# Patient Record
Sex: Male | Born: 1935 | Race: White | Hispanic: No | Marital: Married | Smoking: Former smoker
Health system: Southern US, Community
[De-identification: ages and names within clinical notes are randomized; demographics above are authoritative.]

## PROBLEM LIST (undated history)

## (undated) DIAGNOSIS — N183 Chronic kidney disease, stage 3 unspecified: Secondary | ICD-10-CM

## (undated) DIAGNOSIS — I739 Peripheral vascular disease, unspecified: Secondary | ICD-10-CM

## (undated) DIAGNOSIS — I6529 Occlusion and stenosis of unspecified carotid artery: Secondary | ICD-10-CM

## (undated) DIAGNOSIS — E119 Type 2 diabetes mellitus without complications: Secondary | ICD-10-CM

## (undated) DIAGNOSIS — R0602 Shortness of breath: Secondary | ICD-10-CM

## (undated) DIAGNOSIS — Z9181 History of falling: Secondary | ICD-10-CM

## (undated) DIAGNOSIS — M1990 Unspecified osteoarthritis, unspecified site: Secondary | ICD-10-CM

## (undated) DIAGNOSIS — R001 Bradycardia, unspecified: Secondary | ICD-10-CM

## (undated) DIAGNOSIS — E875 Hyperkalemia: Secondary | ICD-10-CM

## (undated) DIAGNOSIS — R609 Edema, unspecified: Secondary | ICD-10-CM

## (undated) DIAGNOSIS — I639 Cerebral infarction, unspecified: Secondary | ICD-10-CM

## (undated) DIAGNOSIS — I679 Cerebrovascular disease, unspecified: Secondary | ICD-10-CM

## (undated) DIAGNOSIS — I10 Essential (primary) hypertension: Secondary | ICD-10-CM

## (undated) DIAGNOSIS — C801 Malignant (primary) neoplasm, unspecified: Secondary | ICD-10-CM

## (undated) DIAGNOSIS — E785 Hyperlipidemia, unspecified: Secondary | ICD-10-CM

## (undated) HISTORY — DX: Edema, unspecified: R60.9

## (undated) HISTORY — DX: Type 2 diabetes mellitus without complications: E11.9

## (undated) HISTORY — DX: Essential (primary) hypertension: I10

## (undated) HISTORY — PX: FEMORAL ARTERY STENT: SHX1583

## (undated) HISTORY — DX: Hyperkalemia: E87.5

## (undated) HISTORY — DX: Occlusion and stenosis of unspecified carotid artery: I65.29

## (undated) HISTORY — DX: History of falling: Z91.81

## (undated) HISTORY — DX: Bradycardia, unspecified: R00.1

## (undated) HISTORY — DX: Peripheral vascular disease, unspecified: I73.9

## (undated) HISTORY — DX: Hyperlipidemia, unspecified: E78.5

## (undated) HISTORY — DX: Cerebrovascular disease, unspecified: I67.9

---

## 1999-05-09 HISTORY — PX: CAROTID ENDARTERECTOMY: SUR193

## 1999-05-19 ENCOUNTER — Encounter: Payer: Self-pay | Admitting: *Deleted

## 1999-05-20 ENCOUNTER — Inpatient Hospital Stay (HOSPITAL_COMMUNITY): Admission: RE | Admit: 1999-05-20 | Discharge: 1999-05-21 | Payer: Self-pay | Admitting: *Deleted

## 1999-05-20 ENCOUNTER — Encounter (INDEPENDENT_AMBULATORY_CARE_PROVIDER_SITE_OTHER): Payer: Self-pay

## 2006-10-11 ENCOUNTER — Inpatient Hospital Stay (HOSPITAL_COMMUNITY): Admission: EM | Admit: 2006-10-11 | Discharge: 2006-10-12 | Payer: Self-pay | Admitting: Emergency Medicine

## 2006-10-11 ENCOUNTER — Encounter (INDEPENDENT_AMBULATORY_CARE_PROVIDER_SITE_OTHER): Payer: Self-pay | Admitting: Emergency Medicine

## 2007-01-22 ENCOUNTER — Ambulatory Visit: Payer: Self-pay | Admitting: Internal Medicine

## 2007-02-04 ENCOUNTER — Ambulatory Visit: Payer: Self-pay

## 2007-02-04 ENCOUNTER — Encounter: Payer: Self-pay | Admitting: Internal Medicine

## 2007-02-22 ENCOUNTER — Ambulatory Visit: Payer: Self-pay

## 2007-02-25 ENCOUNTER — Ambulatory Visit: Payer: Self-pay | Admitting: Cardiovascular Disease

## 2007-07-09 ENCOUNTER — Ambulatory Visit: Payer: Self-pay | Admitting: Internal Medicine

## 2007-08-15 ENCOUNTER — Ambulatory Visit: Payer: Self-pay | Admitting: Cardiovascular Disease

## 2007-08-15 ENCOUNTER — Ambulatory Visit: Payer: Self-pay

## 2007-08-22 ENCOUNTER — Ambulatory Visit: Payer: Self-pay

## 2007-09-12 ENCOUNTER — Ambulatory Visit: Payer: Self-pay | Admitting: Internal Medicine

## 2007-09-12 LAB — CONVERTED CEMR LAB
Calcium: 9.2 mg/dL (ref 8.4–10.5)
GFR calc Af Amer: 76 mL/min
GFR calc non Af Amer: 63 mL/min
Sodium: 138 meq/L (ref 135–145)

## 2007-09-13 ENCOUNTER — Ambulatory Visit: Payer: Self-pay | Admitting: Cardiology

## 2007-09-18 ENCOUNTER — Ambulatory Visit: Payer: Self-pay | Admitting: Internal Medicine

## 2007-09-18 ENCOUNTER — Ambulatory Visit: Payer: Self-pay | Admitting: Cardiology

## 2007-09-18 LAB — CONVERTED CEMR LAB
Calcium: 9.3 mg/dL (ref 8.4–10.5)
GFR calc Af Amer: 84 mL/min
GFR calc non Af Amer: 69 mL/min
Potassium: 5.6 meq/L — ABNORMAL HIGH (ref 3.5–5.1)
Sodium: 141 meq/L (ref 135–145)

## 2007-09-27 ENCOUNTER — Ambulatory Visit: Payer: Self-pay | Admitting: Internal Medicine

## 2007-09-27 LAB — CONVERTED CEMR LAB
Chloride: 101 meq/L (ref 96–112)
GFR calc Af Amer: 84 mL/min
GFR calc non Af Amer: 69 mL/min
Potassium: 5.3 meq/L — ABNORMAL HIGH (ref 3.5–5.1)

## 2007-10-09 ENCOUNTER — Ambulatory Visit: Payer: Self-pay | Admitting: Internal Medicine

## 2007-11-28 ENCOUNTER — Ambulatory Visit: Payer: Self-pay | Admitting: Internal Medicine

## 2007-12-18 ENCOUNTER — Ambulatory Visit: Payer: Self-pay | Admitting: Internal Medicine

## 2007-12-18 LAB — CONVERTED CEMR LAB
CO2: 29 meq/L (ref 19–32)
Calcium: 8.8 mg/dL (ref 8.4–10.5)
Chloride: 100 meq/L (ref 96–112)
GFR calc non Af Amer: 52 mL/min
Sodium: 136 meq/L (ref 135–145)

## 2008-02-28 ENCOUNTER — Ambulatory Visit: Payer: Self-pay | Admitting: Internal Medicine

## 2008-06-29 ENCOUNTER — Emergency Department (HOSPITAL_COMMUNITY): Admission: EM | Admit: 2008-06-29 | Discharge: 2008-06-29 | Payer: Self-pay | Admitting: Emergency Medicine

## 2008-09-02 ENCOUNTER — Ambulatory Visit: Payer: Self-pay

## 2008-09-02 ENCOUNTER — Encounter: Payer: Self-pay | Admitting: Internal Medicine

## 2008-09-22 DIAGNOSIS — I6529 Occlusion and stenosis of unspecified carotid artery: Secondary | ICD-10-CM

## 2008-09-22 DIAGNOSIS — I10 Essential (primary) hypertension: Secondary | ICD-10-CM | POA: Insufficient documentation

## 2008-09-22 DIAGNOSIS — I498 Other specified cardiac arrhythmias: Secondary | ICD-10-CM

## 2008-09-22 DIAGNOSIS — E785 Hyperlipidemia, unspecified: Secondary | ICD-10-CM

## 2008-09-22 DIAGNOSIS — E119 Type 2 diabetes mellitus without complications: Secondary | ICD-10-CM

## 2008-09-22 DIAGNOSIS — I679 Cerebrovascular disease, unspecified: Secondary | ICD-10-CM

## 2008-09-23 ENCOUNTER — Ambulatory Visit: Payer: Self-pay | Admitting: Cardiovascular Disease

## 2008-09-23 DIAGNOSIS — I70219 Atherosclerosis of native arteries of extremities with intermittent claudication, unspecified extremity: Secondary | ICD-10-CM | POA: Insufficient documentation

## 2008-10-08 ENCOUNTER — Ambulatory Visit: Payer: Self-pay | Admitting: Internal Medicine

## 2009-01-13 ENCOUNTER — Ambulatory Visit: Payer: Self-pay | Admitting: Internal Medicine

## 2009-01-13 ENCOUNTER — Ambulatory Visit: Payer: Self-pay

## 2009-01-13 DIAGNOSIS — I70229 Atherosclerosis of native arteries of extremities with rest pain, unspecified extremity: Secondary | ICD-10-CM | POA: Insufficient documentation

## 2009-01-14 ENCOUNTER — Encounter: Payer: Self-pay | Admitting: Internal Medicine

## 2009-01-15 ENCOUNTER — Encounter: Payer: Self-pay | Admitting: Cardiovascular Disease

## 2009-01-15 ENCOUNTER — Encounter (INDEPENDENT_AMBULATORY_CARE_PROVIDER_SITE_OTHER): Payer: Self-pay

## 2009-01-20 ENCOUNTER — Inpatient Hospital Stay (HOSPITAL_COMMUNITY): Admission: AD | Admit: 2009-01-20 | Discharge: 2009-01-21 | Payer: Self-pay | Admitting: Cardiovascular Disease

## 2009-01-20 ENCOUNTER — Ambulatory Visit: Payer: Self-pay | Admitting: Cardiovascular Disease

## 2009-01-27 ENCOUNTER — Ambulatory Visit: Payer: Self-pay

## 2009-01-29 ENCOUNTER — Ambulatory Visit: Payer: Self-pay

## 2009-01-29 ENCOUNTER — Encounter: Payer: Self-pay | Admitting: Cardiovascular Disease

## 2009-01-29 DIAGNOSIS — I714 Abdominal aortic aneurysm, without rupture: Secondary | ICD-10-CM | POA: Insufficient documentation

## 2009-02-01 ENCOUNTER — Encounter (INDEPENDENT_AMBULATORY_CARE_PROVIDER_SITE_OTHER): Payer: Self-pay

## 2009-02-01 ENCOUNTER — Ambulatory Visit: Payer: Self-pay | Admitting: Cardiovascular Disease

## 2009-02-11 ENCOUNTER — Inpatient Hospital Stay (HOSPITAL_COMMUNITY): Admission: AD | Admit: 2009-02-11 | Discharge: 2009-02-12 | Payer: Self-pay | Admitting: Cardiovascular Disease

## 2009-02-11 ENCOUNTER — Ambulatory Visit: Payer: Self-pay | Admitting: Cardiovascular Disease

## 2009-02-16 ENCOUNTER — Telehealth: Payer: Self-pay | Admitting: Cardiovascular Disease

## 2009-02-17 ENCOUNTER — Telehealth: Payer: Self-pay | Admitting: Cardiovascular Disease

## 2009-03-04 ENCOUNTER — Encounter (INDEPENDENT_AMBULATORY_CARE_PROVIDER_SITE_OTHER): Payer: Self-pay | Admitting: *Deleted

## 2009-03-08 ENCOUNTER — Encounter: Payer: Self-pay | Admitting: Cardiovascular Disease

## 2009-03-09 ENCOUNTER — Ambulatory Visit: Payer: Self-pay | Admitting: Cardiovascular Disease

## 2009-03-09 ENCOUNTER — Ambulatory Visit: Payer: Self-pay

## 2009-05-21 ENCOUNTER — Ambulatory Visit: Payer: Self-pay | Admitting: Internal Medicine

## 2009-05-21 DIAGNOSIS — R609 Edema, unspecified: Secondary | ICD-10-CM | POA: Insufficient documentation

## 2009-06-03 ENCOUNTER — Ambulatory Visit: Payer: Self-pay

## 2009-06-03 ENCOUNTER — Ambulatory Visit: Payer: Self-pay | Admitting: Cardiology

## 2009-06-03 ENCOUNTER — Ambulatory Visit (HOSPITAL_COMMUNITY): Admission: RE | Admit: 2009-06-03 | Discharge: 2009-06-03 | Payer: Self-pay | Admitting: Internal Medicine

## 2009-06-17 ENCOUNTER — Ambulatory Visit: Payer: Self-pay | Admitting: Internal Medicine

## 2009-06-29 ENCOUNTER — Encounter: Payer: Self-pay | Admitting: Cardiovascular Disease

## 2009-09-09 ENCOUNTER — Ambulatory Visit: Payer: Self-pay | Admitting: Internal Medicine

## 2009-09-10 LAB — CONVERTED CEMR LAB
Calcium: 9 mg/dL (ref 8.4–10.5)
Chloride: 105 meq/L (ref 96–112)
Creatinine, Ser: 1.1 mg/dL (ref 0.4–1.5)
Potassium: 5.1 meq/L (ref 3.5–5.1)
Pro B Natriuretic peptide (BNP): 57.3 pg/mL (ref 0.0–100.0)
Sodium: 140 meq/L (ref 135–145)

## 2009-09-15 ENCOUNTER — Ambulatory Visit: Payer: Self-pay | Admitting: Internal Medicine

## 2009-09-20 ENCOUNTER — Encounter: Payer: Self-pay | Admitting: Cardiovascular Disease

## 2009-09-21 ENCOUNTER — Ambulatory Visit: Payer: Self-pay

## 2009-09-21 ENCOUNTER — Ambulatory Visit: Payer: Self-pay | Admitting: Cardiovascular Disease

## 2009-09-21 LAB — CONVERTED CEMR LAB
CO2: 30 meq/L (ref 19–32)
Chloride: 100 meq/L (ref 96–112)
Potassium: 6 meq/L — ABNORMAL HIGH (ref 3.5–5.1)
Pro B Natriuretic peptide (BNP): 58.8 pg/mL (ref 0.0–100.0)
Sodium: 139 meq/L (ref 135–145)

## 2009-10-06 ENCOUNTER — Telehealth: Payer: Self-pay | Admitting: Cardiology

## 2009-10-11 ENCOUNTER — Telehealth: Payer: Self-pay | Admitting: Internal Medicine

## 2009-10-18 ENCOUNTER — Telehealth: Payer: Self-pay | Admitting: Internal Medicine

## 2009-11-07 ENCOUNTER — Emergency Department (HOSPITAL_COMMUNITY): Admission: EM | Admit: 2009-11-07 | Discharge: 2009-11-07 | Payer: Self-pay | Admitting: Emergency Medicine

## 2010-02-01 ENCOUNTER — Encounter: Payer: Self-pay | Admitting: Cardiovascular Disease

## 2010-02-02 ENCOUNTER — Encounter: Payer: Self-pay | Admitting: Cardiovascular Disease

## 2010-02-02 ENCOUNTER — Ambulatory Visit: Payer: Self-pay

## 2010-03-29 ENCOUNTER — Ambulatory Visit: Payer: Self-pay | Admitting: Internal Medicine

## 2010-04-04 ENCOUNTER — Ambulatory Visit: Payer: Self-pay | Admitting: Cardiovascular Disease

## 2010-04-04 ENCOUNTER — Ambulatory Visit: Payer: Self-pay

## 2010-04-07 ENCOUNTER — Telehealth (INDEPENDENT_AMBULATORY_CARE_PROVIDER_SITE_OTHER): Payer: Self-pay | Admitting: Radiology

## 2010-04-11 ENCOUNTER — Encounter (HOSPITAL_COMMUNITY)
Admission: RE | Admit: 2010-04-11 | Discharge: 2010-06-07 | Payer: Self-pay | Source: Home / Self Care | Attending: Internal Medicine | Admitting: Internal Medicine

## 2010-04-11 ENCOUNTER — Ambulatory Visit: Payer: Self-pay

## 2010-04-11 ENCOUNTER — Encounter: Payer: Self-pay | Admitting: Cardiovascular Disease

## 2010-04-11 ENCOUNTER — Ambulatory Visit: Payer: Self-pay | Admitting: Cardiovascular Disease

## 2010-04-28 ENCOUNTER — Ambulatory Visit: Payer: Self-pay | Admitting: Internal Medicine

## 2010-06-05 LAB — CONVERTED CEMR LAB
BUN: 23 mg/dL (ref 6–23)
BUN: 26 mg/dL — ABNORMAL HIGH (ref 6–23)
CO2: 20 meq/L (ref 19–32)
CO2: 29 meq/L (ref 19–32)
CO2: 34 meq/L — ABNORMAL HIGH (ref 19–32)
Calcium: 9.5 mg/dL (ref 8.4–10.5)
Calcium: 9.6 mg/dL (ref 8.4–10.5)
Calcium: 9.6 mg/dL (ref 8.4–10.5)
Chloride: 100 meq/L (ref 96–112)
Chloride: 97 meq/L (ref 96–112)
Cholesterol, target level: 200 mg/dL
Creatinine, Ser: 0.98 mg/dL (ref 0.40–1.50)
Creatinine, Ser: 1.2 mg/dL (ref 0.4–1.5)
GFR calc non Af Amer: 44.75 mL/min (ref >60)
Glucose, Bld: 146 mg/dL — ABNORMAL HIGH (ref 70–99)
Glucose, Bld: 99 mg/dL (ref 70–99)
LDL Goal: 70 mg/dL
Potassium: 4 meq/L (ref 3.5–5.1)
Potassium: 4.6 meq/L (ref 3.5–5.1)
Potassium: 5.6 meq/L — ABNORMAL HIGH (ref 3.5–5.1)
Pro B Natriuretic peptide (BNP): 49.6 pg/mL (ref 0.0–100.0)
Pro B Natriuretic peptide (BNP): 71 pg/mL (ref 0.0–100.0)
Sodium: 138 meq/L (ref 135–145)
Sodium: 140 meq/L (ref 135–145)

## 2010-06-07 NOTE — Assessment & Plan Note (Signed)
Summary: per check out   Primary Provider:  Dr Evlyn Kanner   History of Present Illness: [name] is 77 year old male with history of severe hypertension, hyperlipidemia, diabetes, right bundle-branch block with chronic bradycardia as well as peripheral vascular disease, status post right carotid endarterectomy and bilateral common iliac stenting  CT angiogram has been negative for renal artery stenosis.  His therapy has been limited by his bradycardia as well as hyperkalemia and inability to tolerate several antihypertensives most recently cardura which caused symptomatic hypotension.  Feels pretty good. SBP at home now up in the 150 range. Compliant with all meds  No orthopnea, PND. No CP. HR remains in 50s on monitor at thome but no syncope or presyncope. Legs feel numb/tingly but no significant claudication. Still playing golf 2-3x/week. Edema much improved on metolazone which he takes about 3x/week.   Current Medications (verified): 1)  Glipizide 10 Mg Xr24h-Tab (Glipizide) .... Take 1 Tablet By Mouth Once A Day 2)  Aspirin Ec 325 Mg Tbec (Aspirin) .... Take One Tablet By Mouth Once Daily 3)  Lantus 100 Unit/ml Soln (Insulin Glargine) .... As Directed 4)  Vytorin 10-40 Mg Tabs (Ezetimibe-Simvastatin) .... Take One Tablet By Mouth Dailyat Bedtime 5)  Clonidine Hcl 0.2 Mg Tabs (Clonidine Hcl) .[name].. 1 Tab Two Times A Day 6)  Furosemide 40 Mg Tabs (Furosemide) .... Take One Tablet By Mouth Daily. 7)  Niaspan 1000 Mg Cr-Tabs (Niacin (Antihyperlipidemic)) .... Take 1 Tablet By Mouth Two Times A Day 8)  Amlodipine Besylate 10 Mg Tabs (Amlodipine Besylate) .... Take One Tablet By Mouth Daily 9)  Plavix 75 Mg Tabs (Clopidogrel Bisulfate) .... Take One Tablet By Mouth Daily 10)  Hydralazine Hcl 25 Mg Tabs (Hydralazine Hcl) .... Take One Tablet By Mouth Three Times A Day 11)  Metformin Hcl 1000 Mg Tabs (Metformin Hcl) .... Take 1 Tablet By Mouth Two Times A Day 12)  Lyrica 75 Mg Caps (Pregabalin) .[name]..  1 By Mouth Two Times A Day 13)  Metolazone 2.5 Mg Tabs (Metolazone) .... Take By Mouth As Needed  Allergies (verified): 1)  ! Cardura  Past History:  Past Medical History: Last updated: 06/17/2009 CAROTID STENOSIS (ICD-433.10)    --s/p R carotid endarterectomy   --u/s 4/10  L 40-59% R 0-39%  (stable) HYPERTENSION (ICD-401.9)-Refractory   --CT angio negative for renal artery stenosis   --unable to tolerate minoxidil or doxazosin due to low BP. can't tolerate spironlactone due to hyperkalemia Edema    --echo 1/11: EF 60% grade 1 diastolic dysfunction PAD    --s/p bilateral iliac stenting CEREBROVASCULAR DISEASE (ICD-437.9) HYPERLIPIDEMIA (ICD-272.4) BRADYCARDIA (ICD-427.89) DIABETES MELLITUS, TYPE II (ICD-250.00) Myoview normal 2008 h/o Hyperkalemia  Review of Systems       As per HPI and past medical history; otherwise all systems negative.   Vital Signs:  Patient profile:   77 year old male Height:      71 inches Weight:      184 pounds BMI:     25.76 Pulse rate:   51 / minute Resp:     16 per minute BP sitting:   172 / 52  (right arm)  Vitals Entered By: Marrion Coy, CNA (March 29, 2010 10:50 AM)  Physical Exam  General:  Pt is alert and oriented, in no acute distress. HEENT: normal Neck: normal carotid upstrokes with bilateral bruits,R CEA scar  JVP normal Lungs: CTA CV: Bradycardia regular without murmur+ s4 Abd: soft, NT, positive BS, no bruit, no organomegaly Ext: 1+ bilateral pretibial  edema.  right posterior tibial pulse 2+, other pedal pulses nonpalpable Skin: warm and dry without rash Neuro Ax0 X3. moves all 4 extremities without difficulty   affect pleasant    Impression & Recommendations:  Problem # 1:  EDEMA (ICD-782.3) Improved with metolzaone but still with some fluid on board. Suspect mainly due to venous insuffieincy. Place compression strockings. Check BMET and BNP to make sure renal function stable on metolazone.   Problem # 2:   HYPERTENSION (ICD-401.9) BP elevated. Options limited. Increase hydralazine to 50 three times a day.   Problem # 3:  PAD Stable. Given PAD will need screening for ischemic heart disesase. Will proceed with TM/myoview. May need to switch to [facility] if limited by PAD or can't get to target HR. Will try to walk first.  Other Orders: EKG w/ Interpretation (93000) Nuclear Stress Test (Nuc Stress Test) TLB-BMP (Basic Metabolic Panel-BMET) (80048-METABOL) TLB-BNP (B-Natriuretic Peptide) (83880-BNPR)  Patient Instructions: 1)  Labwork today: bmet/bnp (782.3). 2)  Increase Hydralazine to 50mg  three times a day. 3)  Please obtain and wear a pair of Over the Counter compression socks. 4)  Your physician has requested that you have an exercise stress myoview.  For further information please visit [email or web address]  Please follow instruction sheet, as given. 5)  Your physician recommends that you schedule a follow-up appointment in: 4-6 weeks.

## 2010-06-07 NOTE — Assessment & Plan Note (Signed)
Summary: PER CHECK OUT/SF   Primary Provider:  Dr Evlyn Kanner  CC:  SOB occasionally.  History of Present Illness: [name] is 77 year old male with history of severe hypertension, hyperlipidemia, diabetes, right bundle-branch block with chronic bradycardia as well as peripheral vascular disease, status post right carotid endarterectomy and bilateral common iliac stenting  CT angiogram has been negative for renal artery stenosis.  His therapy has been limited by his bradycardia as well as hyperkalemia and inability to tolerate several antihypertensives most recently cardura which caused symptomatic hypotension.  Has had total resolution of claudication but still has numbness in his legs.  SBP at home ranging 140 to 170. No CP. Occasionally SOB when laying back in easy chair but not while actice. Still playing golf without problems when weather cooperates. No syncope or presyncope.  Having worse problems with edema. Hyzaar stopped prior to angio a couple months ago and not restarted.   Current Medications (verified): 1)  Glipizide 10 Mg Xr24h-Tab (Glipizide) .... Take 1 Tablet By Mouth Once A Day 2)  Aspirin Ec 325 Mg Tbec (Aspirin) .... Take One Tablet By Mouth Once Daily 3)  Lantus 100 Unit/ml Soln (Insulin Glargine) .... As Directed 4)  Vytorin 10-40 Mg Tabs (Ezetimibe-Simvastatin) .... Take One Tablet By Mouth Dailyat Bedtime 5)  Clonidine Hcl 0.2 Mg Tabs (Clonidine Hcl) .[name].. 1 Tab Two Times A Day 6)  Furosemide 20 Mg Tabs (Furosemide) .... Take One Tablet By Mouth Daily. 7)  Niaspan 1000 Mg Cr-Tabs (Niacin (Antihyperlipidemic)) .... Take 1 Tablet By Mouth Two Times A Day 8)  Amlodipine Besylate 10 Mg Tabs (Amlodipine Besylate) .... Take One Tablet By Mouth Daily 9)  Plavix 75 Mg Tabs (Clopidogrel Bisulfate) .... Take One Tablet By Mouth Daily 10)  Hydralazine Hcl 25 Mg Tabs (Hydralazine Hcl) .... Take One Tablet By Mouth Three Times A Day 11)  Aspirin 81 Mg Tbec (Aspirin) .... Take One  Tablet By Mouth Daily  Allergies (verified): 1)  ! Cardura  Past History:  Past Medical History: CAROTID STENOSIS (ICD-433.10)    --s/p R carotid endarterectomy   --u/s 4/10  L 40-59% R 0-39%  (stable) HYPERTENSION (ICD-401.9)-Refractory   --CT angio negative for renal artery stenosis   --unable to tolerate minoxidil or doxazosin due to low BP. can't tolerate spironlactone due to hyperkalemia PAD    --s/p bilateral iliac stenting CEREBROVASCULAR DISEASE (ICD-437.9) HYPERLIPIDEMIA (ICD-272.4) BRADYCARDIA (ICD-427.89) DIABETES MELLITUS, TYPE II (ICD-250.00) Myoview normal 2008 h/o Hyperkalemia  Review of Systems       As per HPI and past medical history; otherwise all systems negative.   Vital Signs:  Patient profile:   77 year old male Height:      71 inches Weight:      191 pounds BMI:     26.74 Pulse rate:   48 / minute BP sitting:   204 / 64  (right arm) Cuff size:   regular  Vitals Entered By: Hardin Negus, RMA (May 21, 2009 9:59 AM)  Physical Exam  General:  Pt is alert and oriented, in no acute distress. HEENT: normal Neck: normal carotid upstrokes with bilateral bruits, JVP 7. Lungs: CTA CV: PMI non-displaced. RRR/ 2/6 SEM RSB. no gallop Abd: soft, NT, positive BS, no bruit, no organomegaly Ext: 2+ bilateral pedal edema.no cyanosis or clubbin Skin: warm and dry without rash    Impression & Recommendations:  Problem # 1:  HYPERTENSION (ICD-401.9) BP markedly elevated. Will restart Hyzaar and see how he does. Suspect he also has  white-coat compenent. Bring him back in 1 month and if BP still up consider ambulatory BP monitor. May try to restart very low-dose cardura in future.  Orders: TLB-BMP (Basic Metabolic Panel-BMET) (80048-METABOL) TLB-BNP (B-Natriuretic Peptide) (83880-BNPR)  Problem # 2:  EDEMA (ICD-782.3) Restart Hyzaar. Check echo and BNP. Likely has significant diastolic dysfunction.  Orders: TLB-BMP (Basic Metabolic Panel-BMET)  (80048-METABOL) TLB-BNP (B-Natriuretic Peptide) (83880-BNPR) Echocardiogram (Echo)  Problem # 3:  BRADYCARDIA (ICD-427.89) Remains asymptomatic. Has significant underlying conduction disease and will likely need pacer at some point in future.  Other Orders: EKG w/ Interpretation (93000)  Patient Instructions: 1)  Start Hyzaar 100/25mg  daily 2)  Labs today 3)  Your physician has requested that you have an echocardiogram.  Echocardiography is a painless test that uses sound waves to create images of your heart. It provides your doctor with information about the size and shape of your heart and how well your heart's chambers and valves are working.  This procedure takes approximately one hour. There are no restrictions for this procedure. 4)  Follow up in 46month Prescriptions: HYZAAR 100-25 MG TABS (LOSARTAN POTASSIUM-HCTZ) Take 1 tablet by mouth once a day  #90 x 3   Entered by:   [name], RN   Authorized by:   Dolores Patty, MD, [facility]   Signed by:   [name], RN on 05/21/2009   Method used:   Faxed to ...       [facility] [employer] education [facility])       [address] [address]       [place], [place]  [address]       Ph: [phone]       Fax: [phone]   RxID:   [id number] HYZAAR 100-25 MG TABS (LOSARTAN POTASSIUM-HCTZ) Take 1 tablet by mouth once a day  #30 x 1   Entered by:   [name], RN   Authorized by:   Dolores Patty, MD, [facility]   Signed by:   [name], RN on 05/21/2009   Method used:   Electronically to        [id number] (retail)       [address]       [place], [place]  [address]       Ph: [phone] or [phone]       Fax: [phone]   RxID:   [id number]   Prevention & Chronic Care Immunizations   Influenza vaccine: Not documented    Tetanus booster: Not documented    Pneumococcal vaccine: Not documented    H. zoster vaccine: Not documented  Colorectal Screening   Hemoccult: Not documented     Colonoscopy: Not documented  Other Screening   PSA: Not documented   Smoking status: Not documented  Diabetes Mellitus   HgbA1C: Not documented    Eye exam: Not documented    Foot exam: Not documented   High risk foot: Not documented   Foot care education: Not documented    Urine microalbumin/creatinine ratio: Not documented  Lipids   Total Cholesterol: Not documented   LDL: Not documented   LDL Direct: Not documented   HDL: Not documented   Triglycerides: Not documented    SGOT (AST): Not documented   SGPT (ALT): Not documented   Alkaline phosphatase: Not documented   Total bilirubin: Not documented  Hypertension   Last Blood Pressure: 204 / 64  (05/21/2009)   Serum creatinine: 1.6  (01/13/2009)   Serum  potassium 5.6  (01/13/2009)  Self-Management Support :    Diabetes self-management support: Not documented    Hypertension self-management support: Not documented    Lipid self-management support: Not documented    Appended Document: Orders Update    Clinical Lists Changes  Orders: Added new Test order of T- * Misc. Laboratory test [phone]) - Signed

## 2010-06-07 NOTE — Assessment & Plan Note (Signed)
Summary: per check out/sf   Primary Provider:  Dr Evlyn Kanner  CC:  SOB occasionally.  History of Present Illness: [name] is 77 year old male with history of severe hypertension, hyperlipidemia, diabetes, right bundle-branch block with chronic bradycardia as well as peripheral vascular disease, status post right carotid endarterectomy and bilateral common iliac stenting  CT angiogram has been negative for renal artery stenosis.  His therapy has been limited by his bradycardia as well as hyperkalemia and inability to tolerate several antihypertensives most recently cardura which caused symptomatic hypotension.  At last visit he had significant volume overload and was quite hypertensive. We restarted hyzaar and got echo and bnp. BNP only 71. Echo EF 60% with grade 1 diastolic dysfx. Edema much improved. Denies CP, SOB. Playing golf 2-3x/week. Claudication resolved but still with numb feet. BP at home running 125-140.  Current Medications (verified): 1)  Glipizide 10 Mg Xr24h-Tab (Glipizide) .... Take 1 Tablet By Mouth Once A Day 2)  Aspirin Ec 325 Mg Tbec (Aspirin) .... Take One Tablet By Mouth Once Daily 3)  Lantus 100 Unit/ml Soln (Insulin Glargine) .... As Directed 4)  Vytorin 10-40 Mg Tabs (Ezetimibe-Simvastatin) .... Take One Tablet By Mouth Dailyat Bedtime 5)  Clonidine Hcl 0.2 Mg Tabs (Clonidine Hcl) .[name].. 1 Tab Two Times A Day 6)  Furosemide 20 Mg Tabs (Furosemide) .... Take One Tablet By Mouth Daily. 7)  Niaspan 1000 Mg Cr-Tabs (Niacin (Antihyperlipidemic)) .... Take 1 Tablet By Mouth Two Times A Day 8)  Amlodipine Besylate 10 Mg Tabs (Amlodipine Besylate) .... Take One Tablet By Mouth Daily 9)  Plavix 75 Mg Tabs (Clopidogrel Bisulfate) .... Take One Tablet By Mouth Daily 10)  Hydralazine Hcl 25 Mg Tabs (Hydralazine Hcl) .... Take One Tablet By Mouth Three Times A Day 11)  Hyzaar 100-25 Mg Tabs (Losartan Potassium-Hctz) .... Take 1 Tablet By Mouth Once A Day 12)  Metformin Hcl 1000 Mg  Tabs (Metformin Hcl) .... Once Daily  Allergies (verified): 1)  ! Cardura  Past History:  Past Medical History: CAROTID STENOSIS (ICD-433.10)    --s/p R carotid endarterectomy   --u/s 4/10  L 40-59% R 0-39%  (stable) HYPERTENSION (ICD-401.9)-Refractory   --CT angio negative for renal artery stenosis   --unable to tolerate minoxidil or doxazosin due to low BP. can't tolerate spironlactone due to hyperkalemia Edema    --echo 1/11: EF 60% grade 1 diastolic dysfunction PAD    --s/p bilateral iliac stenting CEREBROVASCULAR DISEASE (ICD-437.9) HYPERLIPIDEMIA (ICD-272.4) BRADYCARDIA (ICD-427.89) DIABETES MELLITUS, TYPE II (ICD-250.00) Myoview normal 2008 h/o Hyperkalemia  Review of Systems       As per HPI and past medical history; otherwise all systems negative.   Vital Signs:  Patient profile:   77 year old male Height:      71 inches Weight:      191 pounds BMI:     26.74 Pulse rate:   60 / minute BP sitting:   154 / 60  (left arm) Cuff size:   regular  Vitals Entered By: Hardin Negus, RMA (June 17, 2009 9:32 AM)  Physical Exam  General:  Pt is alert and oriented, in no acute distress. HEENT: normal Neck: normal carotid upstrokes with bilateral bruits, R CEA scarJVP  flat Lungs: CTA CV: PMI non-displaced. RRR/ 2/6 SEM RSB. no gallop Abd: soft, NT, positive BS, no bruit, no organomegaly Ext: 1+ bilateral pedal edema.no cyanosis or clubbing no sores Skin: warm and dry without rash    Impression & Recommendations:  Problem # 1:  HYPERTENSION (ICD-401.9) SBP ranging 160-180 here in clinic. 125-140 at home. Wil get ambulatory BP monitor to get better feel of actualt BPs. If SBP routinely over 135 would consider restarting doxazosin at 0.5 mg at bedtime to see if he can tolerate.  Problem # 2:  EDEMA (ICD-782.3) Improved. Continue current regiment.   Problem # 3:  CAROTID STENOSIS (ICD-433.10) Stable. Asymptomatic. Due for u/s at next visit.   Other  Orders: Ambulatory Bloodpressure Cuff (Amb BP Cuff)  Patient Instructions: 1)  Ambulatory BP cuff 2)  Follow up in 3 months

## 2010-06-07 NOTE — Progress Notes (Signed)
Summary: needs new dose called in  Phone Note Refill Request Message from:  Patient on [facility]  Refills Requested: Medication #1:  FUROSEMIDE 40 MG TABS Take one tablet by mouth daily. he needs it sent in because it was a dose change  Initial call taken by: [name],  October 11, 2009 3:30 PM  Follow-up for Phone Call        already sent to the pharmacy. 10-06-09 Follow-up by: Kem Parkinson,  October 11, 2009 3:51 PM

## 2010-06-07 NOTE — Letter (Signed)
Summary: [facility] Office Note  [facility] Office Note   Imported By: Roderic Ovens 07/19/2009 11:20:28  _____________________________________________________________________  External Attachment:    Type:   Image     Comment:   External Document

## 2010-06-07 NOTE — Progress Notes (Signed)
Summary: nuc pre-procedure  Phone Note Outgoing Call   Call placed by: Harlow Asa CNMT Call placed to: Patient Reason for Call: Confirm/change Appt Summary of Call: Left message with information on Myoview Information Sheet (see scanned document for details).      Nuclear Med Background Indications for Stress Test: Evaluation for Ischemia   History: Echo, Myocardial Perfusion Study, Stents  History Comments: 1/11 Echo - nml/ EF=60-65%; MPI -nml EF=60%; Stent-Iliac     Nuclear Pre-Procedure Cardiac Risk Factors: Carotid Disease, History of Smoking, Hypertension, IDDM Type 2, Lipids, PVD, RBBB Height (in): 71 Tech Comments: Rt. CEA   Appended Document: Cardiology Nuclear Testing ok

## 2010-06-07 NOTE — Assessment & Plan Note (Signed)
Summary: f80m   Visit Type:  Follow-up Primary Provider:  Dr Evlyn Kanner  CC:  Left foot pain.  History of Present Illness: [name] returns for followup of his peripheral arterial disease today. He is a 77 year old gentleman with extensive PAD. He underwent iliac stenting in 2010, and he has residual SFA and popliteal stenosis. Previous ABIs were 71% on the right and 52% on the left. Today's study showed ABI's of 58% on the right and 50% on the left.  He has bilateral calf pain with ambulation, stable over time. He is able to walk about 200 yards without resting. He can play golf without limitation, even when it is 'cart path only.' His main complaint today is severe pain in the left first toe (1st MT joint), which has been painful and swollen for about 2 months. No ulcers or other complaints.    Current Medications (verified): 1)  Glipizide 10 Mg Xr24h-Tab (Glipizide) .... Take 1 Tablet By Mouth Once A Day 2)  Aspirin Ec 325 Mg Tbec (Aspirin) .... Take One Tablet By Mouth Once Daily 3)  Lantus 100 Unit/ml Soln (Insulin Glargine) .... As Directed 4)  Vytorin 10-40 Mg Tabs (Ezetimibe-Simvastatin) .... Take One Tablet By Mouth Dailyat Bedtime 5)  Clonidine Hcl 0.2 Mg Tabs (Clonidine Hcl) .[name].. 1 Tab Two Times A Day 6)  Furosemide 40 Mg Tabs (Furosemide) .... Take One Tablet By Mouth Daily. 7)  Niaspan 1000 Mg Cr-Tabs (Niacin (Antihyperlipidemic)) .... Take 1 Tablet By Mouth Two Times A Day 8)  Amlodipine Besylate 10 Mg Tabs (Amlodipine Besylate) .... Take One Tablet By Mouth Daily 9)  Plavix 75 Mg Tabs (Clopidogrel Bisulfate) .... Take One Tablet By Mouth Daily 10)  Hydralazine Hcl 50 Mg Tabs (Hydralazine Hcl) .... Take One Tablet By Mouth Three Times A Day 11)  Metformin Hcl 1000 Mg Tabs (Metformin Hcl) .... Take 1 Tablet By Mouth Two Times A Day 12)  Lyrica 75 Mg Caps (Pregabalin) .[name].. 1 By Mouth Two Times A Day 13)  Metolazone 2.5 Mg Tabs (Metolazone) .... Take By Mouth As  Needed  Allergies: 1)  ! Cardura  Past History:  Past medical history reviewed for relevance to current acute and chronic problems.  Past Medical History: Reviewed history from 06/17/2009 and no changes required. CAROTID STENOSIS (ICD-433.10)    --s/p R carotid endarterectomy   --u/s 4/10  L 40-59% R 0-39%  (stable) HYPERTENSION (ICD-401.9)-Refractory   --CT angio negative for renal artery stenosis   --unable to tolerate minoxidil or doxazosin due to low BP. can't tolerate spironlactone due to hyperkalemia Edema    --echo 1/11: EF 60% grade 1 diastolic dysfunction PAD    --s/p bilateral iliac stenting CEREBROVASCULAR DISEASE (ICD-437.9) HYPERLIPIDEMIA (ICD-272.4) BRADYCARDIA (ICD-427.89) DIABETES MELLITUS, TYPE II (ICD-250.00) Myoview normal 2008 h/o Hyperkalemia  Review of Systems       Negative except as per HPI   Vital Signs:  Patient profile:   77 year old male Height:      71 inches Weight:      189 pounds BMI:     26.46 Pulse rate:   54 / minute Pulse rhythm:   irregular Resp:     18 per minute BP sitting:   200 / 78  (left arm) Cuff size:   large  Vitals Entered By: Vikki Ports (April 04, 2010 2:40 PM)  Serial Vital Signs/Assessments:  Time      Position  BP       Pulse  Resp  Temp  By           R Arm     198/78                         Vikki Ports   Physical Exam  General:  Pt is alert and oriented, in no acute distress. HEENT: normal Neck: normal carotid upstrokes without bruits, JVP normal Lungs: CTA CV: RRR without murmur or gallop Abd: soft, NT, positive BS, no bruit, no organomegaly Ext: no clubbing, cyanosis, or edema. femoral pulses 2+=, pedals not palpable. 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 There is no evidence of inflow disease after iliac stenting, but the pt does have bilateral infrainguinal disease. With stable intermittent claudication, I have recommended continued observation and medical therapy. His right foot pain is consistent with gout as it is localized to the first MT joint. I called Dr Evlyn Kanner who kindly offered to give [name] a depomedrol injection - he will go to the [facility] this afternoon.  He will have followup ABI's and duplex in one year.  Problem # 2:  CAROTID STENOSIS (ICD-433.10) Stable, mild-moderate carotid disease. Due for followup in May 2012.  His updated medication list for this problem includes:    Aspirin Ec 325 Mg Tbec (Aspirin) .[name]... Take one tablet by mouth once daily    Plavix 75 Mg Tabs (Clopidogrel bisulfate) .[name]... Take one tablet by mouth daily  Problem # 3:  HYPERTENSION (ICD-401.9) BP labile and difficult to manage because of med intolerances. He reports BP was 145/70 this am. Dr Gala Romney just increased hydralazine last week. No changes made today.  His updated medication list for this problem includes:    Aspirin Ec 325 Mg Tbec (Aspirin) .[name]... Take one tablet by mouth once daily    Clonidine Hcl 0.2 Mg Tabs (Clonidine hcl) .[name]... 1 tab two times a day    Furosemide 40 Mg Tabs (Furosemide) .[name]... Take one tablet by mouth daily.    Amlodipine Besylate 10 Mg Tabs (Amlodipine besylate) .[name]... Take one tablet by mouth daily    Hydralazine Hcl 50 Mg Tabs (Hydralazine hcl) .[name]... Take one tablet by mouth three times a day    Metolazone 2.5 Mg Tabs (Metolazone) .[name]... Take by mouth as needed  Patient Instructions: 1)  Your physician recommends that you schedule a follow-up appointment in: 1 year with Dr. Excell Seltzer 2)  Your physician recommends that you continue on your current medications as directed. Please refer to  the Current Medication list given to you today. 3)  Your physician has requested that you have an ankle brachial index (ABI). During this test an ultrasound and blood pressure cuff are used to evaluate the arteries that supply the arms and legs with blood. Allow thirty minutes for this exam. There are no restrictions or special instructions.  IN 1 YEAR 4)  Your physician has requested that you have a carotid duplex. This test is an ultrasound of the carotid arteries in your neck. It looks at blood flow through these arteries that supply the brain with blood. Allow one hour for this exam. There are  no restrictions or special instructions.  IN MAY 2012

## 2010-06-07 NOTE — Assessment & Plan Note (Signed)
Summary: 3 MONTH ROV/SL   Visit Type:  Follow-up Primary Provider:  Dr Evlyn Kanner  CC:  edema.  History of Present Illness: [name] is 77 year old male with history of severe hypertension, hyperlipidemia, diabetes, right bundle-branch block with chronic bradycardia as well as peripheral vascular disease, status post right carotid endarterectomy and bilateral common iliac stenting  CT angiogram has been negative for renal artery stenosis.  His therapy has been limited by his bradycardia as well as hyperkalemia and inability to tolerate several antihypertensives most recently cardura which caused symptomatic hypotension.  Returns for f/u. Saw Dr. Evlyn Kanner about 3 or 4 months ago and was switched from Hyzaar to Tekturna/HCT and Lyrica was added. Says he is swelling again. Also has rash on his thighs which comes and goes.   SBP at home  120-130s mostly. No orthopnea, PND. No CP. HR remains in 50s on monitor at thome but no syncope or presyncope. Legs feel numb/tingly but no claudication. Still playing golf 2x/week  Current Medications (verified): 1)  Glipizide 10 Mg Xr24h-Tab (Glipizide) .... Take 1 Tablet By Mouth Once A Day 2)  Aspirin Ec 325 Mg Tbec (Aspirin) .... Take One Tablet By Mouth Once Daily 3)  Lantus 100 Unit/ml Soln (Insulin Glargine) .... As Directed 4)  Vytorin 10-40 Mg Tabs (Ezetimibe-Simvastatin) .... Take One Tablet By Mouth Dailyat Bedtime 5)  Clonidine Hcl 0.2 Mg Tabs (Clonidine Hcl) .[name].. 1 Tab Two Times A Day 6)  Furosemide 20 Mg Tabs (Furosemide) .... Take One Tablet By Mouth Daily. 7)  Niaspan 1000 Mg Cr-Tabs (Niacin (Antihyperlipidemic)) .... Take 1 Tablet By Mouth Two Times A Day 8)  Amlodipine Besylate 10 Mg Tabs (Amlodipine Besylate) .... Take One Tablet By Mouth Daily 9)  Plavix 75 Mg Tabs (Clopidogrel Bisulfate) .... Take One Tablet By Mouth Daily 10)  Hydralazine Hcl 25 Mg Tabs (Hydralazine Hcl) .... Take One Tablet By Mouth Three Times A Day 11)  Tekturna Hct  300-12.5 Mg Tabs (Aliskiren-Hydrochlorothiazide) .[name].. 1 By Mouth Daily 12)  Metformin Hcl 1000 Mg Tabs (Metformin Hcl) .... Once Daily 13)  Lyrica 75 Mg Caps (Pregabalin) .[name].. 1 By Mouth Two Times A Day  Allergies (verified): 1)  ! Cardura  Past History:  Past Medical History: Last updated: 06/17/2009 CAROTID STENOSIS (ICD-433.10)    --s/p R carotid endarterectomy   --u/s 4/10  L 40-59% R 0-39%  (stable) HYPERTENSION (ICD-401.9)-Refractory   --CT angio negative for renal artery stenosis   --unable to tolerate minoxidil or doxazosin due to low BP. can't tolerate spironlactone due to hyperkalemia Edema    --echo 1/11: EF 60% grade 1 diastolic dysfunction PAD    --s/p bilateral iliac stenting CEREBROVASCULAR DISEASE (ICD-437.9) HYPERLIPIDEMIA (ICD-272.4) BRADYCARDIA (ICD-427.89) DIABETES MELLITUS, TYPE II (ICD-250.00) Myoview normal 2008 h/o Hyperkalemia  Vital Signs:  Patient profile:   77 year old male Height:      71 inches Weight:      194 pounds BMI:     27.16 Pulse rate:   46 / minute Resp:     16 per minute BP sitting:   160 / 58  (right arm)  Vitals Entered By: Marrion Coy, CNA (Sep 09, 2009 8:02 AM)  Physical Exam  General:  Pt is alert and oriented, in no acute distress. HEENT: normal Neck: normal carotid upstrokes with bilateral bruits, R CEA scarJVP  flat Lungs: CTA CV: PMI non-displaced. Bradycardic 2/6 SEM RSB. no gallop Abd: soft, NT, positive BS, no bruit, no organomegaly Ext: 3+ bilateral edema up  through thighs.no cyanosis or clubbing no sores Skin: warm and dry without rash    Impression & Recommendations:  Problem # 1:  EDEMA (ICD-782.3) Marked peripheral edema today. Will increase lasix to 40 daily. Jump start with zaroxolyn 2.5 mg x 1 today.. Check labs today and next week. Will not add kcl as he has had problem with hyperkalemia in past. He has f/u with Dr. Evlyn Kanner in 2 weeks.  Problem # 2:  HYPERTENSION (ICD-401.9) BP up today but well  controlled at home. Continue to follow.  Problem # 3:  CAROTID STENOSIS (ICD-433.10) Asymptomatic. Due for f/u ultrasound.  Problem # 4:  BRADYCARDIA (ICD-427.89) Remains asymptomatic. Will continue to follow. Will likely need pacemaker down the road but no indication at this point.   Other Orders: EKG w/ Interpretation (93000) Carotid Duplex (Carotid Duplex) TLB-BMP (Basic Metabolic Panel-BMET) (80048-METABOL) TLB-BNP (B-Natriuretic Peptide) (83880-BNPR)  Patient Instructions: 1)  Your physician recommends that you schedule a follow-up appointment in: 6 months 2)  Your physician recommends that you return for lab work on Tuesday at time of appt with Dr. Excell Seltzer Sep 14, 2009  BMP,BNP 401.1 3)  Your physician has recommended you make the following change in your medication:  4)  Increase furosemide to 40 mg by mouth daily. 5)  Start metolazone 2. 5 mg by mouth as needed 6)  Your physician has requested that you have a carotid duplex. This test is an ultrasound of the carotid arteries in your neck. It looks at blood flow through these arteries that supply the brain with blood. Allow one hour for this exam. There are no restrictions or special instructions. Prescriptions: METOLAZONE 2.5 MG TABS (METOLAZONE) take by mouth as needed  #10 x 3   Entered by:   Dossie Arbour, RN, BSN   Authorized by:   Dolores Patty, MD, [facility]   Signed by:   Dossie Arbour, RN, BSN on 09/09/2009   Method used:   Electronically to        [id number] (retail)       [address]       [place], [place]  [address]       Ph: [phone] or [phone]       Fax: [phone]   RxID:   [id number]

## 2010-06-07 NOTE — Assessment & Plan Note (Signed)
Summary: ROV   Visit Type:  Follow-up Primary Provider:  Dr Evlyn Kanner  CC:  Feet and legs edema. Right big toe sharp pain.  History of Present Illness: [name] returns for followup of his peripheral arterial disease today. He is a 77 year old gentleman with extensive PAD. He underwent iliac stenting last year, and he has residual SFA and popliteal stenosis. ABIs were performed today with readings of 71% on the right and 52% on the left. Previous ABIs were 79% on the right at 58% on the left.  He complains of bilateral calf with moderate walking.  He is asymptomatic with playing golf. He has bilateral leg numbness from the knees to the feet, and is treated with Lyrica for neuropathic pain. He denies hip or buttock claudication. He denies rest pain or ischemic ulceration.  He also complains of leg swelling, improving after starting metolazone last week.  Current Medications (verified): 1)  Glipizide 10 Mg Xr24h-Tab (Glipizide) .... Take 1 Tablet By Mouth Once A Day 2)  Aspirin Ec 325 Mg Tbec (Aspirin) .... Take One Tablet By Mouth Once Daily 3)  Lantus 100 Unit/ml Soln (Insulin Glargine) .... As Directed 4)  Vytorin 10-40 Mg Tabs (Ezetimibe-Simvastatin) .... Take One Tablet By Mouth Dailyat Bedtime 5)  Clonidine Hcl 0.2 Mg Tabs (Clonidine Hcl) .[name].. 1 Tab Two Times A Day 6)  Furosemide 40 Mg Tabs (Furosemide) .... Take One Tablet By Mouth Daily. 7)  Niaspan 1000 Mg Cr-Tabs (Niacin (Antihyperlipidemic)) .... Take 1 Tablet By Mouth Two Times A Day 8)  Amlodipine Besylate 10 Mg Tabs (Amlodipine Besylate) .... Take One Tablet By Mouth Daily 9)  Plavix 75 Mg Tabs (Clopidogrel Bisulfate) .... Take One Tablet By Mouth Daily 10)  Hydralazine Hcl 25 Mg Tabs (Hydralazine Hcl) .... Take One Tablet By Mouth Three Times A Day 11)  Tekturna Hct 300-12.5 Mg Tabs (Aliskiren-Hydrochlorothiazide) .[name].. 1 By Mouth Daily 12)  Metformin Hcl 1000 Mg Tabs (Metformin Hcl) .... Take 1 Tablet By Mouth Two Times A  Day 13)  Lyrica 75 Mg Caps (Pregabalin) .[name].. 1 By Mouth Two Times A Day 14)  Metolazone 2.5 Mg Tabs (Metolazone) .... Take By Mouth As Needed  Allergies: 1)  ! Cardura  Past History:  Past medical history reviewed for relevance to current acute and chronic problems.  Past Medical History: Reviewed history from 06/17/2009 and no changes required. CAROTID STENOSIS (ICD-433.10)    --s/p R carotid endarterectomy   --u/s 4/10  L 40-59% R 0-39%  (stable) HYPERTENSION (ICD-401.9)-Refractory   --CT angio negative for renal artery stenosis   --unable to tolerate minoxidil or doxazosin due to low BP. can't tolerate spironlactone due to hyperkalemia Edema    --echo 1/11: EF 60% grade 1 diastolic dysfunction PAD    --s/p bilateral iliac stenting CEREBROVASCULAR DISEASE (ICD-437.9) HYPERLIPIDEMIA (ICD-272.4) BRADYCARDIA (ICD-427.89) DIABETES MELLITUS, TYPE II (ICD-250.00) Myoview normal 2008 h/o Hyperkalemia  Review of Systems       Negative except as per HPI   Vital Signs:  Patient profile:   77 year old male Height:      71 inches Weight:      190.50 pounds BMI:     26.67 Pulse rate:   54 / minute Pulse rhythm:   irregular Resp:     18 per minute BP sitting:   160 / 60  (left arm) Cuff size:   large  Vitals Entered By: Vikki Ports (Sep 21, 2009 11:58 AM)  Serial Vital Signs/Assessments:  Time  Position  BP       Pulse  Resp  Temp     By           R Arm     160/60                         Vikki Ports   Physical Exam  General:  Pt is alert and oriented, in no acute distress. HEENT: normal Neck: normal carotid upstrokes with bilateral bruits, JVP normal Lungs: CTA CV: RRR without murmur or gallop Abd: soft, NT, positive BS, no bruit, no organomegaly Ext: 1+ bilateral pretibial edema.  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 He has strong femoral pulses and no indication of recurrent inflow disease. I suspect his symptoms are from SFA/popliteal stenosis. Will reassess with duplex ultrasound in 6 months. He was advised to call if progressive claudication symptoms. At present he is not very limited by his claudication.  Recommend continued dual antiplatelet therapy with aspirin and Plavix.  Orders: TLB-BMP (Basic Metabolic Panel-BMET) (80048-METABOL)  Problem # 2:  CAROTID STENOSIS (ICD-433.10) Asymptomatic, on appropriate medical therapy, carotid duplex as above with stable disease. Should have a followup duplex in one year.  His updated medication list for this problem includes:    Aspirin Ec 325 Mg Tbec (Aspirin) .[name]... Take one tablet by mouth once daily    Plavix 75 Mg Tabs (Clopidogrel bisulfate) .[name]... Take one tablet by mouth daily  Problem # 3:  HYPERTENSION (ICD-401.9) Complicated by intolerance to multiple medications. Hyperkalemia has been a big problem with med tolerance. Will recheck a metabolic panel today now he is off of externa and using as needed metolazone. He follows up with Dr. Evlyn Kanner next week.  His updated medication list for this problem includes:    Aspirin Ec 325 Mg Tbec (Aspirin) .[name]... Take one tablet by mouth once daily    Clonidine Hcl 0.2 Mg Tabs (Clonidine hcl) .[name]... 1 tab two times a day    Furosemide 40 Mg Tabs (Furosemide) .[name]... Take one tablet by mouth daily.    Amlodipine Besylate 10 Mg Tabs (Amlodipine besylate) .[name]... Take one tablet by mouth daily    Hydralazine Hcl 25 Mg Tabs  (Hydralazine hcl) .[name]... Take one tablet by mouth three times a day    Tekturna Hct 300-12.5 Mg Tabs (Aliskiren-hydrochlorothiazide) .[name]... 1 by mouth daily    Metolazone 2.5 Mg Tabs (Metolazone) .[name]... Take by mouth as needed  Patient Instructions: 1)  Your physician recommends that you have lab work today: BMP 2)  Your physician recommends that you continue on your current medications as directed. Please refer to the Current Medication list given to you today. 3)  Your physician wants you to follow-up in:  6 MONTHS.  You will receive a reminder letter in the mail two months in advance. If you don't receive a letter, please call our office to schedule the follow-up appointment. 4)  Your physician has  requested that you have a lower extremity arterial duplex in 6 MONTHS.  This test is an ultrasound of the arteries in the legs.  It looks at arterial blood flow in the legs.  Allow one hour for Lower Arterial scans. There are no restrictions or special instructions. 5)  Your physician has requested that you have an ankle brachial index (ABI) in 6 MONTHS. During this test an ultrasound and blood pressure cuff are used to evaluate the arteries that supply the arms and legs with blood. Allow thirty minutes for this exam. There are no restrictions or special instructions.

## 2010-06-07 NOTE — Miscellaneous (Signed)
Summary: Orders Update  Clinical Lists Changes  Orders: Added new Test order of Abdominal Aorta Duplex (Abd Aorta Duplex) - Signed 

## 2010-06-07 NOTE — Progress Notes (Signed)
Summary: pt mailing you a letter  Phone Note Call from Patient Call back at Home Phone [phone]   Caller: Patient Reason for Call: Talk to Nurse, Talk to Doctor Summary of Call: pt got a letter from [facility] and he wants to talk to you about and he is mailing the letter to you Initial call taken by: [name],  October 18, 2009 2:57 PM  Follow-up for Phone Call        pt states he received letter about his amlodipine and he didn't understand what it meant, he mailed the letter to me 6/14, will look for and f/u on [name], RN  October 20, 2009 5:29 PM

## 2010-06-07 NOTE — Miscellaneous (Signed)
Summary: Orders Update  Clinical Lists Changes  Orders: Added new Test order of Arterial Duplex Lower Extremity (Arterial Duplex Low) - Signed 

## 2010-06-07 NOTE — Progress Notes (Signed)
Summary: refill  Phone Note Refill Request Message from:  Patient on October 06, 2009 2:39 PM  Refills Requested: Medication #1:  FUROSEMIDE 40 MG TABS Take one tablet by mouth daily. [facility]  Initial call taken by: Judie Grieve,  October 06, 2009 2:39 PM    Prescriptions: FUROSEMIDE 40 MG TABS (FUROSEMIDE) Take one tablet by mouth daily.  #90 x 3   Entered by:   Kem Parkinson   Authorized by:   Norva Karvonen, MD   Signed by:   Kem Parkinson on 10/07/2009   Method used:   Faxed to ...       [facility] Environmental education officer)       [address] [address]       [place], [place]  [address]       Ph: [phone]       Fax: [phone]   RxID:   [id number]

## 2010-06-07 NOTE — Letter (Signed)
Summary: [facility]   [facility]   Imported By: Roderic Ovens 09/09/2009 11:46:14  _____________________________________________________________________  External Attachment:    Type:   Image     Comment:   External Document

## 2010-06-09 NOTE — Assessment & Plan Note (Signed)
Summary: Cardiology Nuclear Testing  Nuclear Med Background Indications for Stress Test: Evaluation for Ischemia   History: Echo, Myocardial Perfusion Study, Stents  History Comments: 1/11 Echo - nml/ EF=60-65% '08  MPI -nml EF=60% Hx. Stent-Iliac  Symptoms: Dizziness    Nuclear Pre-Procedure Cardiac Risk Factors: Carotid Disease, Claudication, History of Smoking, Hypertension, IDDM Type 2, Lipids, PVD, RBBB Caffeine/Decaff Intake: None NPO After: 8:00 PM Lungs: Clear IV 0.9% NS with Angio Cath: 22g     IV Site: R Antecubital IV Started by: Irean Hong, RN Chest Size (in) 44     Height (in): 71 Weight (lb): 180 BMI: 25.20  Nuclear Med Study 1 or 2 day study:  1 day     Stress Test Type:  Treadmill/Lexiscan Reading MD:  Charlton Haws, MD     Referring MD:  D. Bensimhon Resting Radionuclide:  Technetium 55m Tetrofosmin     Resting Radionuclide Dose:  11 mCi  Stress Radionuclide:  Technetium 36m Tetrofosmin     Stress Radionuclide Dose:  33 mCi   Stress Protocol Exercise Time (min):  3:54 min     Max HR:  111 bpm     Predicted Max HR:  142 bpm  Max Systolic BP: 185 mm Hg     Percent Max HR:  78.17 %     METS: 4.6 Rate Pressure Product:  04540  Lexiscan: 0.4 mg   Stress Test Technologist:  Irean Hong,  RN     Nuclear Technologist:  Doyne Keel, CNMT  Rest Procedure  Myocardial perfusion imaging was performed at rest 45 minutes following the intravenous administration of Technetium 39m Tetrofosmin.  Stress Procedure  The patient attempted to walk the treadmill utilizing the  Bruce protocol for 2 minutes and 45 seconds, but unable to reach his target heart rate due to bilateral calf tightness, 7/10.The patient received IV Lexiscan 0.4 mg over 15-seconds with concurrent low level exercise and then Technetium 41m Tetrofosmin was injected at 30-seconds, and the treadmill was stopped due to the remaining calf tightness.  There were nonspecific changes with Lexiscan, frequent  PVC's,and frequent runs trigeminy PVC's.The patient complained of chest tightness after lexiscan.  Quantitative spect images were obtained after a 45 minute delay.  QPS Raw Data Images:  Normal; no motion artifact; normal heart/lung ratio. Stress Images:  Normal homogeneous uptake in all areas of the myocardium. Rest Images:  Normal homogeneous uptake in all areas of the myocardium. Subtraction (SDS):  Normal Transient Ischemic Dilatation:  1.01  (Normal <1.22)  Lung/Heart Ratio:  .29  (Normal <0.45)  Quantitative Gated Spect Images QGS cine images:  non-gated study  Findings Normal nuclear study      Overall Impression  Exercise Capacity: Poor unable to reach target HR so Lexiscan given BP Response: Normal blood pressure response. Clinical Symptoms: Chest Tightness with Lexiscan ECG Impression: Couplest, PVC's and Tigeminny no ischemia Overall Impression: No ischemia or infarct.  Consider further w/u of ectopy  Appended Document: Cardiology Nuclear Testing ok  Appended Document: Cardiology Nuclear Testing Left message to call back   Appended Document: Cardiology Nuclear Testing pt aware

## 2010-06-09 NOTE — Assessment & Plan Note (Signed)
Summary: PER CHECK OUT/F   Visit Type:  Follow-up Primary [name]:  Dr Evlyn Kanner  CC:  no complaints.  History of Present Illness: [name] is 77 year old male with history of severe hypertension, hyperlipidemia, diabetes, right bundle-branch block with chronic bradycardia as well as peripheral vascular disease, status post right carotid endarterectomy and bilateral common iliac stenting  CT angiogram has been negative for renal artery stenosis.  His therapy has been limited by his bradycardia as well as hyperkalemia and inability to tolerate several antihypertensives most recently cardura which caused symptomatic hypotension.  At last visit we increased hydralazine to 50 three times a day and ordered Myoview. Unable to get HR to target so switched to Lexiscan. Normal perfusiong Non-gated due to frequent PVCs.  Also prescribed compression hose but couldn't get on.   Feels pretty good. SBP at home now up in the 150-160 range. Compliant with all meds  No orthopnea, PND. No CP. HR remains in 50s on monitor at thome but no syncope or presyncope. Following with Dr. Excell Seltzer for extensive PAD/ Still playing golf 2-3x/week. Edema much improved on metolazone which he takes about 3x/week.   Current Medications (verified): 1)  Glipizide 10 Mg Xr24h-Tab (Glipizide) .... Take 1 Tablet By Mouth Once A Day 2)  Aspirin Ec 325 Mg Tbec (Aspirin) .... Take One Tablet By Mouth Once Daily 3)  Lantus 100 Unit/ml Soln (Insulin Glargine) .... As Directed 4)  Vytorin 10-40 Mg Tabs (Ezetimibe-Simvastatin) .... Take One Tablet By Mouth Dailyat Bedtime 5)  Clonidine Hcl 0.2 Mg Tabs (Clonidine Hcl) .[name].. 1 Tab Two Times A Day 6)  Furosemide 40 Mg Tabs (Furosemide) .... Take One Tablet By Mouth Daily. 7)  Niaspan 1000 Mg Cr-Tabs (Niacin (Antihyperlipidemic)) .... Take 1 Tablet By Mouth Two Times A Day 8)  Amlodipine Besylate 10 Mg Tabs (Amlodipine Besylate) .... Take One Tablet By Mouth Daily 9)  Plavix 75 Mg Tabs  (Clopidogrel Bisulfate) .... Take One Tablet By Mouth Daily 10)  Hydralazine Hcl 50 Mg Tabs (Hydralazine Hcl) .... Take One Tablet By Mouth Three Times A Day 11)  Metformin Hcl 1000 Mg Tabs (Metformin Hcl) .... Take 1 Tablet By Mouth Two Times A Day 12)  Lyrica 75 Mg Caps (Pregabalin) .[name].. 1 By Mouth Two Times A Day 13)  Metolazone 2.5 Mg Tabs (Metolazone) .... Take By Mouth As Needed  Allergies (verified): 1)  ! Cardura 2)  ! Danie Chandler  Past History:  Past Medical History: Last updated: 06/17/2009 CAROTID STENOSIS (ICD-433.10)    --s/p R carotid endarterectomy   --u/s 4/10  L 40-59% R 0-39%  (stable) HYPERTENSION (ICD-401.9)-Refractory   --CT angio negative for renal artery stenosis   --unable to tolerate minoxidil or doxazosin due to low BP. can't tolerate spironlactone due to hyperkalemia Edema    --echo 1/11: EF 60% grade 1 diastolic dysfunction PAD    --s/p bilateral iliac stenting CEREBROVASCULAR DISEASE (ICD-437.9) HYPERLIPIDEMIA (ICD-272.4) BRADYCARDIA (ICD-427.89) DIABETES MELLITUS, TYPE II (ICD-250.00) Myoview normal 2008 h/o Hyperkalemia  Review of Systems       As per HPI and past medical history; otherwise all systems negative.   Vital Signs:  Patient profile:   77 year old male Height:      71 inches Weight:      185 pounds BMI:     25.90 Pulse rate:   65 / minute BP sitting:   164 / 44  (left arm) Cuff size:   regular  Vitals Entered By: Hardin Negus, RMA (April 28, 2010 11:22 AM)  Physical Exam  General:  Pt is alert and oriented, in no acute distress. HEENT: normal Neck: normal carotid upstrokes without bruits, JVP normal Lungs: CTA CV: RRR without murmur or gallop Abd: soft, NT, positive BS, no bruit, no organomegaly Ext: no clubbing, cyanosis, 1+ edema. femoral pulses 2+=, pedals not palpable. Skin: warm and dry without rash    Impression & Recommendations:  Problem # 1:  HYPERTENSION (ICD-401.9) BP still elevated and medication  otpions limited due to intolerances and hyperkalemia. Will increase hydralazine to 75 three times a day. Consider referral for renal artery denervation trial.   Problem # 2:  EDEMA (ICD-782.3) Improved with metolzaone but still with some fluid on board due to venous insuffieincy.   Problem # 3:  BRADYCARDIA (ICD-427.89) Stable. Asymptomatic. May need a pacer at some point.   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 Followed in [facility] clinic.   Patient Instructions: 1)  Increase Hydralazine to 1 & 1/2 tabs three times a day  2)  Follow up in 2-3 months. Prescriptions: HYDRALAZINE HCL 50 MG TABS (HYDRALAZINE HCL) Take 1 & 1/2 tablet by mouth three times a day  #405 x 3   Entered by:   [name], RN   Authorized by:   Dolores Patty, MD, [facility]   Signed by:   [name], RN on 04/28/2010   Method used:   Faxed to ...       [facility] Environmental education officer)       [address] [address]       [place], [place]  [address]       Ph: [phone]       Fax: [phone]   RxID:   [id number]

## 2010-06-29 ENCOUNTER — Encounter (INDEPENDENT_AMBULATORY_CARE_PROVIDER_SITE_OTHER): Payer: Self-pay | Admitting: *Deleted

## 2010-07-05 NOTE — Letter (Signed)
Summary: Appointment - Reschedule  [facility], Main Office  [address]   [place], [place] [address]   Phone: [phone]  Fax: [phone]     June 29, 2010 MRN: [id number]   [name] [address] [place], [place]  [address]   Dear [name],   Due to a change in our office schedule, your appointment on  march 2,2012 at 11:15 must be changed.  It is very important that we reach you to reschedule this appointment. We look forward to participating in your health care needs. Please contact us at the number listed above at your earliest convenience to reschedule this appointment.     Sincerely,  Control and instrumentation engineer

## 2010-07-08 ENCOUNTER — Ambulatory Visit: Payer: Self-pay | Admitting: Internal Medicine

## 2010-07-15 ENCOUNTER — Encounter: Payer: Self-pay | Admitting: Internal Medicine

## 2010-07-24 LAB — CBC
HCT: 36 % — ABNORMAL LOW (ref 39.0–52.0)
MCH: 31.6 pg (ref 26.0–34.0)
MCV: 91.7 fL (ref 78.0–100.0)
RBC: 3.93 MIL/uL — ABNORMAL LOW (ref 4.22–5.81)
RDW: 13.9 % (ref 11.5–15.5)
WBC: 5.4 10*3/uL (ref 4.0–10.5)

## 2010-07-24 LAB — DIFFERENTIAL
Eosinophils Absolute: 0.1 10*3/uL (ref 0.0–0.7)
Eosinophils Relative: 1 % (ref 0–5)
Lymphocytes Relative: 22 % (ref 12–46)
Lymphs Abs: 1.2 10*3/uL (ref 0.7–4.0)
Monocytes Absolute: 0.4 10*3/uL (ref 0.1–1.0)

## 2010-07-24 LAB — BASIC METABOLIC PANEL
BUN: 18 mg/dL (ref 6–23)
CO2: 26 mEq/L (ref 19–32)
Chloride: 102 mEq/L (ref 96–112)
Creatinine, Ser: 1.26 mg/dL (ref 0.4–1.5)
Potassium: 3.5 mEq/L (ref 3.5–5.1)

## 2010-07-28 ENCOUNTER — Encounter: Payer: Self-pay | Admitting: Internal Medicine

## 2010-07-28 ENCOUNTER — Ambulatory Visit (INDEPENDENT_AMBULATORY_CARE_PROVIDER_SITE_OTHER): Payer: Medicare Other | Admitting: Internal Medicine

## 2010-07-28 VITALS — BP 144/58 | HR 67 | Wt 192.4 lb

## 2010-07-28 DIAGNOSIS — R609 Edema, unspecified: Secondary | ICD-10-CM

## 2010-07-28 DIAGNOSIS — I10 Essential (primary) hypertension: Secondary | ICD-10-CM

## 2010-07-28 DIAGNOSIS — I6529 Occlusion and stenosis of unspecified carotid artery: Secondary | ICD-10-CM

## 2010-07-28 DIAGNOSIS — I70229 Atherosclerosis of native arteries of extremities with rest pain, unspecified extremity: Secondary | ICD-10-CM

## 2010-07-28 MED ORDER — TORSEMIDE 20 MG PO TABS: 40.0000 mg | ORAL_TABLET | Freq: Every day | ORAL | Status: DC

## 2010-07-28 NOTE — Progress Notes (Signed)
HPI: [name] is 77 year old male with history of severe hypertension, hyperlipidemia, diabetes c/b peripheral neuropathy, right bundle-branch block with chronic bradycardia as well as peripheral vascular disease, status post right carotid endarterectomy and bilateral common iliac stenting and chronic LE edema due to venous insufficiency.   CT angiogram has been negative for renal artery stenosis.  His therapy has been limited by his bradycardia as well as hyperkalemia and inability to tolerate several antihypertensives most recently cardura which caused symptomatic hypotension.  At last visit we increased hydralazine to 50 three times a day and ordered Myoview. Unable to get HR to target so switched to Lexiscan. Normal perfusion: Non-gated due to frequent PVCs.  Also prescribed compression hose but couldn't get on.   Feels pretty good. SBP at home running 140-150 range. Compliant with all meds  No orthopnea, PND. No CP. HR remains in 50s on monitor at thome but no syncope or presyncope. Following with Dr. Excell Seltzer for extensive PAD. Still playing golf occasionally.  About a month  Lyrica increased. Since that time edema has been worse. Legs feel tight. Taking metolazone every other day without significant effect.    ROS: All systems negative except as listed in HPI, PMH and Problem List.  Past Medical History  Diagnosis Date  . Carotid stenosis   . HTN (hypertension)   . Edema   . PAD (peripheral artery disease)   . Cerebrovascular disease, unspecified   . HLD (hyperlipidemia)   . Bradycardia   . Diabetes mellitus type II   . Hyperkalemia     Current Outpatient Prescriptions  Medication Sig Dispense Refill  . amLODipine (NORVASC) 10 MG tablet Take 10 mg by mouth daily.        [name] aspirin 325 MG tablet Take 325 mg by mouth daily.        . cloNIDine (CATAPRES) 0.2 MG tablet Take 0.2 mg by mouth 2 (two) times daily.        . clopidogrel (PLAVIX) 75 MG tablet Take 75 mg by mouth daily.          [name] ezetimibe-simvastatin (VYTORIN) 10-40 MG per tablet Take 1 tablet by mouth at bedtime.        . furosemide (LASIX) 40 MG tablet Take 40 mg by mouth daily.        [name] glipiZIDE (GLUCOTROL) 10 MG tablet Take 10 mg by mouth daily.        . hydrALAZINE (APRESOLINE) 50 MG tablet Take 75 mg by mouth 3 (three) times daily.        . insulin glargine (LANTUS) 100 UNIT/ML injection as directed.        . metFORMIN (GLUCOPHAGE) 1000 MG tablet Take 1,000 mg by mouth 2 (two) times daily with a meal.        . metolazone (ZAROXOLYN) 2.5 MG tablet Take 2.5 mg by mouth daily as needed.        . niacin (NIASPAN) 1000 MG CR tablet Take 1,000 mg by mouth 2 (two) times daily.        . pregabalin (LYRICA) 75 MG capsule Take 75 mg by mouth 2 (two) times daily.           PHYSICAL EXAM: Filed Vitals:   07/28/10 1528  BP: 144/58  Pulse: 67   General:  Well appearing. No resp difficulty HEENT: normal Neck: L scar. normal carotid upstrokes without bruits, JVP hard to see appears to be 8-9 Lungs: CTA CV: RRR without murmur. +s4 gallop Abd: soft, NT. Moderately  distended no bruit, no organomegaly Ext: no clubbing, cyanosis, 3+ edema.pedals not palpable. Skin: warm and dry without rash  ECG: NSR 67 RBBB 1avb ( ) No ST-T wave abnormalities.     ASSESSMENT & PLAN:

## 2010-07-28 NOTE — Assessment & Plan Note (Signed)
Improved but not ideal control. Will see how he responds to increased diuretic regimen.

## 2010-07-28 NOTE — Assessment & Plan Note (Signed)
Stable. Followed by Dr. Excell Seltzer.

## 2010-07-28 NOTE — Assessment & Plan Note (Signed)
He has significant edema today. Likely multifactorial. Will switch lasix to demadex 40 daily and stop metolazone. Will return for CMET, BNP and U/A on Monday. F/u in 2 weeks with Tereso Newcomer and me in 2 months. Previously unable to use compression stockings as he couldn't get them on. May consider lower strength stockings.

## 2010-07-28 NOTE — Assessment & Plan Note (Signed)
Stable. Asymptomatic. Due for repeat u/s in the summer.

## 2010-07-28 NOTE — Patient Instructions (Signed)
Your physician recommends that you schedule a follow-up appointment in: 2months with Dr. Gala Romney Your physician recommends that you schedule a follow-up appointment in: Tereso Newcomer PA in 2 weeks Your physician has recommended you make the following change in your medication: STOP LASIX AND METOLAZONE.   START: DEMADEX 40MG  DAILY Your physician recommends that you HAVE lab work Monday 08/01/10

## 2010-08-01 ENCOUNTER — Other Ambulatory Visit (INDEPENDENT_AMBULATORY_CARE_PROVIDER_SITE_OTHER): Payer: Medicare Other | Admitting: *Deleted

## 2010-08-01 DIAGNOSIS — I10 Essential (primary) hypertension: Secondary | ICD-10-CM

## 2010-08-01 DIAGNOSIS — R609 Edema, unspecified: Secondary | ICD-10-CM

## 2010-08-01 DIAGNOSIS — R0602 Shortness of breath: Secondary | ICD-10-CM

## 2010-08-01 LAB — URINALYSIS
Hgb urine dipstick: NEGATIVE
Leukocytes, UA: NEGATIVE
Urine Glucose: NEGATIVE
Urobilinogen, UA: 0.2 (ref 0.0–1.0)

## 2010-08-01 LAB — HEPATIC FUNCTION PANEL
ALT: 14 U/L (ref 0–53)
Albumin: 4 g/dL (ref 3.5–5.2)
Bilirubin, Direct: 0.2 mg/dL (ref 0.0–0.3)
Total Protein: 6.6 g/dL (ref 6.0–8.3)

## 2010-08-01 LAB — LIPID PANEL
Cholesterol: 97 mg/dL (ref 0–200)
HDL: 37.7 mg/dL — ABNORMAL LOW (ref >39.00)
Triglycerides: 129 mg/dL (ref 0.0–149.0)
VLDL: 25.8 mg/dL (ref 0.0–40.0)

## 2010-08-01 LAB — BRAIN NATRIURETIC PEPTIDE: Pro B Natriuretic peptide (BNP): 69.1 pg/mL (ref 0.0–100.0)

## 2010-08-11 ENCOUNTER — Ambulatory Visit (INDEPENDENT_AMBULATORY_CARE_PROVIDER_SITE_OTHER): Payer: Medicare Other | Admitting: Physician Assistant

## 2010-08-11 ENCOUNTER — Encounter: Payer: Self-pay | Admitting: Physician Assistant

## 2010-08-11 VITALS — BP 172/58 | HR 58 | Ht 71.0 in | Wt 195.0 lb

## 2010-08-11 DIAGNOSIS — I10 Essential (primary) hypertension: Secondary | ICD-10-CM

## 2010-08-11 DIAGNOSIS — I498 Other specified cardiac arrhythmias: Secondary | ICD-10-CM

## 2010-08-11 DIAGNOSIS — R001 Bradycardia, unspecified: Secondary | ICD-10-CM

## 2010-08-11 DIAGNOSIS — R609 Edema, unspecified: Secondary | ICD-10-CM

## 2010-08-11 LAB — GLUCOSE, CAPILLARY: Glucose-Capillary: 126 mg/dL — ABNORMAL HIGH (ref 70–99)

## 2010-08-11 LAB — CBC
HCT: 34.9 % — ABNORMAL LOW (ref 39.0–52.0)
Hemoglobin: 12.1 g/dL — ABNORMAL LOW (ref 13.0–17.0)
MCHC: 34.8 g/dL (ref 30.0–36.0)
MCV: 93.7 fL (ref 78.0–100.0)
MCV: 94.1 fL (ref 78.0–100.0)
Platelets: 103 10*3/uL — ABNORMAL LOW (ref 150–400)
Platelets: 105 10*3/uL — ABNORMAL LOW (ref 150–400)
RBC: 3.73 MIL/uL — ABNORMAL LOW (ref 4.22–5.81)
WBC: 5 10*3/uL (ref 4.0–10.5)
WBC: 6.4 10*3/uL (ref 4.0–10.5)

## 2010-08-11 LAB — BASIC METABOLIC PANEL
BUN: 12 mg/dL (ref 6–23)
BUN: 16 mg/dL (ref 6–23)
Calcium: 8.2 mg/dL — ABNORMAL LOW (ref 8.4–10.5)
Chloride: 103 mEq/L (ref 96–112)
Chloride: 104 mEq/L (ref 96–112)
Creatinine, Ser: 1.22 mg/dL (ref 0.4–1.5)
GFR calc Af Amer: >60 mL/min (ref >60)
GFR: 55.16 mL/min — ABNORMAL LOW (ref >60.00)
Glucose, Bld: 110 mg/dL — ABNORMAL HIGH (ref 70–99)
Potassium: 5.5 mEq/L — ABNORMAL HIGH (ref 3.5–5.1)
Potassium: 5.5 mEq/L — ABNORMAL HIGH (ref 3.5–5.1)
Sodium: 141 mEq/L (ref 135–145)
Sodium: 142 mEq/L (ref 135–145)

## 2010-08-11 NOTE — Progress Notes (Signed)
History of Present Illness: Primary Cardiologist:  Dr. Arvilla Meres  [name] Celestine is a 77 y.o. male with a history of severe hypertension, hyperlipidemia, diabetes c/b peripheral neuropathy, right bundle-branch block with chronic bradycardia as well as peripheral vascular disease, status post right carotid endarterectomy and bilateral common iliac stenting and chronic LE edema due to venous insufficiency.  CT angiogram has been negative for renal artery stenosis. His therapy has been limited by his bradycardia as well as hyperkalemia and inability to tolerate several antihypertensives most recently cardura which caused symptomatic hypotension.  Lexiscan Myoview recently demonstrated normal perfusion.  He has been unable to get compression hose on.  He saw Dr. Gala Romney 2 weeks ago with significant 3+ edema and switched him to demadex and stopped the metolazone.  He returns for follow up.    He states that his edema is much improved.  He is tolerating the Demadex.  He denies chest pain.  He denies significant shortness of breath.  He describes class II symptoms.  He denies orthopnea, PND.  He denies syncope.  Past Medical History  Diagnosis Date  . Carotid stenosis     u/s 5/11: R 0-39% L 40-59% (stable)  . HTN (hypertension)     refractory. unable to cardura in past due to hypotension  . Edema   . PAD (peripheral artery disease)   . Cerebrovascular disease, unspecified   . HLD (hyperlipidemia)   . Bradycardia   . Diabetes mellitus type II   . Hyperkalemia     Current Outpatient Prescriptions  Medication Sig Dispense Refill  . amLODipine (NORVASC) 10 MG tablet Take 10 mg by mouth daily.        [name] aspirin 325 MG tablet Take 325 mg by mouth daily.        . cloNIDine (CATAPRES) 0.2 MG tablet Take 0.2 mg by mouth 2 (two) times daily.        . clopidogrel (PLAVIX) 75 MG tablet Take 75 mg by mouth daily.        [name] ezetimibe-simvastatin (VYTORIN) 10-40 MG per tablet Take 1 tablet by mouth at  bedtime.        [name] glipiZIDE (GLUCOTROL) 10 MG tablet Take 10 mg by mouth daily.        . hydrALAZINE (APRESOLINE) 50 MG tablet Take 75 mg by mouth 3 (three) times daily.        . insulin glargine (LANTUS) 100 UNIT/ML injection as directed.        . metFORMIN (GLUCOPHAGE) 1000 MG tablet Take 1,000 mg by mouth 2 (two) times daily with a meal.        . niacin (NIASPAN) 1000 MG CR tablet Take 1,000 mg by mouth 2 (two) times daily.        . pregabalin (LYRICA) 75 MG capsule Take 75 mg by mouth 2 (two) times daily.        [name] torsemide (DEMADEX) 20 MG tablet Take 2 tablets (40 mg total) by mouth daily.  60 tablet  11    Allergies  Allergen Reactions  . Aliskiren Fumarate     REACTION: Reaction not known  . Doxazosin Mesylate     Vital Signs: BP 172/58  Pulse 58  Ht 5\' 11"  (1.803 m)  Wt 195 lb (88.451 kg)  BMI 27.20 kg/m2  PHYSICAL EXAM: Well nourished, well developed, in no acute distress HEENT: normal Neck: no JVD At 45 Cardiac:  normal S1, S2; RRR; no murmur Lungs:  clear to auscultation bilaterally,  no wheezing, rhonchi or rales Abd: soft, nontender, no hepatomegaly Ext: Trace to 1+ tight bilateral edema Skin: warm and dry Neuro:  CNs 2-12 intact, no focal abnormalities noted  EKG:  Sinus bradycardia, heart rate 58, normal axis, first degree AV block with a PR interval of 284 ms, right bundle branch block  ASSESSMENT AND PLAN:

## 2010-08-11 NOTE — Patient Instructions (Addendum)
Your physician recommends that you schedule a follow-up appointment in: 2 months with Dr. Gala Romney .  Your physician recommends that you return for lab work in: TODAY BMET 782.3  Your physician has requested that you regularly monitor and record your blood pressure readings at home. Please use the same machine at the same time of day to check your readings and record them to bring to your follow-up visit.  YOU NEED TO COME IN 2-3 WEEKS FOR A NURSE VISIT FOR A BLOOD PRESSURE CHECK, PLEASE BRING YOUR MACHINE SO THAT WE MAY COMPARE THE READINGS AS PER SCOTT WEAVER, PA-C   Your physician has recommended you make the following change in your medication: INCREASE YOUR HYDRALAZINE START TAKING 4 OF THE 25 MG TABLETS THREE TIMES A DAY AS PER SCOTT WEAVER, PA-C....  YOU HAVE ALSO BEEN GIVEN A PRESCRIPTION FOR A COMPRESSION STOCKINGS. PLEASE WEAR THESE FROM THE TIME YOU GET UP IN THE MORNING UNTIL THE TIME YOU GO TO BED.

## 2010-08-11 NOTE — Assessment & Plan Note (Signed)
BNP was normal when checked by Dr. Gala Romney.  His UA was also negative for proteinuria.  He has had a fairly good response to the Demadex.  His weight has not really changed that much.  He may be able to tolerate a light strength compression hose now.  I will prescribe this to him.  I will check a basic metabolic panel today to followup on his renal function and potassium.  I will hold off on adjusting his Demadex and further given his significant improvement.

## 2010-08-11 NOTE — Assessment & Plan Note (Signed)
His pressure is somewhat worse.  When I checked it, it was 166/58.  I see that he has had a problem with hyperkalemia in the past.  I do not think that spironolactone would be an ideal choice.  I'm a little hesitant to increase his clonidine given his first-degree AV block and right bundle branch block.  I will increase his hydralazine to 100 mg 3 times a day (maximum dose).  He will be set up for a blood pressure check with the nurse in 2-3 weeks.  I have asked him to bring his blood pressure cuff and with him at that time to have it checked.  He states his pressures at home are much better than they are here.  He will keep his regular follow up with Dr. Gala Romney

## 2010-08-12 ENCOUNTER — Telehealth: Payer: Self-pay | Admitting: *Deleted

## 2010-08-12 DIAGNOSIS — I10 Essential (primary) hypertension: Secondary | ICD-10-CM

## 2010-08-12 LAB — BASIC METABOLIC PANEL
BUN: 18 mg/dL (ref 6–23)
BUN: 17 mg/dL (ref 6–23)
BUN: 18 mg/dL (ref 6–23)
CO2: 25 mEq/L (ref 19–32)
Calcium: 8.8 mg/dL (ref 8.4–10.5)
Calcium: 9 mg/dL (ref 8.4–10.5)
Chloride: 107 mEq/L (ref 96–112)
Creatinine, Ser: 1.47 mg/dL (ref 0.4–1.5)
GFR calc non Af Amer: 54 mL/min — ABNORMAL LOW (ref >60)
Glucose, Bld: 74 mg/dL (ref 70–99)
Glucose, Bld: 142 mg/dL — ABNORMAL HIGH (ref 70–99)
Glucose, Bld: 145 mg/dL — ABNORMAL HIGH (ref 70–99)
Potassium: 6.1 mEq/L — ABNORMAL HIGH (ref 3.5–5.1)
Sodium: 138 mEq/L (ref 135–145)

## 2010-08-12 LAB — CBC
HCT: 35.7 % — ABNORMAL LOW (ref 39.0–52.0)
MCHC: 34.7 g/dL (ref 30.0–36.0)
MCV: 93.8 fL (ref 78.0–100.0)
Platelets: 116 10*3/uL — ABNORMAL LOW (ref 150–400)
RDW: 14 % (ref 11.5–15.5)

## 2010-08-12 LAB — GLUCOSE, CAPILLARY: Glucose-Capillary: 97 mg/dL (ref 70–99)

## 2010-08-12 NOTE — Telephone Encounter (Signed)
LMOM FOR PTCB

## 2010-08-17 ENCOUNTER — Other Ambulatory Visit (INDEPENDENT_AMBULATORY_CARE_PROVIDER_SITE_OTHER): Payer: Medicare Other | Admitting: *Deleted

## 2010-08-17 DIAGNOSIS — I10 Essential (primary) hypertension: Secondary | ICD-10-CM

## 2010-08-17 LAB — BASIC METABOLIC PANEL
BUN: 21 mg/dL (ref 6–23)
Chloride: 101 mEq/L (ref 96–112)
Creatinine, Ser: 1.3 mg/dL (ref 0.4–1.5)

## 2010-08-18 ENCOUNTER — Telehealth: Payer: Self-pay | Admitting: Internal Medicine

## 2010-08-18 NOTE — Telephone Encounter (Signed)
Gave pt lab results. 

## 2010-08-23 LAB — BASIC METABOLIC PANEL
BUN: 21 mg/dL (ref 6–23)
Chloride: 102 mEq/L (ref 96–112)
Creatinine, Ser: 1.19 mg/dL (ref 0.4–1.5)

## 2010-08-23 LAB — CBC
MCHC: 34.4 g/dL (ref 30.0–36.0)
MCV: 92.6 fL (ref 78.0–100.0)
Platelets: 141 10*3/uL — ABNORMAL LOW (ref 150–400)
WBC: 6.4 10*3/uL (ref 4.0–10.5)

## 2010-08-23 LAB — GLUCOSE, CAPILLARY: Glucose-Capillary: 193 mg/dL — ABNORMAL HIGH (ref 70–99)

## 2010-08-24 ENCOUNTER — Other Ambulatory Visit: Payer: Self-pay | Admitting: *Deleted

## 2010-08-24 DIAGNOSIS — R609 Edema, unspecified: Secondary | ICD-10-CM

## 2010-08-24 DIAGNOSIS — I10 Essential (primary) hypertension: Secondary | ICD-10-CM

## 2010-08-24 MED ORDER — TORSEMIDE 20 MG PO TABS: 40.0000 mg | ORAL_TABLET | Freq: Every day | ORAL | Status: DC

## 2010-09-05 ENCOUNTER — Ambulatory Visit (INDEPENDENT_AMBULATORY_CARE_PROVIDER_SITE_OTHER): Payer: Medicare Other | Admitting: Internal Medicine

## 2010-09-05 ENCOUNTER — Encounter: Payer: Self-pay | Admitting: Internal Medicine

## 2010-09-05 VITALS — BP 164/60 | HR 65 | Ht 71.5 in | Wt 194.8 lb

## 2010-09-05 DIAGNOSIS — I10 Essential (primary) hypertension: Secondary | ICD-10-CM

## 2010-09-05 DIAGNOSIS — R609 Edema, unspecified: Secondary | ICD-10-CM

## 2010-09-05 DIAGNOSIS — I70219 Atherosclerosis of native arteries of extremities with intermittent claudication, unspecified extremity: Secondary | ICD-10-CM

## 2010-09-05 MED ORDER — HYDROCHLOROTHIAZIDE 25 MG PO TABS: 25.0000 mg | ORAL_TABLET | Freq: Every day | ORAL | Status: DC

## 2010-09-05 NOTE — Assessment & Plan Note (Signed)
Somewhat improved. Will give him stocking donner to help him get compression stockings on which I suspect will really help.

## 2010-09-05 NOTE — Patient Instructions (Addendum)
Your physician recommends that you schedule a follow-up appointment in: 2 months with Dr. Gala Romney Your physician has recommended you make the following change in your medication: Start hydrochlorothiazide 25 mg by mouth daily. Get a [employer] stocking donner to help put stockings on.

## 2010-09-05 NOTE — Assessment & Plan Note (Signed)
Stable. Continue f/u in PV clinic.

## 2010-09-05 NOTE — Progress Notes (Signed)
HPI:  [name] is a 77 y.o. male with a history of severe hypertension, hyperlipidemia, diabetes c/b peripheral neuropathy, right bundle-branch block with chronic bradycardia as well as peripheral vascular disease, status post right carotid endarterectomy and bilateral common iliac stenting and chronic LE edema due to venous insufficiency. CT angiogram has been negative for renal artery stenosis. His therapy has been limited by his bradycardia as well as hyperkalemia and inability to tolerate several antihypertensives most recently cardura which caused symptomatic hypotension.  Lexiscan Myoview recently demonstrated normal perfusion. He has been unable to get compression hose on. We have been struggling to manage his edema. Recently switched to demadex.BNP recently was 69.   Edema is improved. He denies chest pain. He denies significant shortness of breath. Hydralazine recently increased 100 tid. SBP remains in the 160-170 range. No orthopnea or PND.     ROS: All systems negative except as listed in HPI, PMH and Problem List.  Past Medical History  Diagnosis Date  . Carotid stenosis     u/s 5/11: R 0-39% L 40-59% (stable)  . HTN (hypertension)     refractory. unable to cardura in past due to hypotension  . Edema   . PAD (peripheral artery disease)   . Cerebrovascular disease, unspecified   . HLD (hyperlipidemia)   . Bradycardia   . Diabetes mellitus type II   . Hyperkalemia     Current Outpatient Prescriptions  Medication Sig Dispense Refill  . amLODipine (NORVASC) 10 MG tablet Take 10 mg by mouth daily.        [name] aspirin 325 MG tablet Take 325 mg by mouth daily.        . cloNIDine (CATAPRES) 0.2 MG tablet Take 0.2 mg by mouth 2 (two) times daily.        . clopidogrel (PLAVIX) 75 MG tablet Take 75 mg by mouth daily.        [name] ezetimibe-simvastatin (VYTORIN) 10-40 MG per tablet Take 1 tablet by mouth at bedtime.        [name] glipiZIDE (GLUCOTROL) 10 MG tablet Take 10 mg by mouth daily.         . hydrALAZINE (APRESOLINE) 50 MG tablet Take 300 mg by mouth daily. TAKE 4 OF THE 25 MG TABLETS THREE TIMES A DAY...      . insulin glargine (LANTUS) 100 UNIT/ML injection as directed.        . metFORMIN (GLUCOPHAGE) 1000 MG tablet Take 1,000 mg by mouth 2 (two) times daily with a meal.        . niacin (NIASPAN) 1000 MG CR tablet Take 1,000 mg by mouth 2 (two) times daily.        . pregabalin (LYRICA) 75 MG capsule Take 75 mg by mouth 2 (two) times daily.        [name] torsemide (DEMADEX) 20 MG tablet Take 2 tablets (40 mg total) by mouth daily.  60 tablet  11     PHYSICAL EXAM: Filed Vitals:   09/05/10 0939  BP: 164/60  Pulse: 65   General:  Well appearing. No resp difficulty HEENT: normal Neck: L scar. normal carotid upstrokes without bruits, JVP hard to see appears to be 8-9 Lungs: CTA CV: RRR without murmur. +s4 gallop Abd: soft, NT. Moderately distended no bruit, no organomegaly Ext: no clubbing, cyanosis, 2+ edema.pedals not palpable. Skin: warm and dry without rash      ASSESSMENT & PLAN:

## 2010-09-05 NOTE — Assessment & Plan Note (Signed)
BP still elevated. Situation complicated by hyperkalemia and bradycardia. Will try HCTZ 25 qd. IF BP still high consider referral for renal artery denervation (Symplicity) trial with Dr. Samule Ohm in [place].

## 2010-09-05 NOTE — Progress Notes (Signed)
Addended byGenice Rouge on: 09/05/2010 10:46 AM   Modules accepted: Orders

## 2010-09-12 ENCOUNTER — Telehealth: Payer: Self-pay | Admitting: Internal Medicine

## 2010-09-12 NOTE — Telephone Encounter (Signed)
Pt wants to talk to nurse concerning a scripts that was written for him

## 2010-09-13 NOTE — Telephone Encounter (Signed)
Pt rtn call 

## 2010-09-13 NOTE — Telephone Encounter (Signed)
Spoke w/pt he states that he received a letter stating HCTZ and Torsemide were duplicate therapy and that Torsemide prescription was being discontinued, called company at [phone] and spoke w/pharmacist advised pt was suppose to be on both meds she has rewritten both meds with orders stating pt is to be on both, pt is aware situation taken care of

## 2010-09-13 NOTE — Telephone Encounter (Signed)
Pt is playing golf, wife will have him call us when he gets back, she states he got a letter from somebody telling him he's on 2 of the same meds

## 2010-09-20 NOTE — Progress Notes (Signed)
[facility]                        PERIPHERAL VASCULAR OFFICE NOTE   NAME:[name], [name]                       MRN:          [id number]  DATE:08/15/2007                            DOB:          Feb 21, 1930    [name] was seen in follow up at the [facility] peripheral vascular  office on August 15, 2007.  Mr. [name] is a 77 year old gentleman with  peripheral arterial disease.  He had a lower extremity duplex from  October of 2008 that showed an ABI of 0.8 on the right and 0.6 on the  left.  He really has minimal claudication but does have leg symptoms at  approximately one mile of walking.  He is able to play golf without much  difficulty.  He has leg pain both in the thighs and the calves.  His  duplex was suggestive of inflow disease in the aortoiliac region.  He  did have moderate disease in the right SFA but did not have severe SFA  disease on either side.  Recently, Mr. [name] biggest difficulty has  been with blood pressure control.  He has been followed closely by Dr  Evlyn Kanner and Dr. Gala Romney with adjustments in his antihypertensives.  He  informs me that he had good control for a time period, but one of his  antihypertensives was taken off the market, and has had more trouble  since then.  He underwent a carotid duplex in the office today and was  going to have a renal duplex as well, but our renal vascular sonographer  is not in today.  He is going to come back to have that study done as  soon as we can get it scheduled.   CURRENT MEDICATIONS:  1. Lotrel 10/20 mg daily.  2. Hyzaar 100/25 mg daily.  3. Vytorin 10/40 mg one-half daily.  4. Niaspan 500 mg twice daily.  5. Metformin 1 gram daily.  6. Glipizide ER 10 mg daily.  7. Aspirin 325 mg daily.  8. Lantus 63 units at bedtime.  9. Spironolactone 25 mg daily.   ALLERGIES:  No known drug allergies.   PHYSICAL EXAMINATION:  GENERAL:  The patient is alert and oriented.  He  is in no  distress.  He is a very pleasant man.  VITAL SIGNS:  Weight is 190, blood pressure is 186/60, heart rate is 52,  respiratory rate 16.  HEENT:  Normal.  NECK:  Normal carotid upstrokes with a right carotid bruit.  Jugular  venous pressure is normal.  LUNGS:  Clear to auscultation bilaterally.  HEART:  Regular rate and rhythm with an S4 gallop.  No murmurs.  ABDOMEN:  Soft, nontender, no organomegaly, no bruits.  EXTREMITIES:  No clubbing, cyanosis or edema.  Posterior tibial pulses  are 2+ on the right and 1+ on the left.  Dorsalis pedis pulses are not  palpable.   Preliminary report of his carotid duplex shows a patent right carotid  endarterectomy with mild plaque in the left bulb, 40-59% stenosis on the  left, 0-39% stenosis on the right.   ASSESSMENT:  1. Lower extremity peripheral  arterial disease with mild claudication.  2. Cerebrovascular disease status post right carotid endarterectomy      with nonobstructive carotid stenosis.  3. Refractory hypertension.   DISCUSSION:  Mr. Barris continues to have difficulty with blood pressure  control in spite of multiple antihypertensive medications.  His renal  duplex was scheduled for mid May, but I would really like to get it done  sooner to see if he has a reversible cause of secondary hypertension  because of renal artery stenosis.  We are going to try to find time for  him next week to get that study done.  Pending the results, will arrange  appropriate follow-up.  I did not make any changes to his medical  regimen today, as he is being closely managed by Dr. Evlyn Kanner and is due to  see him soon.     [name]. Excell Seltzer, MD  Electronically Signed    MDC/MedQ  DD: 08/15/2007  DT: 08/15/2007  Job #: [id number]   cc:   Jeannett Senior A. Evlyn Kanner, M.D.  Bevelyn Buckles. Bensimhon, MD

## 2010-09-20 NOTE — Assessment & Plan Note (Signed)
[facility]                            CARDIOLOGY OFFICE NOTE   NAME:Dietze, [name]                       MRN:          [id number]  DATE:02/28/2008                            DOB:          06/08/1929    PRIMARY CARE PHYSICIAN:  Tera Mater. [name], MD   [name] is a very pleasant 77 year old male with history of severe  hypertension, hyperlipidemia, diabetes, right bundle-branch block with  chronic bradycardia as well as peripheral vascular disease, status post  right carotid endarterectomy.   We have been following him closely for severe hypertension.  CT  angiogram has been negative for renal artery stenosis.  His therapy has  been limited by his bradycardia as well as hyperkalemia and inability to  tolerate several antihypertensives.   He was most recently tried on minoxidil, but he did not tolerate this.  He was switched to clonidine.  He has been his blood pressure every day  and the systolics range to the 130-150, more in 130-140 range with  diastolics over 50.  He is asymptomatic.  He denies any chest pain or  dyspnea.  He had a Myoview in September 2008, which was negative.   CURRENT MEDICATIONS:  1. Lotrel 10/20.  2. Niaspan 500 a day.  3. Metformin 1000 a day.  4. Glipizide 10 a day.  5. Aspirin 325 a day.  6. Lantus insulin 63 units at night.  7. Vytorin 10/40.  8. Lasix 20 a day.  9. Clonidine 0.1 b.i.d.   PHYSICAL EXAMINATION:  GENERAL:  He is well-appearing, in no acute  distress, and ambulates around the clinic without any respiratory  difficulty.  VITAL SIGNS:  Blood pressure is 126/50, heart rate 53, and weight 186.  HEENT:  Normal.  NECK:  Supple.  No JVD.  There is evidence of previous right carotid  endarterectomy scar.  There are bilateral bruits.  No lymphadenopathy or  thyromegaly.  CARDIAC:  He is bradycardic and regular with prominent split S2.  No  obvious murmur.  LUNGS:  Clear.  ABDOMEN:  Soft, nontender, and  nondistended.  No hepatosplenomegaly.  There are no abdominal bruits.  Good bowel sounds.  EXTREMITIES:  Warm  with no cyanosis, clubbing, or edema.  NEURO:  Alert and oriented x3.  Cranial nerves II through XII are  intact.  He moves all fours without difficulty.  Affect is pleasant.   EKG shows sinus bradycardia with first-degree AV block at 260 ms, and  heart rate 53.  There is right bundle-branch block.  No ST-T-wave  abnormalities.   ASSESSMENT AND PLAN:  1. Hypertension.  This is much improved and controlled.  At home his      blood pressure is still mildly elevated.  We will try to increase      his Clonidine up to 0.2 b.i.d., but need to be careful with      bradycardia and fatigue.  He will try this if he tolerates it that      would be greatest if not I told him he can probably back down to  0.1 b.i.d.  2. Bradycardia.  He has significant conduction disease on his surface      EKG.  However, he does have good heart rate response to exercise. I      told him at some point, he may need a pacemaker, but we have not      reach that point yet.  3. Carotid stenosis.  He is due for followup ultrasound next year in      April.  4. Hyperlipidemia, followed by Dr. Evlyn Kanner.  5. Diabetes also followed by Dr. Evlyn Kanner.  Recent hemoglobin A1c was      great at 6.7.   DISPOSITION:  We will see him back for routine followup in 6 months.     Bevelyn Buckles. Bensimhon, MD  Electronically Signed    DRB/MedQ  DD: 02/28/2008  DT: 02/28/2008  Job #: [id number]   cc:   Jeannett Senior A. Evlyn Kanner, M.D.

## 2010-09-20 NOTE — Progress Notes (Signed)
[facility]                        PERIPHERAL VASCULAR OFFICE NOTE   NAME:Vitrano, KHALIL SZCZEPANIK                       MRN:          [id number]  DATE:02/25/2007                            DOB:          1929-05-26    REASON FOR CONSULTATION:  Peripheral arterial disease with bilateral  iliac stenosis.   HISTORY OF PRESENT ILLNESS:  Mr. Brahm is a very nice 77 year old  gentleman with multiple cardiac risk factors as well as known vascular  disease with prior carotid endarterectomy, who presents for evaluation  of an abnormal abdominal aortic ultrasound study.  Mr. Stolp underwent  a screening abdominal ultrasound in the setting of known peripheral  arterial disease on September 29.  This demonstrated mild infrarenal  aortic dilatation but there was no focal aneurysm.  There were bilateral  common iliac artery stenoses demonstrated with marked increased  velocities across the iliac arteries.  The right common iliac artery  peak systolic velocity was 407 cm/sec. and the left common iliac  velocity was 350 cm/sec.   From a symptomatic standpoint Mr. Leard does complain of left hip pain  with activity.  He denies any discomfort in the buttock, thigh or calf  on either leg.  He does not have any right hip symptoms.  He describes  his hip as locking up after he walks approximately 1/2 to 3/4 of a  mile.  He plays golf regularly and has occasional symptoms with golfing  but does a good bit of walking out on the golf course and typically has  no symptoms.  He played 18 holes of golf this morning with a cart and he  did a moderate amount of walking without symptoms today.  He denies any  rest symptoms.  He has not had any ulcerations on the feet.  He has no  history of stroke or TIA.  He has no other complaints at this time.   PAST MEDICAL HISTORY:  1. Type 2 diabetes.  2. Essential hypertension.  3. Hyperlipidemia.  4. Carotid arterial disease, status post  right carotid endarterectomy      in 2002.  5. Gastroesophageal reflux disease.  6. Remote tobacco use.   CURRENT MEDICATIONS:  1. Lotrel 10/20 mg daily.  2. Hyzaar 100/25 mg daily.  3. Vytorin 10/40 mg one-half daily.  4. Niaspan 500 mg twice daily.  5. Metformin 1 g daily.  6. Glipizide ER 10 mg daily.  7. Aspirin 325 mg daily.  8. Lantus 63 units at bedtime.   ALLERGIES:  No known drug allergies.   SOCIAL HISTORY:  The patient is retired since 1994.  He is a former  smoker but quit in the early 1990s.  He does not drink alcohol.  He is  married and lives locally.   FAMILY HISTORY:  Father died at age 64 of cardiovascular disease.  He  has three siblings, who are all alive and well.  His mother is alive and  well.   REVIEW OF SYSTEMS:  A complete 12-point review of systems was performed  and was otherwise negative except as detailed above.   PHYSICAL  EXAMINATION:  The patient is alert and oriented.  He is in no  acute distress.  Weight is 187, blood pressure 164/50 in the right arm, 174/64 in the  left arm, heart rate is 84, respiratory rate is 16.  HEENT:  Normal.  NECK:  Normal carotid upstrokes with soft bilateral carotid bruits.  Jugular venous pressure is normal.  No thyromegaly or thyroid nodules.  LUNGS:  Clear to auscultation bilaterally.  CARDIOVASCULAR:  The heart is regular rate and rhythm without murmurs or  gallops.  ABDOMEN:  Soft, nontender, no organomegaly.  There are abdominal bruits  heard throughout the lower abdomen.  BACK:  There is no CVA tenderness.  EXTREMITIES:  There is no clubbing, cyanosis, or edema.  Femoral pulses  are 2+ and equal bilaterally with soft bilateral bruits.  Dorsalis pedis  and posterior tibial pulses are 1+.  NEUROLOGIC:  Cranial nerves II-XII are intact.  Strength is intact and  equal bilaterally.   Abdominal ultrasound reviewed showed the maximum aortic diameter of 2.4  x 2.4 cm.  Iliac velocities increased as noted  above, right common iliac  407 cm/sec., left common iliac 350 cm/sec.   ASSESSMENT:  Mr. Simkin is a 77 year old gentleman with peripheral  arterial disease.  He does have a noninvasive study consistent with  significant inflow disease.  His symptoms are fairly minimal at this  point and I have recommended conservative therapy for now with  continuing aggressive risk reduction.  Mr. Flanery does not have much  limitation from leg pain and I am uncertain whether his left hip pain is  related to vascular insufficiency, but it certainly could be.  I would  like to see him back in 6 months to follow up.  I would also like to  check a baseline ABI to have a good comparative study in case he  develops progressive problems down the road.  Certainly if he develops  lifestyle-limiting claudication, then he would be a good candidate for  endovascular treatment with percutaneous transluminal angioplasty and  stenting of his iliac arteries.   As above, we will see him back in 6 months or sooner if he develops  progressive symptoms.     [name]. Excell Seltzer, MD  Electronically Signed    MDC/MedQ  DD: 02/25/2007  DT: 02/26/2007  Job #: [id number]   cc:   Bevelyn Buckles. Bensimhon, MD  Tera Mater. Evlyn Kanner, M.D.

## 2010-09-20 NOTE — Assessment & Plan Note (Signed)
[facility]                            CARDIOLOGY OFFICE NOTE   NAME:Taillon, ROMON DEVEREUX                       MRN:          [id number]  DATE:01/22/2007                            DOB:          1929-06-07    REASON FOR CONSULTATION:  New right bundle branch block.   HISTORY OF PRESENT ILLNESS:  [name] is a delightful 77 year old male  with a history of hypertension, hyperlipidemia, diabetes, and carotid  artery stenosis who presents today for evaluation of new right bundle  branch block.  He denies any history of known heart disease.  He did  have a routine stress test in January 2001 with Dr. Melburn Popper, which was  reportedly negative.  He denies any history of chest pain or shortness  of breath.  He has had quite a long history of diabetes and high blood  pressure, for which he has followed up with Dr. Evlyn Kanner.  He also has a  history of a carotid artery stenosis, and is status post  revascularization by Dr. Madilyn Fireman.   He tells me that the has been doing quite well.  He is very active, he  plays golf, and even manages to shoot his age.  At his last visit with  Dr. Evlyn Kanner, it was noted that he had a right bundle branch block.  This  was new from previous.  He denies any chest pain.  No syncope.  No  change in his exercise tolerance.   REVIEW OF SYSTEMS:  Notable for allergies and hiatal hernia/esophageal  reflux disease, urinary hesitancy, arthritis.  Denies any heart failure  symptoms.  No neurologic symptoms.  The remainder of the review of  systems is negative, except for HPI, and problem list.   PROBLEM LIST:  1. Diabetes.  2. Hypertension.  3. Hyperlipidemia.  4. Carotid artery stenosis status post right carotid endarterectomy.  5. Gastroesophageal reflux disease.  6. History of tobacco use, quit in 1993.   CURRENT MEDICATIONS:  1. Lotrel 10/20.  2. Hyzaar 100/25.  3. Vytorin 10/40 one half tablet a day.  4. Niaspan 500 b.i.d.  5. Metformin  1000 a day.  6. Glipizide ER 10 a day.  7. Aspirin 325 a day.  8. Lantus 63 units at night.   ALLERGIES:  NO KNOWN DRUG ALLERGIES.   SOCIAL HISTORY:  He is retired.  He retired in 1994.  He has a history  of tobacco use, 1-1/2 pack per day times many years.  Denies any  significant alcohol use.   FAMILY HISTORY:  Mother is alive and doing well.  Father died at age 68  due to atherosclerosis.  He has 3 brothers, all whom are alive.  There  is no family history of premature coronary artery disease.   PHYSICAL EXAMINATION:  He is well appearing, in no acute distress.  Ambulates around the clinic without any respiratory difficulty.  Blood pressure initially was 132/60, on manual recheck it was 178/62.  His heart rate was 51.  His weight was 184.  HEENT:  Normal.  NECK:  Supple.  He has a right  carotid endarterectomy scar.  Carotids  are 2+ bilaterally with a bruit on the right, and a possible faint bruit  on the left.  CARDIAC:  He is bradycardic and regular.  His PMI is nondisplaced.  He  has a soft 2/6 systolic ejection murmur at the left sternal border, and  also a soft S4.  No rub.  LUNGS:  Clear.  ABDOMEN:  Obese, nontender, and non-distended.  There is no  hepatosplenomegaly.  No bruits.  No masses.  Appreciated good bowel  sounds.  EXTREMITIES:  Warm with no cyanosis, clubbing, or edema.  DP pulses are  1+ bilaterally.  No rash.  NEUROLOGIC:  Alert and oriented x3.  Cranial nerves 2 through 12 are  intact.  Moves all 4 extremities without difficulty.  Affect is very  pleasant.  EKG:  Showed sinus bradycardia, rate of 51 with a right bundle branch  block, and a 1st degree A-V block at 208 msec.  His QRS duration is 152  msec.  There is no significant ST-T wave abnormalities.   ASSESSMENT AND PLAN:  1. New right bundle branch block.  I suspect this is due to aging of      his conduction system, as he has evidence of a 1st A-V block, and      significant sinus bradycardia as  well.  However, I do think, given      his risk factors, it is imperative to rule out underlying ischemic      heart disease.  We will proceed with echocardiogram as well as a      treadmill Myoview.  We did discuss the possibility at some point he      may need a pacemaker, however, he does not meet clear indications      for this at this time.  2. Hypertension.  Blood pressure is markedly elevated today, but I      suspect he has a component of white coat hypertension.  He has been      followed very closely by Dr. Evlyn Kanner, and is on a good medical      regimen.  He will continue followup with him.  3. Hyperlipidemia.  Also, followed by Dr. Evlyn Kanner.  4. Peripheral vascular disease.  He is followed by Dr. Liliane Bade for      his carotids.  I do think it is reasonable to check abdominal      ultrasound to rule out any underlying abdominal aortic aneurysm.   DISPOSITION:  Pending the results of his testing.  Otherwise, we will  see him back in a few months for just routine followup.     Bevelyn Buckles. Bensimhon, MD  Electronically Signed    DRB/MedQ  DD: 01/22/2007  DT: 01/23/2007  Job #: [id number]   cc:   Jeannett Senior A. Evlyn Kanner, M.D.

## 2010-09-20 NOTE — Assessment & Plan Note (Signed)
[facility]                            CARDIOLOGY OFFICE NOTE   NAME:[name], [name]                       MRN:          [id number]  DATE:11/28/2007                            DOB:          1929-11-20    PRIMARY CARE PHYSICIAN:  Tera Mater. [name], MD   INTERVAL HISTORY:  [name] is a very pleasant 77 year old male with a  history of severe hypertension, hyperlipidemia, diabetes, right bundle-  branch block with chronic bradycardia as well as peripheral vascular  disease status post carotid endarterectomy.   We have been following him closely for severe hypertension.  He had a CT  angiogram, which did not show any significant renal artery stenosis.  We  did send plasma renin and aldosterone activities.  The renin was quite  high with a low-normal aldosterone level.  I discussed this with Dr.  Excell Seltzer to see if this could possibly indicate renal artery stenosis.  However, I am thinking about it.  I think it is probably more related to  his dual ACE inhibitor and ARB.   Overall, he is feeling quite well.  He got a new blood pressure cuff.  His average systolic blood pressures in the 150-60 range.  He has been  asymptomatic with this.   CURRENT MEDICATIONS:  1. Lotrel 10/20.  2. Vytorin 10/40 half a tablet a day.  3. Niaspan 500 a day.  4. Metformin 1000 a day.  5. Glipizide 10 mg a day.  6. Aspirin 325.  7. Lantus 63 units at night.  8. Lisinopril/HCTZ 20/12.5 b.i.d.   PHYSICAL EXAMINATION:  GENERAL:  Well appearing, in no acute distress,  ambulatory in the clinic without any respiratory difficulty.  VITAL SIGNS:  Blood pressure is 178/53, heart rate is 51, and weight is  186.  HEENT:  Normal.  NECK:  Supple.  No JVD.  There is evidence of a previous right carotid  endarterectomy scar.  There are bilateral bruits.  There is no  lymphadenopathy or thyromegaly.  CARDIAC:  He is bradycardic and regular with prominent split S2, no  murmur.  LUNGS:   Clear.  ABDOMEN:  Soft, nontender, and nondistended.  No hepatosplenomegaly.  He  does have bruits in the lower abdomen.  Good bowel sounds.  EXTREMITIES:  Warm with no cyanosis, clubbing, or edema.  I find no  ulceration.  NEURO:  Alert and oriented x3.  Cranial nerves II-XII are intact.  Moves  all 4 extremities without difficulty.  Affect is pleasant.   ASSESSMENT AND PLAN:  1. Hypertension.  This continues to be severe.  He was unable to      tolerate spironolactone due to hyperkalemia.  We will start him on      minoxidil 2.5 mg a day.  I did warn him about the side effect of      hair growth.  We did not talk explicitly about the possibility of      fluid retention.  2. Bradycardia.  This is stable.  He had good heart rate response to      exercise.  No current  indications for pacemaker.   DISPOSITION:  We will see him back in a month for a followup on his  blood pressure.     Bevelyn Buckles. Bensimhon, MD  Electronically Signed    DRB/MedQ  DD: 11/28/2007  DT: 11/29/2007  Job #: [id number]

## 2010-09-20 NOTE — H&P (Signed)
NAMEMARKES, SHATSWELL                ACCOUNT NO.:  [id number]   MEDICAL RECORD NO.:  [id number]          PATIENT TYPE:  INP   LOCATION:  3735                         FACILITY:  [facility]   PHYSICIAN:  Mark A. Perini, M.D.   DATE OF BIRTH:  [birth year]   DATE OF ADMISSION:  10/11/2006  DATE OF DISCHARGE:                              HISTORY & PHYSICAL   CHIEF COMPLAINT:  Shortness of breath, blurry vision.   HISTORY OF PRESENT ILLNESS:  [name] is a 77 year old male with past  medical history as listed below.  Six days ago, he developed urinary  frequency and dysuria and presented to [facility].  He  was given Cipro.  However, he developed headache and blurry vision on  this, and this was changed to Bactrim.  He continued to have blurry  vision and had trouble sleeping.  His urinary symptoms have resolved as  of the last 24 hours.  However, this morning, he woke up after not  sleeping all night, feeling very short of breath, and presented to the  emergency room.  In the emergency room, he was found to have a  significant hyponatremia with a sodium of 123 and was felt to require  admission.   PAST HISTORY:  1. Type 2 diabetes since 1992.  2. Atherosclerotic coronary disease.  3. Right carotid endarterectomy in 2001.  4. Hyperlipidemia.  5. Hypertension.  6. Atherosclerotic peripheral vascular disease.  7. Remote phlebitis over 30 years ago.   ALLERGIES:  No known allergies.  However, he is intolerant to CIPRO and  maybe intolerance to BACTRIM.   MEDICATIONS:  1. Lantus 65 units each evening.  2. Niaspan ER 500 mg each bedtime.  3. Metformin 1000 mg daily.  4. Aspirin 325 mg daily.  5. HCTZ 25 mg daily.  6. Hyzaar 100/25, 1 tablet daily.  7. Glipizide ER 10 mg daily.  8. Vytorin 10/40, 1 tablet each evening.  9. Lexxel 5/5, 2 tablets p.o. b.i.d.  10.In the emergency room, he was given Rocephin 1 g and Lovenox 80 mg      subcutaneously x1.   SOCIAL HISTORY:  He is  married.  He has four children, six  grandchildren.  Quit smoking 15 years ago.  No alcohol, no drug use.   FAMILY HISTORY:  Father died at age 2 of coronary disease.   REVIEW OF SYSTEMS:  He has not slept well in the last two or three days,  especially last night.  He has had trouble resting.  He had some labored  breathing this morning.   PHYSICAL EXAMINATION:  VITAL SIGNS:  Temperature 97.1, afebrile; blood  pressure 140/60, pulse 77, respiratory rate 22, 100% saturation on room  air.  GENERAL:  No acute distress.  LUNGS: Clear to auscultation bilaterally, with no wheezes, rales, or  rhonchi.  HEART:  Regular rate and rhythm, with no murmur, rub, or gallop.  ABDOMEN:  Soft and nontender, with no masses or hepatosplenomegaly.  NEUROLOGIC:  He is alert and oriented x4.  EXTREMITIES:  There is no edema.  He was moving extremities  x4.  PSYCHIATRIC:  Of note, he was inconsolable and crying in the emergency  room, and now his mood and affect are normal.   LABORATORY DATA:  Urine culture is pending.  Blood cultures x2 are  pending.  Urine white count was 7-10, red cells 0-2, rare bacteria.  Urinalysis was normal, except for small leukocytes.  White count is 7.8  with 86% segs, 7% lymphocytes, 7% monocytes.  Hemoglobin 14, platelet  count 196,000.  Sodium is 123, potassium 4.2, chloride 95, CO2 20, BUN  28, creatinine 1.7, glucose 68; baseline BUN and creatinine are 18 and 1  respectively.  BNP is less than 30.  D-dimer is slightly elevated at  0.63.  CK-MB 2, troponin I less than 0.05, myoglobin 257.  PA and  lateral chest x-ray shows no acute intracranial abnormality, except some  peribronchial thickening.   ASSESSMENT AND PLAN:  Hyponatremia, and mild acute renal insufficiency.  The patient has had a recent urinary tract infection versus prostatitis.  He may be somewhat dehydrated, especially since he is taking 50 mg of  hydrochlorothiazide.  I do not have a clear explanation for  his  shortness of breath.  We have done bilateral lower extremity Dopplers  today, which are negative.  He is having no shortness of breath  whatsoever currently, and his oxygen saturations are 98%-100% on room  air.  I have a very low suspicion for pulmonary embolism, and therefore  will not subject him to a V/Q scan or a CT angiogram, especially with  the risk of the contrast dye, given his diabetes and acute renal  insufficiency at this time.  We will hydrate him gently with a normal  saline at 75 cc/hour, and we will hold his hydrochlorothiazide.  He is a  full code status.  We have given one dose of Rocephin.  I will not give  further antibiotics unless he develops a fever or a clear source of no  infection.   ANTICIPATED LENGTH OF STAY:  1-2 days.           ______________________________  Redge Gainer Waynard Edwards, M.D.     MAP/MEDQ  D:  10/11/2006  T:  10/12/2006  Job:  [id number]   cc:   Jeannett Senior A. Evlyn Kanner, M.D.

## 2010-09-20 NOTE — Assessment & Plan Note (Signed)
[facility]                            CARDIOLOGY OFFICE NOTE   NAME:[name], [name]                       MRN:          [id number]  DATE:07/09/2007                            DOB:          23-Jul-1929    PRIMARY CARE PHYSICIAN:  Tera Mater. Evlyn Kanner, M.D.   INTERVAL HISTORY:  Mr. Ledwell is very pleasant 77 year old male with  history of hypertension, hyperlipidemia and diabetes and carotid  stenosis status post carotid endarterectomy.  Also he has a history of  right bundle branch block and significant peripheral arterial disease  with bilateral iliac stenoses which we have been following medically.  He has been seeing Dr. Excell Seltzer [facility].   Returns today for routine followup.  Overall, he is doing fairly well.  He denies any significant claudication.  He says he can do most of his  activities as long as he does not push himself too hard without any  problem.  There is no chest pain or shortness of breath.  He has not  been exercising regularly.  Denies any syncope or pre-syncope.  He has  been taking his blood pressure occasionally at home and he says the  lowest he has seen it is 140.   CURRENT MEDICATIONS:  1. Lotrel 10/20.  2. Hyzaar 100/25.  3. Vytorin 10/40 half tablet a day.  4. Niaspan 500 b.i.d.  5. Metformin.  6. Glipizide ER 10 mg a day.  7. Aspirin 325 a day.  8. Lantus insulin 63 units at night.   PHYSICAL EXAM:  He is well-appearing in no acute distress. Ambulates  around the clinic without any respiratory difficulty.  Blood pressure  was 178/80, heart rate 50. Weight was 190.  HEENT is normal.  Neck is supple.  There is no JVD.  Carotids are 2+ bilaterally.  He has  a right endarterectomy scar with a right carotid bruit.  There is no  lymphadenopathy or thyromegaly.  CARDIAC:  PMI nondisplaced.  He is regular with S4. No obvious murmur.  LUNGS:  Clear.  ABDOMEN:  Soft, nontender, nondistended.  There  is no  hepatosplenomegaly. No bruits, no masses.  No renal bruits appreciated.  EXTREMITIES: Are warm with no cyanosis, clubbing or edema.  His  peripheral pulses are diminished.  He does not have any ulcerations or  sores.  No rash.  NEURO:  Alert and oriented x3.  Cranial nerves II-XII intact.  Moves all  four extremities without difficulty.  Affect is pleasant.   His EKG shows sinus bradycardia rate of 50 with a first-degree AV block  and right bundle branch block.  PR interval was 214 milliseconds.   ASSESSMENT/PLAN:  1. Hypertension.  Blood pressures is significantly elevated today.      Given the number of medications he is on I am very concerned about      possible renal artery stenosis and we will go ahead and get renal      artery ultrasound to further evaluate.  I have also started him on      spironolactone 25 mg a day.  I have discussed this with Dr. Evlyn Kanner.      We will check follow potassium and a week or two.  Should this not      be sufficient to bring his blood pressure down may need to consider      on minoxidil.  2. Peripheral arterial disease.  I have asked him to get an exercise      bike at home and to ride it for 30 minutes every day. Obviously, he      will need to build up to this, but I have given him a graded      exercise program for which to follow.  3. Bradycardia in the setting of a right bundle branch block.  He does      have evidence of significant conduction system disease but no clear      indication for pacemaker at this point.  4. Hyperlipidemia is followed by Dr. Evlyn Kanner in the setting of diabetes      his goal LDL is less than 70.  Continue current therapy.   DISPOSITION:  Will see him back in about 2 months for further followup.     Bevelyn Buckles. Bensimhon, MD  Electronically Signed    DRB/MedQ  DD: 07/09/2007  DT: 07/09/2007  Job #: [id number]   cc:   Jeannett Senior A. Evlyn Kanner, M.D.

## 2010-09-20 NOTE — Assessment & Plan Note (Signed)
[facility]                            CARDIOLOGY OFFICE NOTE   NAME:[name] [name] CAPPIELLO                       MRN:          [id number]  DATE:09/12/2007                            DOB:          [birth year]    PRIMARY CARE PHYSICIAN:  Tera Mater. Evlyn [name], M.D.   INTERVAL HISTORY:  [name] is a very pleasant 77 year old male with a  history of hypertension, hyperlipidemia, diabetes and right bundle  branch block with chronic bradycardia.  He also has significant  peripheral vascular disease, status post carotid endarterectomy and has  bilateral iliac stenoses which has been followed medically in the [facility].  We have had quite a bit of difficulty managing his blood  pressure.   [name] [name] returns today for routine followup.  He says he is doing fairly  well.  He is concerned about his wife who had a mastectomy and is now  undergoing radiation for her breast cancer.  He denies any chest pain or  shortness of breath.  He is playing golf.  He has not had any  claudication.  He occasionally gets a cramp on top of his foot, but that  is it.  He has been taking his blood pressures at home every day with a  wrist cuff and they have been running in the 120-130 range.   We recently did a renal artery ultrasound which showed less than 60%  renal artery stenosis on the left.  However, the ostial right renal  artery was not well seen, but the right is nearly 2 cm smaller than the  left.   CURRENT MEDICATIONS:  1. Lotrel 10/20.  2. Hyzaar 100/25.  3. Vytorin 10/40 half tablet a day.  4. Niaspan 500 b.i.d.  5. Metformin 1000 a day.  6. Glipizide ER 10 a day.  7. Aspirin 325 a day.  8. Lantus 63 units at night.  9. Spirolactone 25 a day.   PHYSICAL EXAMINATION:  GENERAL:  He is well-appearing in no acute  distress.  He ambulates around the clinic without any respiratory  difficulty.  VITAL SIGNS:  Blood pressure is 176/46, heart rate 56, weight is 186.  HEENT:   Normal.  NECK:  Supple.  Evidence of a previous right carotid endarterectomy  scar.  Carotid pulses are 2+ bilaterally.  There are bilateral bruits.  There is no lymphadenopathy or thyromegaly.  CARDIAC:  Bradycardic and regular with a prominent split S2.  No audible  murmur.  LUNGS:  Clear.  ABDOMEN:  Soft, nontender, nondistended.  There is no  hepatosplenomegaly.  There are bruits heard throughout the lower  abdomen.  Good bowel sounds.  EXTREMITIES:  Warm with no cyanosis, clubbing or edema.  There is good  hair growth all the way down to the ankles bilaterally.  There is no  ulceration.  NEURO:  Alert and oriented x3.  Cranial nerves II-XII are intact.  Moves  all 4 extremities without difficulty.  Affect is very pleasant.   ASSESSMENT/PLAN:  1. Hypertension.  This is markedly elevated in clinic.  However at  home, his cuff seems to be getting more normal readings.  We are      going to have him bring his cuff in for calibration.  There is also      some suspicion of a possible right renal artery stenosis and we      will proceed with CT scan of his abdomen to further evaluate.  We      will check a BMET first to make sure his kidney function is okay.      If not, we may have to consider CO2 angiography.  2. Peripheral arterial disease.  He is doing well.  He has no evidence      of claudication.  Continue current therapy.  He will need follow up      carotid ultrasounds.  3. Hyperlipidemia followed by Dr. Evlyn [name].  Goal LDL less than 70.  4. Bradycardia.  He does have significant conduction disease with      underlying right bundle branch block.  However, he does not meet      criteria for a pacemaker yet.  He does have an adequate      chronotropic response to activity.  He has had a Myoview recently      which was normal.   DISPOSITION:  We will see him back in a few months for followup.  We  will await the results of his abdominal CT and the blood pressure cuff   calibration.     Bevelyn Buckles. Bensimhon, MD  Electronically Signed    DRB/MedQ  DD: 09/12/2007  DT: 09/12/2007  Job #: [id number]   cc:   [name] [name], M.D.

## 2010-09-20 NOTE — H&P (Signed)
NAMESTACI, CARVER                ACCOUNT NO.:  [id number]   MEDICAL RECORD NO.:  [id number]          PATIENT TYPE:  INP   LOCATION:  3735                         FACILITY:  [facility]   PHYSICIAN:  Mark A. Perini, M.D.   DATE OF BIRTH:  May 25, 1929   DATE OF ADMISSION:  10/11/2006  DATE OF DISCHARGE:                              HISTORY & PHYSICAL   CHIEF COMPLAINT:  Shortness of breath and blurry vision.   HISTORY OF PRESENT ILLNESS:  Demont is a 77 year old male with past  medical history as listed below.  Six days ago he presented to a  [facility] with urinary frequency and dysuria; he was given Cipro, but he  developed headache and blurry vision on this and this was changed to  Bactrim.  Again, he developed blurry vision and poor sleep on the  Bactrim.  In the last day, his burning with urination has disappeared  and he has had normal voiding function.  However, this morning he awoke  and felt somewhat short of breath and very anxious and presented to the  emergency room.   PAST HISTORY:  1. Type 2 diabetes since 1992.  2. Right carotid endarterectomy in January 2001.  3. Hyperlipidemia.  4. Hypertension.  5. Atherosclerotic peripheral vascular disease.  6. Phlebitis remotely.  There is no definite evidence of      atherosclerotic coronary disease.   ALLERGIES:  He is intolerant to CIPRO it seems and he may be intolerant  of BACTRIM as well.   MEDICATIONS:  1. Lantus 65 units each evening.  2. Niaspan ER 500 mg each evening.  3. Metformin 1000 mg daily.  4. Aspirin 325 mg daily.  5. Hydrochlorothiazide 25 mg daily.  6. Hyzaar 100/25 one pill daily.  7. Glipizide ER 10 mg daily.  8. Vytorin one tablet each evening.  9. Lexxel 5/5 two pills p.o. b.i.d.   SOCIAL HISTORY:  He is married.  He has 4 children and 6 grandchildren.  He quit smoking 15 years ago, no tobacco, no alcohol.   FAMILY HISTORY:  Father died at age 1 of coronary disease.   REVIEW OF SYSTEMS:  Notable  for not sleeping well last night and some  trouble breathing at 6:00 this morning.  In the emergency room, he was  inconsolable and crying profusely and required an Ativan.  There has  been no blood above or below.  No definite fevers.   PHYSICAL EXAM:  VITAL SIGNS:  Weight is 80 kg, temperature 97.1, blood  pressure 140/60, pulse 77, respiratory 22 and 100% saturation on room  air.  GENERAL:  He is in no acute distress.  He is alert and oriented x4.  LUNGS:  Clear to auscultation bilaterally with no wheezes, rales or  rhonchi.  HEART:  Regular rate and rhythm with no murmur, rub or gallop.  ABDOMEN:  Soft, nontender and nondistended with no mass or hepatomegaly.  There are normoactive bowel sounds present.  EXTREMITIES:  There is no  edema.  He moves extremities x4.   LABORATORY DATA:  Urine culture is pending.  Blood cultures  are pending.  Urinalysis is negative except small leukocytes.  Urine microscopic shows  7-10 white cells, 0-2 red cells, rare bacteria.  White count is 7.8,  hemoglobin 14.0, platelet count 196,000.  There were 86% segs, 7%  lymphocytes, 7% monocytes.  BNP was less than 30, CK-MB 2.0, troponin I  less than 0.05.  D-dimer is 0.63 and slightly elevated.  Myoglobin is  257.  Sodium is 123, potassium 4.2, chloride 95, CO2 20, BUN 28,  creatinine 1.7, glucose 68; the baseline BUN and creatinine are 18 and  1.0.   PA and lateral chest x-ray shows no acute intracranial abnormality, but  he does have some peribronchial thickening consistent with possible  chronic bronchitis.   ASSESSMENT AND PLAN:  Hyponatremia and acute renal insufficiency,  possibly due to dehydration, given the fact that he has been on 50 mg of  hydrochlorothiazide and has had a poor appetite, given the side-effects  of his antibiotics.  He may have had a recent urinary tract infection  versus prostatitis.  I cannot explain the shortness of breath as well.  He may have had an anxiety attack.  He  had lower extremity Dopplers  today which are normal.  He has no other symptoms of pulmonary embolism;  specifically, he is having no chest pain, no shortness of breath  currently and his oxygen saturations are excellent.  Therefore, I will  not pursue other evaluation for pulmonary embolism in the absence of new  symptoms.  I suspect his symptoms are more related to electrolyte  disturbances and possible anxiety.  We will hydrate him gently with  normal saline to try to correct his sodium.  We will check simultaneous  serum and urine osmolality.  He is a full code status.  We anticipate  his length of stay to be 1-2 days.           ______________________________  Redge Gainer Waynard Edwards, M.D.     MAP/MEDQ  D:  10/11/2006  T:  10/12/2006  Job:  [id number]   cc:   Jeannett Senior A. Evlyn Kanner, M.D.

## 2010-09-20 NOTE — Discharge Summary (Signed)
[name], [name]                ACCOUNT NO.:  [id number]   MEDICAL RECORD NO.:  [id number]          PATIENT TYPE:  INP   LOCATION:  3735                         FACILITY:  [facility]   PHYSICIAN:  Mark A. Perini, M.D.   DATE OF BIRTH:  Jan 25, 1930   DATE OF ADMISSION:  10/11/2006  DATE OF DISCHARGE:  10/12/2006                               DISCHARGE SUMMARY   PRIMARY CARE PHYSICIAN:  Jeannett Senior A. Evlyn Kanner, M.D.   DISCHARGE DIAGNOSES:  1. Hyponatremia.  2. Mild volume depletion  3. Acute renal insufficiency, improving at the time of discharge.  4. Dyspnea possibly due to electrolyte disturbance and anxiety.  5. Recent urinary tract infection versus prostatitis.  6. Type 2 diabetes.  7. Hypertension.  8. Atherosclerotic peripheral vascular disease.  9. Hyperlipidemia.   PROCEDURES:  Bilateral lower extremity Doppler ultrasounds which were  negative for DVT.   DISCHARGE MEDICATIONS:  [name] is to resume his:  1. Lantus 65 units subcutaneous each evening.  2. Niaspan ER 500 mg each evening.  3. Metformin 1,000 mg daily with food.  4. Aspirin 325 mg daily.  5. [name] is to discontinue hydrochlorothiazide 25 mg.  6. [name] is resume Hyzaar 100/25, one pill daily.  7. [name] is resume Glipizide ER 10 mg daily with food.  8. Vytorin 10/80, one half pill daily in the evening.  9. Lexxel 5/5, two pills twice a day.   HISTORY OF PRESENT ILLNESS:  [name] is a pleasant 77 year old gentleman  who presented with 2 days of blurry vision, insomnia, dyspnea, anxiety,  and a crying spell.  [name] had been treated with apparently Cipro for a  possible UTI one week ago.  [name] did not tolerate this due to headache and  blurry vision, and [name] was switched to Bactrim.  [name] felt as though [name] was  not tolerating the Bactrim as well, and [name] developed blurry vision and  insomnia.  His dysuria and urinary frequency actually improved, however,  on the morning of admission [name] had an episode of shortness of breath and  presented  to the emergency room and was extremely anxious and tearful in  the emergency room.  Initial workup was revealing for a mildly elevated  D-dimer and significant hyponatremia with a sodium of 123.   HOSPITAL COURSE:  [name] was admitted to a telemetry bed.  [name] had  complete resolution of his symptoms with no shortness of breath or chest  pain or anxiety further.  [name] was hydrated with normal saline with prompt  improvement in his renal function and hyponatremia.  Lower extremity  Dopplers were performed which were negative for DVT, and given the lack  of other signs of symptoms of pulmonary embolism, a further workup was  not pursued at this time for this.  [name] was deemed stable for discharge  home on October 12, 2006.  [name] did receive one dose of IV Rocephin in the  emergency room as initial x-ray was read as possibly having an  infiltrate, however, a follow-up PA and lateral chest x-ray was read as  no active disease.   DISCHARGE  PHYSICAL EXAMINATION:  VITAL SIGNS:  Temperature 97.5,  afebrile, pulse 70, respiratory rate 18, blood pressure 174/64 and  112/64 on two prior checks prior to discharge, blood sugars ranged from  96-105.  [name] had 1200 mL of fluid in and 1200 out.  Saturation on room  air was 97%.  .  Weight was 77.8 kg.  GENERAL:  [name] was in no acute distress and in good spirits.  Affect was  normal.  [name] was alert and oriented x4.  There was no JVD.  LUNGS:  Clear  to auscultation bilaterally with no wheezes, rales or rhonchi.  HEART:  Regular rate and rhythm with no murmur, rub, or gallop.  ABDOMEN:  Soft, nontender, nondistended with no mass or  hepatosplenomegaly.  There was no peripheral edema.  NEUROLOGIC:  [name] was ambulating well without difficulty.   DISCHARGE LABORATORY DATA:  White count 7.4 with 75% segs, 15%  lymphocytes, 9% monocytes, hemoglobin 12.6, platelet count 198,000.  Sodium had improved to 134, potassium 4.4, chloride 101, CO2 26, BUN 20,  creatinine had decreased  to 1.1, glucose 98.  GFR greater than 60.  Total bili 0.3, direct bili less than 0.01, total protein 5.8, albumin  2.8, calcium 8.7, alk phos 51, AST 23, ALT 29.  Hemoglobin A1c was 6.4.  Serum osmolarity osmolality was somewhat low at 270.  Urine osmolality  was pending at the time of discharge.  Blood cultures x2 were negative  at the time of discharge.   DISCHARGE INSTRUCTIONS:  1. [name] is to follow a low-salt diabetic diet.  2. [name] is to resume normal activities.  3. [name] is to call if [name] has any recurrent problems.  4. [name] is to follow up in 2 weeks with Dr. Evlyn Kanner, and [name] will call for      a visit.  5. [name] will return to the lab in 1 week for a recheck of his B-MET.  6. [name] will have the [facility] record sent to our office to see if [name]      may need urologic followup given his recent urinary tract infection      versus prostatitis.  Furthermore, [name] will monitor his blood      pressure two to three times a week at home and keep a record of      this.           ______________________________  Redge Gainer. Waynard Edwards, M.D.     MAP/MEDQ  D:  10/12/2006  T:  10/12/2006  Job:  [id number]   cc:   Jeannett Senior A. Evlyn Kanner, M.D.

## 2010-09-23 NOTE — Op Note (Signed)
[place]  Patient:    [name]                        MRN: [id number] Proc. Date: 05/20/99 Adm. Date:  [id number] Disc. Date: [id number] Attending:  Melvenia Needles CC:         Tera Mater. Evlyn Kanner, M.D.             Alvia Grove., M.D.                           Operative Report  PREOPERATIVE DIAGNOSIS:  High grade right carotid stenosis.  POSTOPERATIVE DIAGNOSIS:  High grade right carotid stenosis.  PROCEDURE:  Right carotid endarterectomy and Dacron graft angioplasty.  SURGEON:  Denman George, M.D.  ASSISTANT:  Maxwell Marion, RNFA.  ANESTHESIA:  General endotracheal.  ANESTHESIOLOGIST:  Bedelia Person, M.D.  CLINICAL NOTE:  This is a 77 year old male who was found to have carotid bruits. Evaluation including Doppler revealed evidence of severe right internal carotid  artery stenosis.  The patient is admitted at this time for elective right carotid endarterectomy.  Risks and benefits of the operative procedure was explained to the patient and the family.  The patient consented for surgery with an overall major morbidity and mortality of approximately 2% to include, but not limited to MI, VA, and death.  DESCRIPTION OF PROCEDURE:  The patient was brought to the operating room in stable condition.  Placed in the supine position.  General endotracheal anesthesia induced.  Foley catheter and arterial line inserted.  Right neck prepped and draped in a sterile fashion.  A curvilinear skin incision made along the anterior border of the right sternomastoid muscle.  The incision extended deeply through the subcutaneous tissue.  Dissection was carried down through the platysma with electrocautery.  Dissection then carried along the anterior border of the sternomastoid to the carotid sheath.  The common carotid artery mobilized proximally and encircled with a vessel loop.  The vagus nerve reflected posteriorly and preserved.   The facial vein ligated with 3-0 silk and divided.  The carotid  bifurcation exposed.  The internal carotid artery followed distally up to the posterior belly of the digastric muscle where it was encircled with a vessel loop. The origin of the superior thyroid and external carotid were encircled with vessel loops.  The carotid bifurcation itself revealed evidence of calcified plaques extending up into the internal carotid artery.  The patient was administered 7000 units of heparin intravenously.  Adequate circulation time permitted.  The carotid vessels controlled with clamps.  A longitudinal arteriotomy made in the common carotid artery.  The arteriotomy extended across the carotid bulb and up into the internal carotid artery.  There was a high grade 90% right internal carotid artery stenosis present.  A shunt was inserted.  An endarterectomy elevator was then used to remove the plaque.  The plaque was raised down to the common carotid artery where it was divided transversely with  Potts scissors.  The plaque everted out of the external and superior thyroid arteries.  Under direct vision, the plaque feathered out of the internal carotid artery well.  Fragments of plaque removed with plaque forceps.  The site irrigated with heparin and saline solution.  A preclotted knitted Dacron patch was then placed over the endarterectomy site using a running 6-0 Prolene suture.  The shunt removed.  All vessels well-flushed. Clamps  were removed, directing initial antegrade flow up the external carotid, nd following this, the internal carotid was released.  There was excellent pulse and Doppler signal in the distal internal carotid artery.  The sternomastoid fascia was then closed with running 2-0 Vicryl suture.  The platysma was closed with running 3-0 Vicryl suture.  Staples were applied to the skin.  Anesthesia reversed in the operating room.  The patient awakened readily  and moved all extremities to command.  Transferred to the recovery room in stable condition. DD:  07/14/99 TD:  07/16/99 Job: [id number]

## 2010-09-23 NOTE — H&P (Signed)
[place]  Patient:    [name], [name]                         MRN: Adm. Date:  05/20/99 Attending:  Denman George, M.D. Dictator:   Marlowe Kays, P.A. CC:         Tera Mater. Evlyn Kanner, M.D.             Alvia Grove., M.D.                         History and Physical  CVTS MEDICAL RECORD NUMBER:  [id number].  DATE OF BIRTH:  1930-02-12.  CHIEF COMPLAINT:  Right ECCVOD.  REFERRING PHYSICIAN:  Tera Mater. Evlyn Kanner, M.D.  HISTORY OF PRESENT ILLNESS:  Mr. Jaggers is a pleasant 77 year old male, referred by Dr. Evlyn Kanner after carotid bruits were found on physical exam.  The patient underwent Dopplers which revealed calcified plaques bilaterally at the carotid bifurcation with severe right ICA stenosis, with a peak velocity systolic of 407 and end-diastolic of 113.  There was mild left ICA stenosis.  The patient was seen y Dr. Denman George who recommended right CEA to reduce the risk of stroke. The patient returns previous to surgery without significant complaints.  He denies ny symptoms of TIA, CVA or amaurosis fugax.  No nausea, vomiting or vertigo.  No headache or other vision changes.  No dysphagia.  The patient states that he is  "coming out of the cold."  He denies any shortness of breath or dyspnea on exertion at the present.  He also denies any angina or arrhythmias.  His physical exam reveals bilateral carotid bruits, more pronounced on the right than on the left. There are no other significant findings.  PAST MEDICAL HISTORY: 1. IDDM. 2. Hypertension. 3. Hypercholesterolemia. 4. Remote history of PUD. 5. Status post skull fracture in 1947. 6. History of phlebitis in 1976, which was diagnosed in [place], [place] at a [facility]  PAST SURGICAL HISTORY:  Status post appendectomy as a child.  MEDICATIONS: 1. Glucotrol XL 10 mg one p.o. q.d. 2. Lexxel 5/5 tabs one p.o. q.d. 3. Lipitor 80 mg one p.o. q.d. 4. HCTZ  25 mg half tab p.o. q.d. 5. Bayer aspirin 325 mg one p.o. q.d. 6. Novolin NPH insulin 68 units daily at bedtime. 7. Glucophage 1000 mg one-half of a tablet p.o. q.d. 8. Cozaar 50 mg one p.o. q.d.  ALLERGIES:  No known drug allergies.  REVIEW OF SYSTEMS:  See HPI and past medical history for significant positives. The patient had experienced increasing shortness of breath on exertion about one month but the symptoms have now resolved.  All other systems reviewed are negative.  FAMILY HISTORY:  Noncontributory with the exception of two brothers who are alive and they have diabetes.  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  He used to smoke two packs per day for 40 years and he quit in 1992.  He denies any alcohol intake.  PHYSICAL EXAMINATION:  GENERAL:  Well-developed, well-nourished 77 year old white male in no acute distress, alert and oriented x 3.  VITAL SIGNS:  Blood pressure 160/60 on the left, 160/52 on the right; pulse 74;  respirations 16.  HEENT:  Normocephalic, atraumatic.  PERRLA.  EOMI.  No AV  nicking, papilledema,  hemorrhages or arcus senilis.  There is bilateral macular degeneration.  NECK:  Supple.  No JVD.  There are bilateral carotid bruits audible, more pronounced on the right than on the left.  No thyromegaly or lymphadenopathy.  CHEST:  Symmetrical on inspiration with no wheezes, rhonchi or rales.  No axillary or supraclavicular lymphadenopathy.  Respirations are nonlabored.  There are decreased breath sounds at both bases.  CARDIOVASCULAR:  Regular rate and rhythm.  There is a very soft systolic murmur on the left sternal border without radiation.  No rubs or gallops.  There is a loud S2.  ABDOMEN:  Soft, nontender.  Bowel sounds x 4.  No hepatosplenomegaly.  No masses palpable.  No abdominal bruits.  GU:  Deferred.  RECTAL:   Deferred.  EXTREMITIES:  No clubbing, cyanosis, or edema.  SKIN:  No ulcerations, normal hair pattern and warm temperature; however, his skin is very dry.  PERIPHERAL PULSES:  Carotids 2+ bilaterally with 1/4 bruit on the left, 2/4 bruit on the right; femorals 2+ bilaterally; popliteals 1+ bilaterally; dorsalis pedis and posterior tibialis 2+ bilaterally.  NEUROLOGIC:  Grossly normal.  Gait normal.  DTRs 2+ bilaterally and muscle strength 5/5 bilaterally.  ASSESSMENT AND PLAN:  [age]-year-old with newly diagnosed right extracranial cerebrovascular occlusive disease, who will undergo right carotid endarterectomy on May 20, 1999 by Dr. Madilyn Fireman.  Dr. Madilyn Fireman has seen and evaluated this patient prior to the admission and has explained the risks and benefits of the procedure and he patient has elected to continue. DD:  05/19/99 TD:  05/19/99 Job: [id number]

## 2010-09-27 ENCOUNTER — Other Ambulatory Visit: Payer: Self-pay | Admitting: Cardiovascular Disease

## 2010-09-27 DIAGNOSIS — I6529 Occlusion and stenosis of unspecified carotid artery: Secondary | ICD-10-CM

## 2010-09-28 ENCOUNTER — Encounter (INDEPENDENT_AMBULATORY_CARE_PROVIDER_SITE_OTHER): Payer: Medicare Other | Admitting: *Deleted

## 2010-09-28 DIAGNOSIS — I6529 Occlusion and stenosis of unspecified carotid artery: Secondary | ICD-10-CM

## 2010-09-30 ENCOUNTER — Encounter: Payer: Self-pay | Admitting: Cardiovascular Disease

## 2010-11-07 ENCOUNTER — Ambulatory Visit (INDEPENDENT_AMBULATORY_CARE_PROVIDER_SITE_OTHER): Payer: Medicare Other | Admitting: Internal Medicine

## 2010-11-07 ENCOUNTER — Encounter: Payer: Self-pay | Admitting: Internal Medicine

## 2010-11-07 VITALS — BP 160/52 | HR 58 | Resp 14 | Ht 71.0 in | Wt 187.0 lb

## 2010-11-07 DIAGNOSIS — R609 Edema, unspecified: Secondary | ICD-10-CM

## 2010-11-07 DIAGNOSIS — I10 Essential (primary) hypertension: Secondary | ICD-10-CM

## 2010-11-07 DIAGNOSIS — I6529 Occlusion and stenosis of unspecified carotid artery: Secondary | ICD-10-CM

## 2010-11-07 NOTE — Progress Notes (Signed)
HPI:  [name] is a 77 y.o. male with a history of severe hypertension, hyperlipidemia, diabetes c/b peripheral neuropathy, right bundle-branch block with chronic bradycardia as well as peripheral vascular disease, status post right carotid endarterectomy and bilateral common iliac stenting and chronic LE edema due to venous insufficiency. CT angiogram has been negative for renal artery stenosis. His therapy has been limited by his bradycardia as well as hyperkalemia and inability to tolerate several antihypertensives most recently cardura which caused symptomatic hypotension.  Lexiscan Myoview recently demonstrated normal perfusion.  We have been struggling to manage his edema. Recently switched to demadex. BNP recently was 69. At last visit HCTZ added.  Edema is much improved. He denies chest pain. He denies significant shortness of breath.  SBP well controlled at home and in Dr. Rinaldo Cloud office. No orthopnea or PND.   Last carotid u/s: R 0-39% L 60-79%  ROS: All systems negative except as listed in HPI, PMH and Problem List.  Past Medical History  Diagnosis Date  . Carotid stenosis     u/s 5/11: R 0-39% L 40-59% (stable)  . HTN (hypertension)     refractory. unable to cardura in past due to hypotension  . Edema   . PAD (peripheral artery disease)   . Cerebrovascular disease, unspecified   . HLD (hyperlipidemia)   . Bradycardia   . Diabetes mellitus type II   . Hyperkalemia     Current Outpatient Prescriptions  Medication Sig Dispense Refill  . amLODipine (NORVASC) 10 MG tablet Take 10 mg by mouth daily.        [name] aspirin 325 MG tablet Take 325 mg by mouth daily.        . cloNIDine (CATAPRES) 0.2 MG tablet Take 0.2 mg by mouth 2 (two) times daily.        . clopidogrel (PLAVIX) 75 MG tablet Take 75 mg by mouth daily.        [name] ezetimibe-simvastatin (VYTORIN) 10-40 MG per tablet Take 1 tablet by mouth at bedtime.        [name] glipiZIDE (GLUCOTROL) 10 MG tablet Take 10 mg by mouth daily.         . hydrALAZINE (APRESOLINE) 50 MG tablet 1 tab po tid      . hydrochlorothiazide 25 MG tablet Take 1 tablet (25 mg total) by mouth daily.  90 tablet  3  . insulin glargine (LANTUS) 100 UNIT/ML injection as directed.        . metFORMIN (GLUCOPHAGE) 1000 MG tablet Take 1,000 mg by mouth 2 (two) times daily with a meal.        . niacin (NIASPAN) 1000 MG CR tablet Take 1,000 mg by mouth 2 (two) times daily.        . pregabalin (LYRICA) 75 MG capsule Take 75 mg by mouth 2 (two) times daily.        . ramipril (ALTACE) 5 MG capsule Take 5 mg by mouth daily.        [name] torsemide (DEMADEX) 20 MG tablet Take 2 tablets (40 mg total) by mouth daily.  60 tablet  11     PHYSICAL EXAM: Filed Vitals:   11/07/10 0815  BP: 160/52  Pulse: 58  Resp: 14   General:  Well appearing. No resp difficulty HEENT: normal Neck: L scar. normal carotid upstrokes without bruits, JVP hard to see appears to be 8-9 Lungs: CTA CV: RRR without murmur. +s4 gallop Abd: soft, NT. Moderately distended no bruit, no organomegaly Ext: no clubbing,  cyanosis, tr-1+ edema.pedals not palpable. Skin: warm and dry without rash      ASSESSMENT & PLAN:

## 2010-11-07 NOTE — Assessment & Plan Note (Signed)
BP well controlled at home but elevated here. Likely white-coat HTN. Will bring him in Friday to calibrate home cuff.

## 2010-11-07 NOTE — Assessment & Plan Note (Signed)
Much improved.  Continue current regimen. 

## 2010-11-07 NOTE — Patient Instructions (Signed)
Nurse Visit on Friday 7/6 bring your home BP cuff Your physician wants you to follow-up in: 6 months.  You will receive a reminder letter in the mail two months in advance. If you don't receive a letter, please call our office to schedule the follow-up appointment.

## 2010-11-07 NOTE — Assessment & Plan Note (Signed)
Stable. Asymptomatic. F/u 1 year.  

## 2010-12-22 ENCOUNTER — Inpatient Hospital Stay (HOSPITAL_COMMUNITY)
Admission: EM | Admit: 2010-12-22 | Discharge: 2010-12-24 | DRG: 069 | Disposition: A | Discharge status: Home / Self Care | Payer: Medicare Other | Attending: Endocrinology | Admitting: Endocrinology

## 2010-12-22 ENCOUNTER — Emergency Department (HOSPITAL_COMMUNITY): Payer: Medicare Other

## 2010-12-22 DIAGNOSIS — I498 Other specified cardiac arrhythmias: Secondary | ICD-10-CM | POA: Diagnosis present

## 2010-12-22 DIAGNOSIS — I2510 Atherosclerotic heart disease of native coronary artery without angina pectoris: Secondary | ICD-10-CM | POA: Diagnosis present

## 2010-12-22 DIAGNOSIS — H532 Diplopia: Secondary | ICD-10-CM | POA: Diagnosis present

## 2010-12-22 DIAGNOSIS — N189 Chronic kidney disease, unspecified: Secondary | ICD-10-CM | POA: Diagnosis present

## 2010-12-22 DIAGNOSIS — R040 Epistaxis: Secondary | ICD-10-CM | POA: Diagnosis present

## 2010-12-22 DIAGNOSIS — Z79899 Other long term (current) drug therapy: Secondary | ICD-10-CM

## 2010-12-22 DIAGNOSIS — E1129 Type 2 diabetes mellitus with other diabetic kidney complication: Secondary | ICD-10-CM | POA: Diagnosis present

## 2010-12-22 DIAGNOSIS — Z7982 Long term (current) use of aspirin: Secondary | ICD-10-CM

## 2010-12-22 DIAGNOSIS — K219 Gastro-esophageal reflux disease without esophagitis: Secondary | ICD-10-CM | POA: Diagnosis present

## 2010-12-22 DIAGNOSIS — Z794 Long term (current) use of insulin: Secondary | ICD-10-CM

## 2010-12-22 DIAGNOSIS — J322 Chronic ethmoidal sinusitis: Secondary | ICD-10-CM | POA: Diagnosis present

## 2010-12-22 DIAGNOSIS — E785 Hyperlipidemia, unspecified: Secondary | ICD-10-CM | POA: Diagnosis present

## 2010-12-22 DIAGNOSIS — N179 Acute kidney failure, unspecified: Secondary | ICD-10-CM | POA: Diagnosis present

## 2010-12-22 DIAGNOSIS — I798 Other disorders of arteries, arterioles and capillaries in diseases classified elsewhere: Secondary | ICD-10-CM | POA: Diagnosis present

## 2010-12-22 DIAGNOSIS — I69998 Other sequelae following unspecified cerebrovascular disease: Secondary | ICD-10-CM

## 2010-12-22 DIAGNOSIS — J0100 Acute maxillary sinusitis, unspecified: Secondary | ICD-10-CM | POA: Diagnosis present

## 2010-12-22 DIAGNOSIS — I4510 Unspecified right bundle-branch block: Secondary | ICD-10-CM | POA: Diagnosis present

## 2010-12-22 DIAGNOSIS — E860 Dehydration: Secondary | ICD-10-CM | POA: Diagnosis present

## 2010-12-22 DIAGNOSIS — I672 Cerebral atherosclerosis: Secondary | ICD-10-CM | POA: Diagnosis present

## 2010-12-22 DIAGNOSIS — I658 Occlusion and stenosis of other precerebral arteries: Secondary | ICD-10-CM | POA: Diagnosis present

## 2010-12-22 DIAGNOSIS — E1142 Type 2 diabetes mellitus with diabetic polyneuropathy: Secondary | ICD-10-CM | POA: Diagnosis present

## 2010-12-22 DIAGNOSIS — D649 Anemia, unspecified: Secondary | ICD-10-CM | POA: Diagnosis present

## 2010-12-22 DIAGNOSIS — E1159 Type 2 diabetes mellitus with other circulatory complications: Secondary | ICD-10-CM | POA: Diagnosis present

## 2010-12-22 DIAGNOSIS — I129 Hypertensive chronic kidney disease with stage 1 through stage 4 chronic kidney disease, or unspecified chronic kidney disease: Secondary | ICD-10-CM | POA: Diagnosis present

## 2010-12-22 DIAGNOSIS — Z7902 Long term (current) use of antithrombotics/antiplatelets: Secondary | ICD-10-CM

## 2010-12-22 DIAGNOSIS — E1149 Type 2 diabetes mellitus with other diabetic neurological complication: Secondary | ICD-10-CM | POA: Diagnosis present

## 2010-12-22 DIAGNOSIS — D696 Thrombocytopenia, unspecified: Secondary | ICD-10-CM | POA: Diagnosis present

## 2010-12-22 DIAGNOSIS — G459 Transient cerebral ischemic attack, unspecified: Principal | ICD-10-CM | POA: Diagnosis present

## 2010-12-22 LAB — URINALYSIS, ROUTINE W REFLEX MICROSCOPIC
Glucose, UA: 100 mg/dL — AB
Hgb urine dipstick: NEGATIVE
Ketones, ur: NEGATIVE mg/dL
Leukocytes, UA: NEGATIVE
Protein, ur: NEGATIVE mg/dL

## 2010-12-22 LAB — COMPREHENSIVE METABOLIC PANEL
ALT: 14 U/L (ref 0–53)
BUN: 40 mg/dL — ABNORMAL HIGH (ref 6–23)
CO2: 28 mEq/L (ref 19–32)
Calcium: 9.6 mg/dL (ref 8.4–10.5)
GFR calc Af Amer: 40 mL/min — ABNORMAL LOW (ref >60)
GFR calc non Af Amer: 33 mL/min — ABNORMAL LOW (ref >60)
Glucose, Bld: 256 mg/dL — ABNORMAL HIGH (ref 70–99)
Sodium: 137 mEq/L (ref 135–145)

## 2010-12-22 LAB — CBC
Platelets: 95 10*3/uL — ABNORMAL LOW (ref 150–400)
RBC: 3.52 MIL/uL — ABNORMAL LOW (ref 4.22–5.81)
WBC: 5.9 10*3/uL (ref 4.0–10.5)

## 2010-12-22 LAB — DIFFERENTIAL
Basophils Absolute: 0 10*3/uL (ref 0.0–0.1)
Basophils Relative: 1 % (ref 0–1)
Neutro Abs: 3.9 10*3/uL (ref 1.7–7.7)
Neutrophils Relative %: 67 % (ref 43–77)

## 2010-12-22 LAB — POCT I-STAT, CHEM 8
BUN: 40 mg/dL — ABNORMAL HIGH (ref 6–23)
HCT: 33 % — ABNORMAL LOW (ref 39.0–52.0)
Hemoglobin: 11.2 g/dL — ABNORMAL LOW (ref 13.0–17.0)
Sodium: 138 mEq/L (ref 135–145)
TCO2: 25 mmol/L (ref 0–100)

## 2010-12-22 LAB — D-DIMER, QUANTITATIVE: D-Dimer, Quant: 0.56 ug/mL-FEU — ABNORMAL HIGH (ref 0.00–0.48)

## 2010-12-23 ENCOUNTER — Inpatient Hospital Stay (HOSPITAL_COMMUNITY): Payer: Medicare Other

## 2010-12-23 DIAGNOSIS — I369 Nonrheumatic tricuspid valve disorder, unspecified: Secondary | ICD-10-CM

## 2010-12-23 LAB — TSH: TSH: 1.158 u[IU]/mL (ref 0.350–4.500)

## 2010-12-23 LAB — BASIC METABOLIC PANEL
Chloride: 101 mEq/L (ref 96–112)
GFR calc Af Amer: 47 mL/min — ABNORMAL LOW (ref >60)
Potassium: 4.3 mEq/L (ref 3.5–5.1)
Sodium: 138 mEq/L (ref 135–145)

## 2010-12-23 LAB — CARDIAC PANEL(CRET KIN+CKTOT+MB+TROPI)
Relative Index: INVALID (ref 0.0–2.5)
Relative Index: INVALID (ref 0.0–2.5)
Relative Index: INVALID (ref 0.0–2.5)
Total CK: 81 U/L (ref 7–232)
Total CK: 74 U/L (ref 7–232)
Troponin I: <0.3 ng/mL (ref <0.30)

## 2010-12-23 LAB — GLUCOSE, CAPILLARY
Glucose-Capillary: 161 mg/dL — ABNORMAL HIGH (ref 70–99)
Glucose-Capillary: 177 mg/dL — ABNORMAL HIGH (ref 70–99)

## 2010-12-23 LAB — CBC
Platelets: 84 10*3/uL — ABNORMAL LOW (ref 150–400)
RDW: 14.5 % (ref 11.5–15.5)
WBC: 4.7 10*3/uL (ref 4.0–10.5)

## 2010-12-23 LAB — HEMOGLOBIN A1C: Mean Plasma Glucose: 126 mg/dL — ABNORMAL HIGH (ref <117)

## 2010-12-24 LAB — BASIC METABOLIC PANEL
BUN: 25 mg/dL — ABNORMAL HIGH (ref 6–23)
CO2: 27 mEq/L (ref 19–32)
GFR calc non Af Amer: 50 mL/min — ABNORMAL LOW (ref >60)
Glucose, Bld: 281 mg/dL — ABNORMAL HIGH (ref 70–99)
Potassium: 4.5 mEq/L (ref 3.5–5.1)

## 2010-12-24 LAB — CBC
MCH: 31 pg (ref 26.0–34.0)
MCV: 91 fL (ref 78.0–100.0)
Platelets: 87 10*3/uL — ABNORMAL LOW (ref 150–400)
RBC: 3.35 MIL/uL — ABNORMAL LOW (ref 4.22–5.81)

## 2010-12-26 ENCOUNTER — Other Ambulatory Visit (HOSPITAL_COMMUNITY): Payer: Self-pay | Admitting: Endocrinology

## 2010-12-26 DIAGNOSIS — I6529 Occlusion and stenosis of unspecified carotid artery: Secondary | ICD-10-CM

## 2010-12-27 ENCOUNTER — Ambulatory Visit (HOSPITAL_COMMUNITY)
Admission: RE | Admit: 2010-12-27 | Discharge: 2010-12-27 | Disposition: A | Discharge status: Home / Self Care | Payer: Medicare Other | Source: Ambulatory Visit | Attending: Endocrinology | Admitting: Endocrinology

## 2010-12-27 ENCOUNTER — Other Ambulatory Visit (HOSPITAL_COMMUNITY): Payer: Self-pay | Admitting: Endocrinology

## 2010-12-27 DIAGNOSIS — Z01812 Encounter for preprocedural laboratory examination: Secondary | ICD-10-CM | POA: Insufficient documentation

## 2010-12-27 DIAGNOSIS — H532 Diplopia: Secondary | ICD-10-CM | POA: Insufficient documentation

## 2010-12-27 DIAGNOSIS — I6529 Occlusion and stenosis of unspecified carotid artery: Secondary | ICD-10-CM

## 2010-12-27 DIAGNOSIS — H538 Other visual disturbances: Secondary | ICD-10-CM | POA: Insufficient documentation

## 2010-12-27 DIAGNOSIS — Z79899 Other long term (current) drug therapy: Secondary | ICD-10-CM | POA: Insufficient documentation

## 2010-12-27 DIAGNOSIS — Z8673 Personal history of transient ischemic attack (TIA), and cerebral infarction without residual deficits: Secondary | ICD-10-CM | POA: Insufficient documentation

## 2010-12-27 DIAGNOSIS — E119 Type 2 diabetes mellitus without complications: Secondary | ICD-10-CM | POA: Insufficient documentation

## 2010-12-27 DIAGNOSIS — I10 Essential (primary) hypertension: Secondary | ICD-10-CM | POA: Insufficient documentation

## 2010-12-27 LAB — CBC
Hemoglobin: 11.3 g/dL — ABNORMAL LOW (ref 13.0–17.0)
MCH: 30.5 pg (ref 26.0–34.0)
RBC: 3.7 MIL/uL — ABNORMAL LOW (ref 4.22–5.81)

## 2010-12-27 LAB — PROTIME-INR
INR: 1.14 (ref 0.00–1.49)
Prothrombin Time: 14.8 seconds (ref 11.6–15.2)

## 2010-12-27 LAB — POCT I-STAT, CHEM 8
Chloride: 107 mEq/L (ref 96–112)
HCT: 30 % — ABNORMAL LOW (ref 39.0–52.0)
Potassium: 4 mEq/L (ref 3.5–5.1)
Sodium: 140 mEq/L (ref 135–145)

## 2010-12-27 LAB — BASIC METABOLIC PANEL
GFR calc Af Amer: 59 mL/min — ABNORMAL LOW (ref >60)
GFR calc non Af Amer: 49 mL/min — ABNORMAL LOW (ref >60)
Glucose, Bld: 79 mg/dL (ref 70–99)
Potassium: 4.2 mEq/L (ref 3.5–5.1)
Sodium: 142 mEq/L (ref 135–145)

## 2010-12-27 LAB — APTT: aPTT: 37 seconds (ref 24–37)

## 2010-12-27 MED ORDER — IOHEXOL 300 MG/ML  SOLN
250.0000 mL | Freq: Once | INTRAMUSCULAR | Status: AC | PRN
  Administered 2010-12-27: 75 mL via INTRAVENOUS

## 2011-01-01 ENCOUNTER — Emergency Department (HOSPITAL_COMMUNITY): Payer: Medicare Other

## 2011-01-01 ENCOUNTER — Inpatient Hospital Stay (HOSPITAL_COMMUNITY)
Admission: EM | Admit: 2011-01-01 | Discharge: 2011-01-02 | Disposition: A | Discharge status: Other Acute Inpatient Hospital | Payer: Medicare Other | Source: Home / Self Care | Attending: Endocrinology | Admitting: Endocrinology

## 2011-01-01 DIAGNOSIS — E785 Hyperlipidemia, unspecified: Secondary | ICD-10-CM | POA: Diagnosis present

## 2011-01-01 DIAGNOSIS — N390 Urinary tract infection, site not specified: Secondary | ICD-10-CM | POA: Diagnosis not present

## 2011-01-01 DIAGNOSIS — I2510 Atherosclerotic heart disease of native coronary artery without angina pectoris: Secondary | ICD-10-CM | POA: Diagnosis present

## 2011-01-01 DIAGNOSIS — Z85828 Personal history of other malignant neoplasm of skin: Secondary | ICD-10-CM

## 2011-01-01 DIAGNOSIS — E1149 Type 2 diabetes mellitus with other diabetic neurological complication: Secondary | ICD-10-CM | POA: Diagnosis present

## 2011-01-01 DIAGNOSIS — Z8673 Personal history of transient ischemic attack (TIA), and cerebral infarction without residual deficits: Secondary | ICD-10-CM

## 2011-01-01 DIAGNOSIS — N183 Chronic kidney disease, stage 3 unspecified: Secondary | ICD-10-CM | POA: Diagnosis present

## 2011-01-01 DIAGNOSIS — E875 Hyperkalemia: Secondary | ICD-10-CM | POA: Diagnosis present

## 2011-01-01 DIAGNOSIS — E8779 Other fluid overload: Secondary | ICD-10-CM | POA: Diagnosis present

## 2011-01-01 DIAGNOSIS — B965 Pseudomonas (aeruginosa) (mallei) (pseudomallei) as the cause of diseases classified elsewhere: Secondary | ICD-10-CM | POA: Diagnosis not present

## 2011-01-01 DIAGNOSIS — K219 Gastro-esophageal reflux disease without esophagitis: Secondary | ICD-10-CM | POA: Diagnosis present

## 2011-01-01 DIAGNOSIS — I129 Hypertensive chronic kidney disease with stage 1 through stage 4 chronic kidney disease, or unspecified chronic kidney disease: Secondary | ICD-10-CM | POA: Diagnosis present

## 2011-01-01 DIAGNOSIS — Z7982 Long term (current) use of aspirin: Secondary | ICD-10-CM

## 2011-01-01 DIAGNOSIS — Z79899 Other long term (current) drug therapy: Secondary | ICD-10-CM

## 2011-01-01 DIAGNOSIS — E871 Hypo-osmolality and hyponatremia: Secondary | ICD-10-CM | POA: Diagnosis present

## 2011-01-01 DIAGNOSIS — Z7902 Long term (current) use of antithrombotics/antiplatelets: Secondary | ICD-10-CM

## 2011-01-01 DIAGNOSIS — E1129 Type 2 diabetes mellitus with other diabetic kidney complication: Secondary | ICD-10-CM | POA: Diagnosis present

## 2011-01-01 DIAGNOSIS — Z794 Long term (current) use of insulin: Secondary | ICD-10-CM

## 2011-01-01 DIAGNOSIS — Z87891 Personal history of nicotine dependence: Secondary | ICD-10-CM

## 2011-01-01 DIAGNOSIS — N179 Acute kidney failure, unspecified: Principal | ICD-10-CM | POA: Diagnosis present

## 2011-01-01 DIAGNOSIS — R319 Hematuria, unspecified: Secondary | ICD-10-CM | POA: Diagnosis not present

## 2011-01-01 DIAGNOSIS — T50995A Adverse effect of other drugs, medicaments and biological substances, initial encounter: Secondary | ICD-10-CM | POA: Diagnosis present

## 2011-01-01 DIAGNOSIS — E1169 Type 2 diabetes mellitus with other specified complication: Secondary | ICD-10-CM | POA: Diagnosis present

## 2011-01-01 DIAGNOSIS — E1142 Type 2 diabetes mellitus with diabetic polyneuropathy: Secondary | ICD-10-CM | POA: Diagnosis present

## 2011-01-01 DIAGNOSIS — I4510 Unspecified right bundle-branch block: Secondary | ICD-10-CM | POA: Diagnosis present

## 2011-01-01 LAB — CBC
MCH: 31 pg (ref 26.0–34.0)
Platelets: 83 10*3/uL — ABNORMAL LOW (ref 150–400)
RBC: 3.32 MIL/uL — ABNORMAL LOW (ref 4.22–5.81)
RDW: 14.2 % (ref 11.5–15.5)
WBC: 7.6 10*3/uL (ref 4.0–10.5)

## 2011-01-01 LAB — URINALYSIS, ROUTINE W REFLEX MICROSCOPIC
Hgb urine dipstick: NEGATIVE
Leukocytes, UA: NEGATIVE
Nitrite: NEGATIVE
Protein, ur: NEGATIVE mg/dL
Specific Gravity, Urine: 1.018 (ref 1.005–1.030)
Urobilinogen, UA: 0.2 mg/dL (ref 0.0–1.0)

## 2011-01-01 LAB — GLUCOSE, CAPILLARY
Glucose-Capillary: 66 mg/dL — ABNORMAL LOW (ref 70–99)
Glucose-Capillary: 104 mg/dL — ABNORMAL HIGH (ref 70–99)
Glucose-Capillary: 44 mg/dL — CL (ref 70–99)
Glucose-Capillary: 89 mg/dL (ref 70–99)

## 2011-01-01 LAB — COMPREHENSIVE METABOLIC PANEL
ALT: 19 U/L (ref 0–53)
Calcium: 8.3 mg/dL — ABNORMAL LOW (ref 8.4–10.5)
Creatinine, Ser: 7.72 mg/dL — ABNORMAL HIGH (ref 0.50–1.35)
GFR calc Af Amer: 8 mL/min — ABNORMAL LOW (ref >60)
Glucose, Bld: 63 mg/dL — ABNORMAL LOW (ref 70–99)
Sodium: 129 mEq/L — ABNORMAL LOW (ref 135–145)
Total Protein: 6 g/dL (ref 6.0–8.3)

## 2011-01-01 LAB — DIFFERENTIAL
Basophils Absolute: 0 10*3/uL (ref 0.0–0.1)
Eosinophils Absolute: 0 10*3/uL (ref 0.0–0.7)
Lymphocytes Relative: 12 % (ref 12–46)
Neutrophils Relative %: 82 % — ABNORMAL HIGH (ref 43–77)

## 2011-01-01 LAB — PROTIME-INR: Prothrombin Time: 16.6 seconds — ABNORMAL HIGH (ref 11.6–15.2)

## 2011-01-01 LAB — PHOSPHORUS: Phosphorus: 5.3 mg/dL — ABNORMAL HIGH (ref 2.3–4.6)

## 2011-01-01 LAB — APTT: aPTT: 39 seconds — ABNORMAL HIGH (ref 24–37)

## 2011-01-01 LAB — CK: Total CK: 104 U/L (ref 7–232)

## 2011-01-01 LAB — MAGNESIUM: Magnesium: 1.9 mg/dL (ref 1.5–2.5)

## 2011-01-02 ENCOUNTER — Inpatient Hospital Stay (HOSPITAL_COMMUNITY)
Admission: AD | Admit: 2011-01-02 | Discharge: 2011-01-08 | DRG: 683 | Disposition: A | Discharge status: Home Health | Payer: Medicare Other | Source: Other Acute Inpatient Hospital | Attending: Nephrology | Admitting: Nephrology

## 2011-01-02 ENCOUNTER — Inpatient Hospital Stay (HOSPITAL_COMMUNITY): Payer: Medicare Other

## 2011-01-02 LAB — CBC
HCT: 26.1 % — ABNORMAL LOW (ref 39.0–52.0)
Hemoglobin: 9.1 g/dL — ABNORMAL LOW (ref 13.0–17.0)
MCH: 31.2 pg (ref 26.0–34.0)
MCHC: 34.9 g/dL (ref 30.0–36.0)
RBC: 2.92 MIL/uL — ABNORMAL LOW (ref 4.22–5.81)

## 2011-01-02 LAB — URINE CULTURE: Colony Count: NO GROWTH

## 2011-01-02 LAB — COMPREHENSIVE METABOLIC PANEL
ALT: 15 U/L (ref 0–53)
Alkaline Phosphatase: 44 U/L (ref 39–117)
BUN: 55 mg/dL — ABNORMAL HIGH (ref 6–23)
CO2: 22 mEq/L (ref 19–32)
GFR calc Af Amer: 8 mL/min — ABNORMAL LOW (ref >60)
GFR calc non Af Amer: 6 mL/min — ABNORMAL LOW (ref >60)
Glucose, Bld: 87 mg/dL (ref 70–99)
Potassium: 5.6 mEq/L — ABNORMAL HIGH (ref 3.5–5.1)
Sodium: 129 mEq/L — ABNORMAL LOW (ref 135–145)
Total Protein: 5.2 g/dL — ABNORMAL LOW (ref 6.0–8.3)

## 2011-01-02 LAB — DIFFERENTIAL
Basophils Relative: 0 % (ref 0–1)
Lymphocytes Relative: 16 % (ref 12–46)
Monocytes Absolute: 0.3 10*3/uL (ref 0.1–1.0)
Monocytes Relative: 6 % (ref 3–12)
Neutro Abs: 4.5 10*3/uL (ref 1.7–7.7)
Neutrophils Relative %: 78 % — ABNORMAL HIGH (ref 43–77)

## 2011-01-02 LAB — GLUCOSE, CAPILLARY
Glucose-Capillary: 69 mg/dL — ABNORMAL LOW (ref 70–99)
Glucose-Capillary: 113 mg/dL — ABNORMAL HIGH (ref 70–99)
Glucose-Capillary: 88 mg/dL (ref 70–99)
Glucose-Capillary: 93 mg/dL (ref 70–99)
Glucose-Capillary: 74 mg/dL (ref 70–99)
Glucose-Capillary: 75 mg/dL (ref 70–99)
Glucose-Capillary: 65 mg/dL — ABNORMAL LOW (ref 70–99)
Glucose-Capillary: 101 mg/dL — ABNORMAL HIGH (ref 70–99)

## 2011-01-02 LAB — HEPATITIS B SURFACE ANTIGEN: Hepatitis B Surface Ag: NEGATIVE

## 2011-01-02 LAB — HEPATITIS B SURFACE ANTIBODY,QUALITATIVE: Hep B S Ab: NEGATIVE

## 2011-01-02 NOTE — Consult Note (Signed)
[name], MOUSEL NO.:  [id number]  MEDICAL RECORD NO.:  [id number]  LOCATION:  1223                         FACILITY:  [facility]  PHYSICIAN:  Maree Krabbe, M.D.DATE OF BIRTH:  11/20/1929  DATE OF CONSULTATION: DATE OF DISCHARGE:                                CONSULTATION   REQUESTING PHYSICIAN:  Mark A. Perini, M.D.  REASON FOR CONSULTATION:  Acute renal failure.  HISTORY OF PRESENT ILLNESS:  The patient is a 77 year old white male with history of diabetes mellitus type 2, longstanding, with neuropathy and chronic kidney disease with baseline creatinine of 1.2-1.4 earlier in 2012. He has also has had a right carotid endarterectomey, bilateral  iliac stents, hyperlipidemia, and hypertension with a malignant  hypertension episode in 2010.  He was taking three blood pressure  medications and three additional diuretics at the time of admission.    The patient was admitted from August 16th through 18th for TIA with left-sided visual complaints and left-sided weakness.  He was discharged and then underwent an outpatient cerebral angiogram, which was done on 08/21.  He received 65 cc of intravenous contrast.  He developed nausea with vomiting over the next few days with generalized weakness, and went to an urgent care center today.  They  sent him to the emergency room where his creatinine was found to be 7.   The urinalysis is unremarkable.  The patient was admitted for further  evaluation and treatment by Dr. Waynard Edwards.  He denies any complaints other than feeling weak and nauseated.  He denies any confusion or jerking of the extremities.  Denies any history of kidney disease in himself or his family members.   Looking back his baseline creatinine has been around 1.2-1.4 for most of 2012.  PAST MEDICAL HISTORY: 1. Diabetes mellitus type 2, longstanding. 2. Hypertension, as above. 3. Coronary artery disease. 4. Peripheral vascular disease with bilateral  iliac stents in 2010. 5. Hyperlipidemia. 6. GERD. 7. Cholelithiasis.  PAST SURGICAL HISTORY: 1. Skin cancer removed in 2008. 2. Squamous cell. 3. Bilateral iliac stents. 4. Right carotid endarterectomy in 2001 and also possible carotid stent placed    on the right.  ALLERGIES: 1. BACTRIM. 2. CIPRO. 3. CARDURA. 4. MORPHINE.  MEDICATIONS ON ADMISSION: 1. Lasix 40 daily. 2. Metolazone 2.5 daily. 3. HCTZ 50 b.i.d. 4. Ramipril 5 daily. 5. Clonidine 0.2 b.i.d. 6. Amlodipine 10 daily. 7. Hydralazine 50 t.i.d.  Also aspirin, Plavix, Lantus, Lyrica, metformin, Niaspan, Flonase, and Vytorin.  SOCIAL HISTORY:  Married, 4 children, retired in 1984, and quit smoking 25 years ago.  FAMILY HISTORY:  Father died of coronary disease at age 69.  Mother died of heart disease in her 75s.  He has brothers with bypass surgery and longstanding diabetes.  No kidney disease.  REVIEW OF SYSTEMS:  Denies any recent fever, chills, sweats, headache, visual change, sore throat, difficulty swallowing, chest pain, shortness of breath, abdominal pain, or diarrhea.  Difficulty voiding, dysuria, change in urine color, or quantity.  He does note swelling of the extremities.  No joint pain or swelling.  No focal numbness or weakness.  PHYSICAL EXAMINATION:  VITAL SIGNS:  Blood pressure is 110/50, pulse  50, respirations 16, O2 sat 98% on room air, and temperature is 97 rectal. GENERAL:  The patient is lying in bed about 30 degrees, alert and oriented.  No distress. SKIN:  Warm and dry without rash, cyanosis, gangrene, or ulceration. HEENT:  PERRLA, EOMI.  Throat is clear and moist. NECK:  Supple with distended neck veins to about 15 cm JVD. CHEST:  He has crackles at the both bases. CARDIAC:  Regular rate and rhythm without murmur, rub, or gallop. ABDOMEN:  Soft and nontender.  Hypoactive bowel sounds.  No masses.  No organomegaly, but he does have abdominal obesity. GU:  There is a Foley catheter in  place. EXTREMITIES:  There is 1-2+ pitting and pretibial edema in both legs and below the knees. NEUROLOGIC:  Awake, alert, and oriented x3.  There is some slight weakness of the left leg with dorsiflexion and plantarflexion.  No weakness of the left arm compared to the right.  Good strength, otherwise no focal cranial nerve deficit that are obvious.  LABS:  Hemoglobin 10, white blood count 7000, and platelets 83,000. Sodium 129, potassium 5.2, CO2 of 19, BUN 53, and creatinine 7.72.  LFTs are normal.  Albumin 3.1, calcium 8.3, phosphorus 5.3, and magnesium 1.9.  Urinalysis shows negative for protein, blood, leukocyte esterase, nitrate, or glucose.  INR is 1.32.  CT of the abdomen done today without contrast showed no acute findings in the abdomen or pelvis.  Plain films of the abdomen showed distended small bowel loops with fluid levels suggesting possible early ileus or obstruction.  There is no acute cardiopulmonary disease.  IMPRESSION: 1. Acute kidney injury due to contrast nephropathy after recent     cerebral arteriogram.  The patient is volume overloaded. 2. Chronic kidney disease stage III, baseline creatinine 1.2-1.4,     likely due to diabetes and hypertension. 3. Diabetes with neuropathy. 4. Hypertension on multiple blood pressure medications and diuretics. 5. Recent TIA, status post cerebral angiogram on 08/21. 6. Hyperlipidemia. 7. History of PVD with bilateral iliac stents. 8. History of coronary artery disease. 9. Past smoker.  RECOMMENDATIONS:  Stop IV fluids.  Check urine sodium.  Will give 1 or 2 doses of IV Lasix.  No need for urgent dialysis at this time.  The patient should recover function.  We will follow.  He may require dialysis soon if his renal function does not recover rapidly.  Agree with holding ACE inhibitor and oral diuretics at this time.     Maree Krabbe, M.D.     RDS/MEDQ  D:  01/01/2011  T:  01/02/2011  Job:   [id number]  Electronically Signed by Delano Metz M.D. on 01/02/2011 07:57:35 AM

## 2011-01-03 ENCOUNTER — Inpatient Hospital Stay (HOSPITAL_COMMUNITY): Payer: Medicare Other

## 2011-01-03 LAB — RENAL FUNCTION PANEL
Albumin: 3 g/dL — ABNORMAL LOW (ref 3.5–5.2)
Calcium: 8 mg/dL — ABNORMAL LOW (ref 8.4–10.5)
GFR calc Af Amer: 8 mL/min — ABNORMAL LOW (ref >60)
GFR calc non Af Amer: 6 mL/min — ABNORMAL LOW (ref >60)
Phosphorus: 6.3 mg/dL — ABNORMAL HIGH (ref 2.3–4.6)
Sodium: 134 mEq/L — ABNORMAL LOW (ref 135–145)

## 2011-01-03 LAB — CBC
MCH: 30.1 pg (ref 26.0–34.0)
MCHC: 34.6 g/dL (ref 30.0–36.0)
Platelets: 94 10*3/uL — ABNORMAL LOW (ref 150–400)
RBC: 3.55 MIL/uL — ABNORMAL LOW (ref 4.22–5.81)
RDW: 14.3 % (ref 11.5–15.5)

## 2011-01-03 LAB — URINALYSIS, ROUTINE W REFLEX MICROSCOPIC
Nitrite: NEGATIVE
Specific Gravity, Urine: 1.007 (ref 1.005–1.030)
Urobilinogen, UA: 0.2 mg/dL (ref 0.0–1.0)
pH: 7.5 (ref 5.0–8.0)

## 2011-01-03 LAB — URINE MICROSCOPIC-ADD ON

## 2011-01-03 LAB — GLUCOSE, CAPILLARY
Glucose-Capillary: 56 mg/dL — ABNORMAL LOW (ref 70–99)
Glucose-Capillary: 124 mg/dL — ABNORMAL HIGH (ref 70–99)

## 2011-01-03 LAB — HEPATITIS B CORE ANTIBODY, TOTAL: Hep B Core Total Ab: NEGATIVE

## 2011-01-04 LAB — GLUCOSE, CAPILLARY
Glucose-Capillary: 175 mg/dL — ABNORMAL HIGH (ref 70–99)
Glucose-Capillary: 192 mg/dL — ABNORMAL HIGH (ref 70–99)
Glucose-Capillary: 197 mg/dL — ABNORMAL HIGH (ref 70–99)

## 2011-01-04 LAB — RENAL FUNCTION PANEL
Albumin: 2.7 g/dL — ABNORMAL LOW (ref 3.5–5.2)
BUN: 38 mg/dL — ABNORMAL HIGH (ref 6–23)
Chloride: 98 mEq/L (ref 96–112)
GFR calc Af Amer: 9 mL/min — ABNORMAL LOW (ref >60)
Glucose, Bld: 207 mg/dL — ABNORMAL HIGH (ref 70–99)
Phosphorus: 5.1 mg/dL — ABNORMAL HIGH (ref 2.3–4.6)
Potassium: 4.2 mEq/L (ref 3.5–5.1)
Sodium: 137 mEq/L (ref 135–145)

## 2011-01-05 LAB — GLUCOSE, CAPILLARY
Glucose-Capillary: 210 mg/dL — ABNORMAL HIGH (ref 70–99)
Glucose-Capillary: 184 mg/dL — ABNORMAL HIGH (ref 70–99)
Glucose-Capillary: 154 mg/dL — ABNORMAL HIGH (ref 70–99)

## 2011-01-05 LAB — RENAL FUNCTION PANEL
BUN: 48 mg/dL — ABNORMAL HIGH (ref 6–23)
CO2: 26 mEq/L (ref 19–32)
Chloride: 102 mEq/L (ref 96–112)
Creatinine, Ser: 6.62 mg/dL — ABNORMAL HIGH (ref 0.50–1.35)
GFR calc non Af Amer: 8 mL/min — ABNORMAL LOW (ref >60)
Glucose, Bld: 196 mg/dL — ABNORMAL HIGH (ref 70–99)

## 2011-01-05 LAB — URINE CULTURE: Colony Count: >=100000

## 2011-01-05 LAB — CBC
HCT: 26.9 % — ABNORMAL LOW (ref 39.0–52.0)
Hemoglobin: 9.1 g/dL — ABNORMAL LOW (ref 13.0–17.0)
MCH: 30.2 pg (ref 26.0–34.0)
MCHC: 33.8 g/dL (ref 30.0–36.0)
MCV: 89.4 fL (ref 78.0–100.0)
RBC: 3.01 MIL/uL — ABNORMAL LOW (ref 4.22–5.81)

## 2011-01-06 LAB — CBC
HCT: 27.9 % — ABNORMAL LOW (ref 39.0–52.0)
Hemoglobin: 8.9 g/dL — ABNORMAL LOW (ref 13.0–17.0)
Hemoglobin: 9.4 g/dL — ABNORMAL LOW (ref 13.0–17.0)
MCH: 30.6 pg (ref 26.0–34.0)
MCH: 30.3 pg (ref 26.0–34.0)
MCHC: 33.7 g/dL (ref 30.0–36.0)
MCV: 90 fL (ref 78.0–100.0)
Platelets: 72 10*3/uL — ABNORMAL LOW (ref 150–400)
Platelets: 93 10*3/uL — ABNORMAL LOW (ref 150–400)
RBC: 2.91 MIL/uL — ABNORMAL LOW (ref 4.22–5.81)
RBC: 3.1 MIL/uL — ABNORMAL LOW (ref 4.22–5.81)
RDW: 14.4 % (ref 11.5–15.5)
WBC: 9.4 10*3/uL (ref 4.0–10.5)

## 2011-01-06 LAB — RENAL FUNCTION PANEL
Albumin: 2.5 g/dL — ABNORMAL LOW (ref 3.5–5.2)
BUN: 56 mg/dL — ABNORMAL HIGH (ref 6–23)
Creatinine, Ser: 4.98 mg/dL — ABNORMAL HIGH (ref 0.50–1.35)
GFR calc non Af Amer: 11 mL/min — ABNORMAL LOW (ref >60)
Phosphorus: 4.6 mg/dL (ref 2.3–4.6)
Potassium: 3.5 mEq/L (ref 3.5–5.1)

## 2011-01-06 LAB — GLUCOSE, CAPILLARY
Glucose-Capillary: 172 mg/dL — ABNORMAL HIGH (ref 70–99)
Glucose-Capillary: 159 mg/dL — ABNORMAL HIGH (ref 70–99)

## 2011-01-06 NOTE — H&P (Addendum)
NAMEKJUAN, SEIPP                ACCOUNT NO.:  [id number]  MEDICAL RECORD NO.:  [id number]  LOCATION:  WLED                         FACILITY:  [facility]  PHYSICIAN:  Devinne Epstein A. Malaak Stach, M.D.   DATE OF BIRTH:  1930-01-05  DATE OF ADMISSION:  01/01/2011 DATE OF DISCHARGE:                             HISTORY & PHYSICAL   CHIEF COMPLAINT:  Weakness, diarrhea, and vomiting.  HISTORY OF PRESENT ILLNESS:  Graden is a 77 year old gentleman who recently was admitted from August 16 to August 18 for TIA symptoms.  He underwent a cerebral angiogram 2 days ago, which showed patent right carotid stent and no other critical stenoses.  He then developed weakness in the last 24 hours.  He has had some associated diarrhea and vomiting today.  He denies any fever.  He denies any abdominal pain, chest pain, or cough.  He went to a prime care and then was sent the [facility] Emergency Room where he was found to have significant acute renal failure and plain film shows evidence of ileus versus obstruction. He will therefore be admitted.  PAST MEDICAL HISTORY: 1. Type 2 diabetes since 1992 with neurologic renal and vascular     complications. 2. Diabetic neuropathy. 3. Atherosclerotic coronary disease. 4. Carotid stenosis status post right carotid endarterectomy in 2001     and then had possibly a stent placed at some point. 5. Peripheral vascular disease status post bilateral iliac stents in     2010. 6. Hypertension with malignant hypertension in October 2010. 7. Hyperlipidemia. 8. Gastroesophageal reflux disease. 9. Chronic right bundle branch block. 10.Cholelithiasis. 11.Ectasia of the abdominal aorta measuring up to 2.8 cm. 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 13.History of elbow fracture. 14.Squamous cell skin cancer removed in 2008.  In October 2010, he     underwent bilateral common iliac stenting with balloon expandable     stents.  ALLERGIES:   BACTRIM, CIPRO, CARDURA, and MORPHINE.  MEDICATIONS: 1. Lasix 40 mg daily. 2. Vytorin 10/40 daily. 3. Flonase daily. 4. Ramipril 5 mg daily. 5. Plavix 75 mg daily. 6. Niaspan 1000 mg twice a day. 7. Metolazone 2.5 mg daily. 8. Metformin 1000 mg twice a day. 9. Lyrica 75 mg twice a day. 10.Lantus insulin 58 units at bedtime. 11.Hydrochlorothiazide 50 mg twice a day. 12.Hydralazine 50 mg p.o. t.i.d., he was discharged on this, however,     the med rec sheet does not reflect that. 13.Glipizide XL 10 mg daily. 14.Folic acid 1 mg daily. 15.Clonidine 0.2 mg twice a day. 16.Aspirin 325 mg daily. 17.Amlodipine 10 mg daily.  SOCIAL HISTORY:  Sadler is married, 4 children, 7 grandchildren.  Retired in 1984.  No tobacco.  He did serve in the [employer] during the [place] [employer].  FAMILY HISTORY:  Father died at age 30 of coronary disease.  Mother died of coronary disease at age 58.  Brothers had bypass, another brother has had longstanding diabetes.  REVIEW OF SYSTEMS:  No blood noted above or below, otherwise per the history of present illness.  PHYSICAL EXAMINATION:  VITAL SIGNS:  Blood pressure 108/52, pulse 51, respiratory rate 16, 98%  saturation on room air, temperature 97.4 rectally. GENERAL:  He is sitting semi-supine in the stretcher.  He is alert and oriented, in no acute distress. NECK:  There is no JVD. LUNGS:  Clear to auscultation bilaterally with no wheezes, rales, or rhonchi. HEART:  Regular rate and rhythm with no significant murmur, rub, or gallop. ABDOMEN:  Distended with hypoactive bowel sounds.  It is nontender. There are no masses.  No hepatosplenomegaly.  He has no clubbing, cyanosis, or edema.  LABORATORY DATA:  Urinalysis shows specific gravity 1.018, trace ketones, lactic acid is normal at 1.8.  Sodium 129, potassium 5.2, chloride 95, CO2 of 19, glucose 63, BUN 53, creatinine 7.72, GFR 7, total bili 0.2, alk phos 53, AST 20, ALT 19, total protein 6, albumin 3.1,  calcium 8.3.  On December 27, 2010, BUN was 25 and creatinine was 1.4, and GFR of 49 today.  Today, PTT is 39, INR is 1.32.  White count 7.6, hemoglobin 10.3, platelet count 83,000.  There are 82% segs, 12% lymphocytes, 6% monocytes.  On December 27, 2010, platelet count was 104,000 with a white count of 5.9 and a hemoglobin of 11.3.  Acute abdominal series shows distended small bowel loops with fluid level suggesting early ileus or obstruction.  No acute cardiopulmonary disease.  ASSESSMENT/PLAN:  77 year old gentleman with complicated type 2 diabetes with a recent TIA and angiogram study 2 days ago, now presenting with acute renal failure.  He may have some sort of contrast nephropathy.  He may have had some sort of cholesterol embolization syndrome. Furthermore, he is on extensive diuretics as well as ACE inhibitor therapy chronically and if he has had any volume shifts, this could affect his kidney function as well.  CT scan of the abdomen is pending, so we will see if he has any true evidence of obstruction.  We will place him on clear liquid diet.  We will place him on sliding scale insulin.  We will discontinue most of his medications including his diuretics, his ACE inhibitor, his other antihypertensive as his blood pressure is somewhat low.  We will continue him on aspirin, Plavix, and sliding scale of insulin for now.  We will hydrate him with normal saline.  He has received a couple of liters of fluid in the ER.  We will check his lab work daily.  Furthermore, we will ask for a nephrology consult given his profound acute renal failure.  He is a full code status.  His prognosis is guarded.     Lenetta Piche A. Chasitee Zenker, M.D.   ______________________________ Redge Gainer. Waynard Edwards, M.D.    MAP/MEDQ  D:  01/01/2011  T:  01/01/2011  Job:  [id number]  Electronically Signed by Rodrigo Ran M.D. on 01/27/2011 09:17:41 AM

## 2011-01-07 LAB — RENAL FUNCTION PANEL
CO2: 27 mEq/L (ref 19–32)
Calcium: 8.7 mg/dL (ref 8.4–10.5)
GFR calc Af Amer: 22 mL/min — ABNORMAL LOW (ref >60)
GFR calc non Af Amer: 19 mL/min — ABNORMAL LOW (ref >60)
Phosphorus: 2.9 mg/dL (ref 2.3–4.6)
Potassium: 3.8 mEq/L (ref 3.5–5.1)
Sodium: 146 mEq/L — ABNORMAL HIGH (ref 135–145)

## 2011-01-07 LAB — GLUCOSE, CAPILLARY
Glucose-Capillary: 159 mg/dL — ABNORMAL HIGH (ref 70–99)
Glucose-Capillary: 190 mg/dL — ABNORMAL HIGH (ref 70–99)

## 2011-01-08 LAB — BASIC METABOLIC PANEL
BUN: 51 mg/dL — ABNORMAL HIGH (ref 6–23)
Chloride: 108 mEq/L (ref 96–112)
Glucose, Bld: 198 mg/dL — ABNORMAL HIGH (ref 70–99)
Potassium: 3.6 mEq/L (ref 3.5–5.1)

## 2011-01-09 LAB — CULTURE, BLOOD (ROUTINE X 2)
Culture  Setup Time: 201208282338
Culture: NO GROWTH
Culture: NO GROWTH

## 2011-01-17 NOTE — H&P (Signed)
NAMEABDULLAHI, VALLONE NO.:  [id number]  MEDICAL RECORD NO.:  [id number]  LOCATION:  1519                         FACILITY:  [facility]  PHYSICIAN:  Kari Baars, M.D.  DATE OF BIRTH:  [birth year]  DATE OF ADMISSION:  12/22/2010 DATE OF DISCHARGE:                             HISTORY & PHYSICAL   PRIMARY CARE PHYSICIAN:  Tera Mater. [name], M.D.  CHIEF COMPLAINT:  Double vision and shortness of breath.  HISTORY OF PRESENT ILLNESS:  [name] is a 77 year old white male with a history of diabetes mellitus with neurologic, renal, and vascular complications; coronary artery disease; peripheral vascular disease; carotid stenosis, who presented to the emergency department with a complaint of shortness of breath and double vision.  He reports that he had a severe left leg cramp last evening.  This morning, after awakening, he felt weak and states that he had difficulty brushing his teeth due to his right hand discoordination.  These symptoms improved after he ate breakfast.  Of note, his sugar was 67 before he ate.  He proceeded to go play golf in [place], where he around the pit hole developed some recurrent generalized weakness and epistaxis.  He has had a couple episodes of epistaxis recently.  Subsequently, he noticed that he was seeing three golf balls at once.  These symptoms began around 11:00 a.m..  He managed to drive himself home by covering one of his eyes and ate lunch at around one, when he began feeling better. However, he subsequently became very short of breath.  He was having difficulty catching his breath, so EMS was called.  Upon their arrival, his blood pressure was over 200/100.  They did check his sugar, but he is not sure what it was.  He was brought to the emergency department where his blood pressure was initially 157/47.  He subsequently has begun to feel better at this point, but given his intermittent symptoms throughout the day, he is being  admitted for further management.  REVIEW OF SYSTEMS:  All systems are reviewed with the patient are negative except that is in the HPI with following exceptions:  He does endorse chronic sinus congestion and chronic dry mouth.  He has noted since January 2012, that he has been dragging his left leg.  All other systems are negative.  PAST MEDICAL HISTORY: 1. Diabetes mellitus with neurologic, renal, vascular complications. 2. Diabetic neuropathy. 3. Coronary artery disease. 4. Carotid stenosis, status post right carotid endarterectomy (January     2001).  He reports that he had a recent carotid study done at [facility] in June 2012, which showed no significant     blockage. 5. Peripheral vascular disease, status post bilateral iliac stent     (October 2010). 6. Hypertension with a history of hospitalization for malignant     hypertension in October 2010. 7. Hyperlipidemia. 8. Gastroesophageal reflux disease. 9. Chronic right bundle-branch block.  CURRENT MEDICATIONS: 1. Glipizide XR 10 mg daily. 2. Aspirin 325 mg daily. 3. Lantus 58 units subcu q.h.s. 4. Vytorin 10/40 daily. 5. Clonidine 0.2 mg b.i.d. 6. Furosemide 40 mg daily. 7. Niaspan 1000 mg b.i.d. 8.  Amlodipine 10 mg daily. 9. Plavix 75 mg daily. 10.Metformin 1000 mg b.i.d. 11.Lyrica 75 mg b.i.d. 12.Metolazone 2.5 mg p.r.n. 13.Ramipril 5 mg p.o. daily. 14.HCTZ 50 mg b.i.d. 15.Hydralazine 50 mg q.8 hours.  ALLERGIES:  BACTRIM, CIPRO, and MORPHINE are listed in his chart, although he does not recall any allergies.  SOCIAL HISTORY:  He is married with 4 children and 7 grandchildren.  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  Denies tobacco use.  FAMILY HISTORY:  Father died of coronary disease at 11.  Mother died of coronary disease at 65.  He has a brother with diabetes and another brother with coronary artery disease, status post CABG.  PHYSICAL  EXAMINATION:  VITAL SIGNS:  Temperature 97.6.  Blood pressure initially 157/45 and subsequently 142/47.  Pulse initially 91, currently 67.  Oxygen saturation 96% on room air. GENERAL:  Pleasant gentleman, in no acute distress.  He does appear to have some nasal congestion. HEENT:  Pupils equal, round, and reactive to light.  Extraocular movements are intact.  Oropharynx is dry. NECK:  Supple without lymphadenopathy or JVD.  He does have a left carotid bruit. HEART:  Regular rate and rhythm without murmurs, rubs, or gallops. LUNGS:  Clear to auscultation bilaterally. ABDOMEN:  Soft, nondistended, and nontender with normoactive bowel sounds. EXTREMITIES:  Have chronic peripheral vascular changes, but no clubbing, cyanosis, or edema. NEUROLOGIC:  Alert and oriented x4.  Motor strength is 5/5 in all extremities.  Cranial nerves II through XII are intact.  Visual fields are intact.  LABORATORY DATA:  CBC shows a white count 5.9, hemoglobin 10.9, and platelets 95.  BMET significant for sodium 137, potassium 4.5, chloride 97, bicarb 28, BUN 40, creatinine 1.98, glucose 256, D-dimer 0.56, troponin 0.00.  Of note, his baseline creatinine was 1.3 in April 2012 and 1.7 in June 2012.  STUDIES: 1. EKG personally reviewed shows right bundle-branch block which is     old.  Otherwise, no significant change. 2. Head CT shows an acute right maxillary sinusitis with chronic     ethmoid sinusitis.  Small lacunar infarct versus dilated     perivascular space in the right lentiform nucleus.  No other acute     intracranial abnormalities. 3. Chest x-ray - no acute cardiopulmonary disease.  ASSESSMENT/PLAN: 1. Possible transient ischemic attack - his transient diplopia may be     secondary to a transient ischemic attack in the setting of     malignant hypertension versus hypoglycemia.  He did have     symptomatic improvement with eating and had a documented low blood     sugar earlier this morning.   He will be admitted for a transient     ischemic attack workup with MRI/MRA of the brain, echocardiogram,     and cardiac monitoring on telemetry.  I will hold off on repeating     his carotid Dopplers as he had these done recently at the [facility]     [facility].  We will continue his aspirin, Plavix, and blood     pressure control. 2. Malignant hypertension - suspected shortness of breath this     afternoon and possibly his diplopia was due to his markedly     elevated blood pressure.  This is currently better controlled and     will be monitored on his home medications.  Consider renal artery     evaluation given his worsening renal function. 3. Acute on chronic  renal failure - we will hydrate gently and     consider renal artery studies.  Hold his metformin.  We will     continue his ACE inhibitor, but will need to monitor this closely.     Diuretics will be put on hold.  Obtain urinalysis. 4. Diabetes mellitus with hypoglycemia - he did have a transient     episode of hypoglycemia this morning.  We will decrease his Lantus     to 50 units daily and cover with sliding scale insulin.  We will     hold his metformin due to his elevated creatinine. 5. Acute sinusitis with epistaxis - we will start Augmentin and use    saline and Flonase nasal sprays. 6. Thrombocytopenia/anemia - we will monitor.  This may be related to     his chronic epistaxis in the setting of aspirin and Plavix therapy. 7. Disposition - anticipate possible discharge late tomorrow afternoon     or Saturday if his workup is negative and he remains stable.     Kari Baars, M.D.     WS/MEDQ  D:  12/22/2010  T:  12/23/2010  Job:  [id number]  cc:   Jeannett Senior A. Evlyn Kanner, M.D. Fax: [phone]  Bevelyn Buckles. Bensimhon, MD [address] [place] [address]  Electronically Signed by Lacretia Nicks. Buren Kos M.D. on 01/17/2011 09:42:03 PM

## 2011-01-17 NOTE — Discharge Summary (Signed)
NAMEPHILLIPS, GOULETTE NO.:  [id number]  MEDICAL RECORD NO.:  [id number]  LOCATION:  1519                         FACILITY:  [facility]  PHYSICIAN:  Tera Mater. Evlyn Kanner, M.D. DATE OF BIRTH:  02-Oct-1929  DATE OF ADMISSION:  12/22/2010 DATE OF DISCHARGE:                              DISCHARGE SUMMARY   DATE OF ANTICIPATED DISCHARGE:  December 24, 2010  DISCHARGE DIAGNOSES: 1. Transient ischemic attack, now resolved without sequelae. 2. Silent stroke noted on MRI. 3. Possible middle cerebral artery stenosis as well as anterior     cerebral artery stenoses, angiogram planned. 4. Type 2 diabetes, clinically stable. 5. Hypertension, under good control. 6. Type 2 diabetes with reasonable control. 7. Transient bradycardia without symptoms. 8. Hyperlipidemia. 9. Epistaxis without significant anemia. 10.Known carotid stenosis. 11.Peripheral vascular disease. 12.Gastroesophageal reflux.  CONSULTATIONS:  Invasive Radiology.  PROCEDURES INCLUDED:  CT of the head and MRI and MRA of the head.  HISTORY:  [name] is a 74 year old white male, a long-standing patient of my practice who was admitted by my partner, Dr. Clelia Croft, on the evening of December 22, 2010.  He presented with some right arm weakness and diplopia and vision change.  This had resolved at the time of hospitalization.  He had been clinically stable from a neurologic standpoint since being here.  Initial CT did not show any issues.  An MRI and MRA, however, did show encephalomalacia on the right frontal lobe suggesting an old stroke with an A1 and A2 stenoses as well as a review by Dr. Corliss Skains suggested a middle cerebral artery branch stenosis.  Angiography is planned as an outpatient to look at this.  The patient is doing well.  He has had some transient bradycardia, which he has tolerated.  His blood pressure has been under good control and his blood sugars are doing well.  His neurologic symptoms have not  recurred. He remains on Plavix and aspirin.  I think he is clinically stable and can go home.  We have no indication of Coumadin at the present time.  We are hopeful that this lesion in the middle cerebral artery might be amenable to a stent.  At the present time, he is feeling well.  He has been ambulating.  His grip in his right hand is normal.  He has blood pressure this morning at 137/58.  His fasting blood sugar is 132.  DISCHARGE MEDICATIONS: 1. Amlodipine 10 mg daily. 2. Aspirin 325 mg daily. 3. Plavix 75 mg daily. 4. Clonidine 0.2 mg daily. 5. Folic acid 1 mg daily. 6. Lasix 40 mg daily. 7. Glipizide XL 10 mg daily. 8. HCTZ 50 mg twice daily. 9. Lantus 58 units at bedtime that actually can be reduced down to 55     units. 10.Lyrica 75 mg twice daily. 11.Metformin 1000 mg twice daily. 12.Metolazone 2.5 mg daily. 13.Niaspan 1000 mg twice daily. 14.Ramipril 5 mg daily. 15.Vytorin 10/40 daily.  His diet will be no concentrated sweets, no pain management is necessary.  He is to see me in a week to 10 days.  He is to have the set up of the angiogram by Invasive Radiology.  LABORATORY DATA:  CKs were normal with normal troponins on December 23, 2010.  His BMET on the December 23, 2010, sodium 138, potassium 4.3, chloride 101, CO2 28, BUN 36, creatinine 1.72, glucose of 79, estimated GFR is 39, calcium 8.5, white count is 4700, hemoglobin 10.1, and platelets 84,000.  His A1c was 6%.  His TSH is 1.158.  Cardiac panel was normal.  Urinalysis was negative.  D-dimer was 0.56, which is minimally elevated.  His presentation white count was 5900, hemoglobin 10.9, and platelets 95,000.  His initial creatinine is 1.98.  His radiology testing MRI, MRA of the brain shows encephalomalacia of the right inferior frontal lobe due to prior trauma or infarct.  No acute infarct was seen.  The MRA showed hypoplastic right P1 segment, hypoplastic right A1 segment, and severe focal stenosis proximal  right A2 segment middle cerebral artery did show some changes on the left side per Dr. Corliss Skains when he looked at it.  CT of the head at presentation, small remote lacunar infarct in the right lentiform nucleus and some maxillary sinusitis.  Chest x-rays, no acute cardiopulmonary disease.  In summary, we have a 77 year old male having a TIA, now resolved with planned workup as noted.          ______________________________ Tera Mater Evlyn Kanner, M.D.     SAS/MEDQ  D:  12/24/2010  T:  12/24/2010  Job:  [id number]  Electronically Signed by Adrian Prince M.D. on 01/17/2011 01:30:18 PM

## 2011-02-01 NOTE — Discharge Summary (Signed)
[name], [name]                ACCOUNT NO.:  [id number]  MEDICAL RECORD NO.:  [id number]  LOCATION:                                 FACILITY:  PHYSICIAN:  Larina Earthly, M.D.        DATE OF BIRTH:  [birth year]  DATE OF ADMISSION:  01/01/2011 DATE OF DISCHARGE:  01/08/2011                              DISCHARGE SUMMARY   DISCHARGE DIAGNOSES: 1. Acute renal failure secondary to contrast induced nephropathy     slowly resolving, status post hemodialysis x2 with discharge     creatinine now being 2.49, previously 8. 2. Pseudomonas urinary tract infection after presenting with abdominal     pain and fever.  CT scan negative.  Ultrasound unremarkable of the     abdomen.  Cultures positive as above.  Blood cultures negative.     The patient treated with Cipro and Fortaz during hospitalization to     be discharged on Cipro. 3. Type 2 diabetes, insulin dependent with significant decrease in     caloric intake, the patient to be discharged on a much reduced     insulin regimen off of sulfonylurea and metformin. 4. Anemia.  Outpatient workup presumably complicated by acute renal     failure and lack of erythropoietin naturally. 5. Hypertension.  Blood pressure reasonable off of multiple     medications in the setting of acute renal failure, will need to     have these readded as indicated on an outpatient basis.  SECONDARY DIAGNOSES:  Please see history and physical dictated by Dr. Rodrigo Ran for extensive information, this discharge summary is somewhat abbreviated secondary to care of the patient primarily by the Nephrology Service up until this morning when I was asked to discharge the patient.  DISCHARGE MEDICATIONS: 1. Tylenol 650 mg every 6 hours as needed. 2. Cipro 500 mg daily x7 days, prescription given. 3. Insulin 10 units once a day. 4. Aspirin 325 mg daily. 5. Clonidine 0.2 mg p.o. b.i.d. 6. Fluticasone nasal spray, 1 spray in each nostril once daily. 7. Folic acid 1 mg  daily. 8. Lyrica 75 mg once per day. 9. Plavix 75 mg per day.  Please note that the patient was instructed to discontinue the following medications pending outpatient followup: 1. Amlodipine 10 mg. 2. Metformin 1000 mg p.o. b.i.d. 3. Glipizide XL 10 mg daily. 4. Niaspan 1000 mg twice daily. 5. Lantus previously 58 units each day. 6. Vytorin 10/40 once a day. 7. Metolazone 2.5 mg daily. 8. Ramipril 5 mg daily. 9. Hydrochlorothiazide 50 mg twice daily. 10.Lasix 40 mg daily.  The patient was instructed to call Dr. Evlyn Kanner for followup in 5-8 days at which time he will need reassessment of renal parameters and further workup and monitoring of anemia.  The patient was instructed to monitor blood pressure 2 times a day given that he has his own blood pressure cuff and recorded on paper for Dr. Rinaldo Cloud review.  He was also instructed to monitor sugars 3 times a day prior to meals again recording on paper and if he is to experience any significant escalations and blood pressure or blood sugar to call our office during  business hours for further adjustment of medications unless it is under an emergent situation.  Most recent labs from January 06, 2011, white blood cell count 9.4 down from 12.2, hemoglobin 9.4, hematocrit 27.9%, platelet count 93,000, which is stable.  On the day of discharge, sodium 148, potassium 3.6, BUN 51, creatinine 2.49, serum CO2 of 25, glucose 198, calcium 8.7.  Please note that C. diff for stool was negative. Hemoglobin A1c was 6.4%.  Hepatitis B profile was unremarkable.  Lactic acid on admission was 1.8.  Chest x-ray reveals no acute disease. Ultrasound of the renal parenchyma normal.  The renal system unremarkable.  CT of the abdomen without contrast was unremarkable with no evidence of abdominal infection or source of fever.  Urinalysis on August 28 was positive for evidence of infection with cultures growing out Pseudomonas which was pansensitive.  HISTORY OF  PRESENT ILLNESS:  Please see the physical history and physical dictated by Dr. Rodrigo Ran on January 01, 2011, however, this is a 76 year old gentleman who has a past medical history for type 2 diabetes with microvascular and macrovascular complications, coronary artery disease, peripheral vascular disease, hypertension, hyperlipidemia, gastroesophageal reflux disease, chronic right bundle branch block on multiple medications as above who presented with recent TIA and angiogram study 2 days prior to admission, now presented with acute renal failure presumably secondary to contrast nephropathy.  He was admitted for further evaluation and management.  HOSPITAL COURSE:  The patient was consulted on by Nephrology who after discussion with Dr. Evlyn Kanner agreed to take over care.  They did perform hemodialysis x2 and also noted to have abdominal pain with subsequent development of Pseudomonas urinary tract infection as above.  Dr. Evlyn Kanner did follow for his diabetic needs.  On January 07, 2011, I was called by Nephrology saying that they were signing off and that the patient will need followup with Internal Medicine and to please discharge the patient the next day.  On doing so at this point, I spent greater than 30-45 minutes reviewing the chart and the patient was thought appropriate for discharge.  Please note that the patient does have a history of Cipro sensitivity, however, has tolerated Cipro during this hospitalization and will be discharged on Cipro after treatment of a Pseudomonas UTI with both Fortaz and Cipro.     Larina Earthly, M.D.     RA/MEDQ  D:  01/08/2011  T:  01/08/2011  Job:  [id number]  Electronically Signed by Larina Earthly M.D. on 02/01/2011 08:46:27 PM

## 2011-02-23 LAB — HEPATIC FUNCTION PANEL
ALT: 29
AST: 23
Albumin: 2.8 — ABNORMAL LOW
Bilirubin, Direct: <0.1

## 2011-02-23 LAB — DIFFERENTIAL
Basophils Absolute: 0
Basophils Relative: 0
Basophils Relative: 0
Eosinophils Absolute: 0
Eosinophils Relative: 0
Lymphocytes Relative: 15
Monocytes Absolute: 0.6
Monocytes Absolute: 0.7
Monocytes Relative: 7
Neutro Abs: 5.5
Neutro Abs: 6.7
Neutrophils Relative %: 75

## 2011-02-23 LAB — CBC
HCT: 41
Hemoglobin: 12.6 — ABNORMAL LOW
Hemoglobin: 14
MCHC: 34
MCHC: 34.2
MCV: 91
RBC: 4.04 — ABNORMAL LOW
RBC: 4.51
RDW: 13.8

## 2011-02-23 LAB — POCT CARDIAC MARKERS: Myoglobin, poc: 257

## 2011-02-23 LAB — CULTURE, BLOOD (ROUTINE X 2): Culture: NO GROWTH

## 2011-02-23 LAB — URINE MICROSCOPIC-ADD ON

## 2011-02-23 LAB — I-STAT 8, (EC8 V) (CONVERTED LAB)
BUN: 28 — ABNORMAL HIGH
Bicarbonate: 20.4
Hemoglobin: 14.3
Operator id: 288831
Sodium: 123 — ABNORMAL LOW

## 2011-02-23 LAB — URINE CULTURE
Colony Count: NO GROWTH
Culture: NO GROWTH

## 2011-02-23 LAB — URINALYSIS, ROUTINE W REFLEX MICROSCOPIC
Bilirubin Urine: NEGATIVE
Glucose, UA: NEGATIVE
Ketones, ur: NEGATIVE
Nitrite: NEGATIVE
pH: 6.5

## 2011-02-23 LAB — BASIC METABOLIC PANEL
CO2: 26
Calcium: 8.7
Creatinine, Ser: 1.11
GFR calc Af Amer: >60
GFR calc non Af Amer: >60
Glucose, Bld: 98
Sodium: 134 — ABNORMAL LOW

## 2011-02-23 LAB — B-NATRIURETIC PEPTIDE (CONVERTED LAB): Pro B Natriuretic peptide (BNP): <30

## 2011-03-24 ENCOUNTER — Encounter: Payer: Self-pay | Admitting: Cardiovascular Disease

## 2011-03-27 ENCOUNTER — Encounter: Payer: Self-pay | Admitting: Cardiovascular Disease

## 2011-03-27 ENCOUNTER — Ambulatory Visit (INDEPENDENT_AMBULATORY_CARE_PROVIDER_SITE_OTHER): Payer: Medicare Other | Admitting: Cardiovascular Disease

## 2011-03-27 DIAGNOSIS — I10 Essential (primary) hypertension: Secondary | ICD-10-CM

## 2011-03-27 DIAGNOSIS — I6529 Occlusion and stenosis of unspecified carotid artery: Secondary | ICD-10-CM

## 2011-03-27 DIAGNOSIS — I70219 Atherosclerosis of native arteries of extremities with intermittent claudication, unspecified extremity: Secondary | ICD-10-CM

## 2011-03-27 MED ORDER — AMLODIPINE BESYLATE 5 MG PO TABS: 5.0000 mg | ORAL_TABLET | Freq: Every day | ORAL | Status: DC

## 2011-03-27 NOTE — Assessment & Plan Note (Signed)
The patient has marked elevation of his blood pressure today. I repeated his blood pressure checked and confirmed the reading. He has a component of whitecoat hypertension, but his home blood pressure control is suboptimal. I've asked him to start back on amlodipine 5 mg daily. He has previously been on hydralazine and this may need to be restarted as well. He sees Dr. Evlyn Kanner back in followup in several weeks and he will likely need further medicine titration. We will continue to monitor his home blood pressures. He has a very wide pulse pressure and I suspect he will continue to be difficult to treat.

## 2011-03-27 NOTE — Patient Instructions (Signed)
Your physician has recommended you make the following change in your medication: START Amlodipine 5mg  take one by mouth daily  Your physician wants you to follow-up in: 6 MONTHS.  You will receive a reminder letter in the mail two months in advance. If you don't receive a letter, please call our office to schedule the follow-up appointment.

## 2011-03-27 NOTE — Assessment & Plan Note (Signed)
ABIs have been in the moderate range bilaterally. He will have repeat studies done later this month.

## 2011-03-27 NOTE — Progress Notes (Signed)
HPI:  [name] returns for followup evaluation. He is a 77 year old gentleman with peripheral arterial disease, malignant hypertension,and carotid stenosis. His leg pain has been stable. He's been able to do a good bit of walking and hasn't had much trouble. He does complain of bilateral lower leg numbness but this is previously extended up to the knees and at now is localized in the feet. He denies typical symptoms of calf claudication.  He denies chest pain, dyspnea, or palpitations.  He reports recent elevation in his home blood pressures, but not to the degree of the office reading today. His blood pressure this morning was 158/55. He has previously been on an aggressive multidrug program for treatment of his hypertension, but many of his medications were discontinued a few months ago when he developed acute renal failure following a cerebral angiogram. He reports that his blood pressure was well controlled for the first month following his hospital stay and now he has become more active he has noted that his pressure has been elevated. He denies headache, vision changes, or recurrent stroke symptoms.  Outpatient Encounter Prescriptions as of 03/27/2011  Medication Sig Dispense Refill  . aspirin 325 MG tablet Take 325 mg by mouth daily.        . cloNIDine (CATAPRES) 0.2 MG tablet Take 0.2 mg by mouth 2 (two) times daily.        . clopidogrel (PLAVIX) 75 MG tablet Take 75 mg by mouth daily.        . fenofibrate 160 MG tablet Take 160 mg by mouth daily.        . folic acid (FOLVITE) 1 MG tablet Take 2 mg by mouth daily.        . furosemide (LASIX) 40 MG tablet Take 40 mg by mouth daily.        [name] glipiZIDE (GLUCOTROL) 10 MG tablet Take 10 mg by mouth daily.        . insulin glargine (LANTUS) 100 UNIT/ML injection as directed.        . pregabalin (LYRICA) 75 MG capsule Take 75 mg by mouth at bedtime.       [name] amLODipine (NORVASC) 5 MG tablet Take 1 tablet (5 mg total) by mouth daily.  90 tablet  3  .  DISCONTD: amLODipine (NORVASC) 10 MG tablet Take 10 mg by mouth daily.        [name] DISCONTD: ezetimibe-simvastatin (VYTORIN) 10-40 MG per tablet Take 1 tablet by mouth at bedtime.        [name] DISCONTD: hydrALAZINE (APRESOLINE) 50 MG tablet 1 tab po tid      . DISCONTD: hydrochlorothiazide 25 MG tablet Take 1 tablet (25 mg total) by mouth daily.  90 tablet  3  . DISCONTD: metFORMIN (GLUCOPHAGE) 1000 MG tablet Take 1,000 mg by mouth 2 (two) times daily with a meal.        . DISCONTD: niacin (NIASPAN) 1000 MG CR tablet Take 1,000 mg by mouth 2 (two) times daily.        [name] DISCONTD: ramipril (ALTACE) 5 MG capsule Take 5 mg by mouth daily.        [name] DISCONTD: torsemide (DEMADEX) 20 MG tablet Take 2 tablets (40 mg total) by mouth daily.  60 tablet  11    Allergies  Allergen Reactions  . Aliskiren Fumarate     REACTION: Reaction not known  . Doxazosin Mesylate     Past Medical History  Diagnosis Date  . Carotid stenosis  u/s 5/11: R 0-39% L 40-59% (stable)  . HTN (hypertension)     refractory. unable to cardura in past due to hypotension  . Edema   . PAD (peripheral artery disease)   . Cerebrovascular disease, unspecified   . HLD (hyperlipidemia)   . Bradycardia   . Diabetes mellitus type II   . Hyperkalemia     ROS: Negative except as per HPI  BP 220/70  Pulse 50  Ht 5\' 11"  (1.803 m)  Wt 80.287 kg (177 lb)  BMI 24.69 kg/m2  PHYSICAL EXAM: Pt is alert and oriented, NAD HEENT: normal Neck: JVP - normal, carotids 2+= with bilateral bruits Lungs: CTA bilaterally CV: RRR without murmur or gallop Abd: soft, NT, Positive BS, no hepatomegaly Ext: no C/C/E Skin: warm/dry no rash  ASSESSMENT AND PLAN:

## 2011-03-27 NOTE — Assessment & Plan Note (Signed)
The patient does have moderate carotid stenosis. He is due for repeat carotid studies. He remains on aspirin and Plavix.

## 2011-04-03 ENCOUNTER — Other Ambulatory Visit: Payer: Self-pay | Admitting: Cardiology

## 2011-04-03 DIAGNOSIS — I739 Peripheral vascular disease, unspecified: Secondary | ICD-10-CM

## 2011-04-03 DIAGNOSIS — I6529 Occlusion and stenosis of unspecified carotid artery: Secondary | ICD-10-CM

## 2011-04-05 ENCOUNTER — Encounter: Payer: Medicare Other | Admitting: Cardiology

## 2011-04-05 ENCOUNTER — Encounter (INDEPENDENT_AMBULATORY_CARE_PROVIDER_SITE_OTHER): Payer: Medicare Other | Admitting: Cardiology

## 2011-04-05 DIAGNOSIS — I6529 Occlusion and stenosis of unspecified carotid artery: Secondary | ICD-10-CM

## 2011-04-05 DIAGNOSIS — I739 Peripheral vascular disease, unspecified: Secondary | ICD-10-CM

## 2011-05-16 ENCOUNTER — Ambulatory Visit (HOSPITAL_COMMUNITY)
Admission: RE | Admit: 2011-05-16 | Discharge: 2011-05-16 | Disposition: A | Discharge status: Home / Self Care | Payer: Medicare Other | Source: Ambulatory Visit | Attending: Internal Medicine | Admitting: Internal Medicine

## 2011-05-16 VITALS — BP 194/52 | HR 43 | Wt 175.2 lb

## 2011-05-16 DIAGNOSIS — R609 Edema, unspecified: Secondary | ICD-10-CM | POA: Insufficient documentation

## 2011-05-16 DIAGNOSIS — I10 Essential (primary) hypertension: Secondary | ICD-10-CM

## 2011-05-16 DIAGNOSIS — I498 Other specified cardiac arrhythmias: Secondary | ICD-10-CM | POA: Insufficient documentation

## 2011-05-16 MED ORDER — HYDRALAZINE HCL 25 MG PO TABS: 25.0000 mg | ORAL_TABLET | Freq: Three times a day (TID) | ORAL | Status: DC

## 2011-05-16 NOTE — Patient Instructions (Signed)
Take Hydralazine 25 mg three times a day  Follow up in 6 months

## 2011-05-26 NOTE — Assessment & Plan Note (Signed)
BP still elevated. Will restart hydralazine.

## 2011-05-26 NOTE — Assessment & Plan Note (Signed)
Doing well. Continue current regimen.  

## 2011-05-26 NOTE — Assessment & Plan Note (Signed)
Stable/chronic. No indication for pacemaker at this point.

## 2011-05-26 NOTE — Progress Notes (Signed)
Patient ID: [name], male   DOB: 15-Feb-1930, 78 y.o.   MRN: [id number]  HPI:  [name] is a 78 y.o. male with a history of severe hypertension, hyperlipidemia, diabetes c/b peripheral neuropathy, right bundle-branch block with chronic bradycardia as well as peripheral vascular disease, status post right carotid endarterectomy and bilateral common iliac stenting and chronic LE edema due to venous insufficiency. CT angiogram has been negative for renal artery stenosis. His therapy has been limited by his bradycardia as well as hyperkalemia and inability to tolerate several antihypertensives most recently cardura which caused symptomatic hypotension.  Lexiscan Myoview recently demonstrated normal perfusion.  We have been struggling to manage his edema. Recently switched to demadex. BNP recently was 69. At last visit HCTZ added.  Edema is much improved. He denies chest pain. He denies significant shortness of breath.  SBP well controlled at home and in Dr. Rinaldo Cloud office. No orthopnea or PND.   Last carotid u/s: R 0-39% L 60-79%  9-13 He was discharged from [facility] due to a CVA. Also had acute kidney failure after he received dye and required short term hemodialysis.   He is here for follow up. He recently started amlodipine 5 mg at last office visit with Dr Excell Seltzer. SBP at home 160-170s. Not currently exercising but he does play golf when weather is warm. Denies SOB/PND/Orthopnea/CP. Compliant with medications. Chronic lower extremity edema.    ROS: All systems negative except as listed in HPI, PMH and Problem List.  Past Medical History  Diagnosis Date  . Carotid stenosis     u/s 5/11: R 0-39% L 40-59% (stable)  . HTN (hypertension)     refractory. unable to cardura in past due to hypotension  . Edema   . PAD (peripheral artery disease)   . Cerebrovascular disease, unspecified   . HLD (hyperlipidemia)   . Bradycardia   . Diabetes mellitus type II   . Hyperkalemia     Current Outpatient  Prescriptions  Medication Sig Dispense Refill  . amLODipine (NORVASC) 5 MG tablet Take 1 tablet (5 mg total) by mouth daily.  90 tablet  3  . aspirin 325 MG tablet Take 325 mg by mouth daily.        . cloNIDine (CATAPRES) 0.2 MG tablet Take 0.2 mg by mouth 2 (two) times daily.        . clopidogrel (PLAVIX) 75 MG tablet Take 75 mg by mouth daily.        . fenofibrate 160 MG tablet Take 160 mg by mouth daily.        . folic acid (FOLVITE) 1 MG tablet Take 2 mg by mouth daily.        . furosemide (LASIX) 40 MG tablet Take 40 mg by mouth daily.        [name] glipiZIDE (GLUCOTROL) 10 MG tablet Take 10 mg by mouth daily.        . insulin glargine (LANTUS) 100 UNIT/ML injection as directed.        . pregabalin (LYRICA) 75 MG capsule Take 75 mg by mouth at bedtime.       . hydrALAZINE (APRESOLINE) 25 MG tablet Take 1 tablet (25 mg total) by mouth 3 (three) times daily.  90 tablet  6     PHYSICAL EXAM: Filed Vitals:   05/16/11 1129  BP: 194/52  Pulse: 43   General:  Well appearing. No resp difficulty HEENT: normal Neck: L scar. normal carotid upstrokes without bruits, JVP  5-6 Lungs: CTA  CV: RRR without murmur.  Abd: soft, NT. Moderately distended no bruit, no organomegaly Ext: no clubbing, cyanosis, tr-1+ edema.pedals not palpable. Skin: warm and dry without rash    ASSESSMENT & PLAN:

## 2011-09-13 ENCOUNTER — Other Ambulatory Visit: Payer: Self-pay | Admitting: Cardiology

## 2011-09-13 DIAGNOSIS — I6529 Occlusion and stenosis of unspecified carotid artery: Secondary | ICD-10-CM

## 2011-09-22 ENCOUNTER — Encounter (INDEPENDENT_AMBULATORY_CARE_PROVIDER_SITE_OTHER): Payer: Medicare Other

## 2011-09-22 DIAGNOSIS — I6529 Occlusion and stenosis of unspecified carotid artery: Secondary | ICD-10-CM

## 2011-09-26 ENCOUNTER — Encounter: Payer: Self-pay | Admitting: Cardiovascular Disease

## 2011-09-26 ENCOUNTER — Ambulatory Visit (INDEPENDENT_AMBULATORY_CARE_PROVIDER_SITE_OTHER): Payer: Medicare Other | Admitting: Cardiovascular Disease

## 2011-09-26 VITALS — BP 150/62 | HR 44 | Ht 71.0 in | Wt 183.4 lb

## 2011-09-26 DIAGNOSIS — I10 Essential (primary) hypertension: Secondary | ICD-10-CM

## 2011-09-26 DIAGNOSIS — I70219 Atherosclerosis of native arteries of extremities with intermittent claudication, unspecified extremity: Secondary | ICD-10-CM

## 2011-09-26 NOTE — Patient Instructions (Signed)
Your physician wants you to follow-up in: 1 YEAR with Dr Cooper.  You will receive a reminder letter in the mail two months in advance. If you don't receive a letter, please call our office to schedule the follow-up appointment.  Your physician recommends that you continue on your current medications as directed. Please refer to the Current Medication list given to you today.  

## 2011-10-02 ENCOUNTER — Encounter: Payer: Self-pay | Admitting: Cardiovascular Disease

## 2011-10-02 NOTE — Progress Notes (Signed)
HPI:  78 year old gentleman presenting for followup evaluation. He is followed for lower extremity peripheral arterial disease, malignant hypertension, and carotid stenosis. His cardiac problems are followed by Dr. Gala [name]. His blood pressure has been very difficult to control because of various problems with multiple medications. These include hyperkalemia and orthostasis.  [name] loves to play golf. He has been able to get out and play recently without difficulty related to his legs. He has a history of intermittent claudication as well as peripheral neuropathy, but both of these problems seem to be improved. He denies limiting symptoms at the present time. He denies chest pain or dyspnea. He's been compliant with his medication program.  Recent carotid Doppler showed 40-59% disease in the right internal carotid artery and 60-79% stenosis of the left internal carotid artery. Followup was recommended in 6 months. Last ABIs were done in November 2012 and they were 56% on the right and 50% on the left.  Outpatient Encounter Prescriptions as of 09/26/2011  Medication Sig Dispense Refill  . amLODipine (NORVASC) 5 MG tablet Take 1 tablet (5 mg total) by mouth daily.  90 tablet  3  . aspirin 325 MG tablet Take 325 mg by mouth daily.        . cloNIDine (CATAPRES) 0.2 MG tablet Take 0.2 mg by mouth 2 (two) times daily.        . clopidogrel (PLAVIX) 75 MG tablet Take 75 mg by mouth daily.        . fenofibrate 160 MG tablet Take 160 mg by mouth daily.        . folic acid (FOLVITE) 1 MG tablet Take 2 mg by mouth daily.        . furosemide (LASIX) 40 MG tablet Take 40 mg by mouth daily.        . hydrALAZINE (APRESOLINE) 25 MG tablet Take 1 tablet (25 mg total) by mouth 3 (three) times daily.  90 tablet  6  . Insulin Lispro Prot & Lispro (HUMALOG MIX 50/50 PEN [place]) Inject into the skin 2 (two) times daily.      . pregabalin (LYRICA) 75 MG capsule Take 75 mg by mouth at bedtime.       [name] DISCONTD:  glipiZIDE (GLUCOTROL) 10 MG tablet Take 10 mg by mouth daily.        [name] DISCONTD: insulin glargine (LANTUS) 100 UNIT/ML injection as directed.          Allergies  Allergen Reactions  . Aliskiren Fumarate     REACTION: Reaction not known  . Doxazosin Mesylate     Past Medical History  Diagnosis Date  . Carotid stenosis     u/s 5/11: R 0-39% L 40-59% (stable)  . HTN (hypertension)     refractory. unable to cardura in past due to hypotension  . Edema   . PAD (peripheral artery disease)   . Cerebrovascular disease, unspecified   . HLD (hyperlipidemia)   . Bradycardia   . Diabetes mellitus type II   . Hyperkalemia     ROS: Negative except as per HPI  BP 150/62  Pulse 44  Ht 5\' 11"  (1.803 m)  Wt 83.19 kg (183 lb 6.4 oz)  BMI 25.58 kg/m2  PHYSICAL EXAM: Pt is alert and oriented, NAD HEENT: normal Neck: JVP - normal, carotids 2+= with bilateral bruits Lungs: CTA bilaterally CV: Bradycardic and regular with grade 2/6 systolic murmur at the left sternal border Abd: soft, NT, Positive BS, no hepatomegaly Ext: no C/C/E,  femoral pulses 2+, pedal pulses diminished. Skin: warm/dry no rash  EKG:  Marked sinus bradycardia with first-degree AV block and right bundle branch block, heart rate 44 beats per minute  ASSESSMENT AND PLAN: 1. Lower extremity peripheral arterial disease with intermittent claudication. Minimal symptoms at present. ABIs are moderately reduced bilaterally. The patient has undergone iliac stenting in the past and we will continue to follow him medically at this point.  2. Carotid stenosis without history of stroke. The patient has moderate carotid disease by recent carotid duplex. He is on appropriate medical therapy and will continue with the same.  3. Malignant hypertension. Reports home blood pressure readings have been very good running in the range of 115 to 120/40-50. Continue same meds.

## 2011-12-11 ENCOUNTER — Telehealth (HOSPITAL_COMMUNITY): Payer: Self-pay | Admitting: Internal Medicine

## 2011-12-11 MED ORDER — HYDRALAZINE HCL 25 MG PO TABS: 25.0000 mg | ORAL_TABLET | Freq: Three times a day (TID) | ORAL | Status: DC

## 2011-12-11 NOTE — Telephone Encounter (Signed)
Pt needs hydralazine to be refilled. [facility] mail is who he go through. Pt states he only has two day supply. Please refill medication.  Thanks

## 2011-12-11 NOTE — Telephone Encounter (Signed)
Pt aware prescription sent in 

## 2011-12-13 ENCOUNTER — Encounter (HOSPITAL_COMMUNITY): Payer: Medicare Other

## 2012-01-01 ENCOUNTER — Encounter (HOSPITAL_COMMUNITY): Payer: Self-pay

## 2012-01-01 ENCOUNTER — Ambulatory Visit (HOSPITAL_COMMUNITY)
Admission: RE | Admit: 2012-01-01 | Discharge: 2012-01-01 | Disposition: A | Discharge status: Home / Self Care | Payer: Medicare Other | Source: Ambulatory Visit | Attending: Internal Medicine | Admitting: Internal Medicine

## 2012-01-01 VITALS — BP 138/70 | HR 57 | Resp 18 | Ht 71.0 in | Wt 190.1 lb

## 2012-01-01 DIAGNOSIS — I70219 Atherosclerosis of native arteries of extremities with intermittent claudication, unspecified extremity: Secondary | ICD-10-CM

## 2012-01-01 DIAGNOSIS — I2510 Atherosclerotic heart disease of native coronary artery without angina pectoris: Secondary | ICD-10-CM | POA: Insufficient documentation

## 2012-01-01 DIAGNOSIS — I10 Essential (primary) hypertension: Secondary | ICD-10-CM

## 2012-01-01 DIAGNOSIS — R609 Edema, unspecified: Secondary | ICD-10-CM

## 2012-01-01 NOTE — Assessment & Plan Note (Addendum)
Stable, keep SBP <140.  Continue current medications.

## 2012-01-01 NOTE — Assessment & Plan Note (Addendum)
Stable, follow up with Dr. Excell Seltzer as previously scheduled.

## 2012-01-01 NOTE — Patient Instructions (Addendum)
We will contact you in 6 months to schedule your next appointment.  

## 2012-01-06 NOTE — Assessment & Plan Note (Signed)
Much improved. Continue current regimen. Reinforced need for daily weights and reviewed use of sliding scale diuretics.

## 2012-01-06 NOTE — Progress Notes (Signed)
PCP: Dr. Evlyn Kanner.  HPI:  [name] is a 78 y.o. male with a history of severe hypertension, hyperlipidemia, diabetes c/b peripheral neuropathy, right bundle-branch block with chronic bradycardia as well as peripheral vascular disease, status post right carotid endarterectomy and bilateral common iliac stenting and chronic LE edema due to venous insufficiency. CT angiogram has been negative for renal artery stenosis. His therapy has been limited by his bradycardia as well as hyperkalemia and inability to tolerate several antihypertensives most recently cardura which caused symptomatic hypotension.  04/2010: Lexiscan Myoview demonstrated normal perfusion.  We have been struggling to manage his edema. Recently switched to demadex. BNP recently was 69. At last visit HCTZ added.  Last carotid u/s: R 0-39% L 60-79%  9-13 He was discharged from [facility] due to a CVA. Also had acute kidney failure after he received dye and required short term hemodialysis.   He is here for 6 month follow up.  He feels well.  He denies edema (occ ankle edema), orthopnea/PND.  Playing golf when he can, walks and rides the course.  SBP at home runs 140-150.  No chest pain.      ROS: All systems negative except as listed in HPI, PMH and Problem List.  Past Medical History  Diagnosis Date  . Carotid stenosis     u/s 5/11: R 0-39% L 40-59% (stable)  . HTN (hypertension)     refractory. unable to cardura in past due to hypotension  . Edema   . PAD (peripheral artery disease)   . Cerebrovascular disease, unspecified   . HLD (hyperlipidemia)   . Bradycardia   . Diabetes mellitus type II   . Hyperkalemia     Current Outpatient Prescriptions  Medication Sig Dispense Refill  . amLODipine (NORVASC) 5 MG tablet Take 1 tablet (5 mg total) by mouth daily.  90 tablet  3  . aspirin 325 MG tablet Take 325 mg by mouth daily.        . cloNIDine (CATAPRES) 0.2 MG tablet Take 0.2 mg by mouth 2 (two) times daily.        . clopidogrel  (PLAVIX) 75 MG tablet Take 75 mg by mouth daily.        . fenofibrate 160 MG tablet Take 160 mg by mouth daily.        . folic acid (FOLVITE) 1 MG tablet Take 2 mg by mouth daily.        . furosemide (LASIX) 40 MG tablet Take 40 mg by mouth daily.        . hydrALAZINE (APRESOLINE) 25 MG tablet Take 1 tablet (25 mg total) by mouth 3 (three) times daily.  270 tablet  3  . Insulin Lispro Prot & Lispro (HUMALOG MIX 50/50 PEN [place]) Inject into the skin 2 (two) times daily.      . pregabalin (LYRICA) 75 MG capsule Take 75 mg by mouth 2 (two) times daily.          PHYSICAL EXAM: Filed Vitals:   01/01/12 1347  BP: 138/70  Pulse: 57  Resp: 18  Height: 5\' 11"  (1.803 m)  Weight: 190 lb 1.9 oz (86.238 kg)  SpO2: 98%   General:  Well appearing. No resp difficulty HEENT: normal Neck: R scar. normal carotid upstrokes without bruits, JVP  5-6 Lungs: CTA CV: RRR without murmur.  Abd: soft, NT. Moderately distended no bruit, no organomegaly Ext: no clubbing, cyanosis, tr edema.  pedals not palpable. Skin: warm and dry without rash    ASSESSMENT &  PLAN:

## 2012-04-02 ENCOUNTER — Other Ambulatory Visit: Payer: Self-pay | Admitting: Cardiology

## 2012-04-02 DIAGNOSIS — I739 Peripheral vascular disease, unspecified: Secondary | ICD-10-CM

## 2012-04-09 ENCOUNTER — Encounter (INDEPENDENT_AMBULATORY_CARE_PROVIDER_SITE_OTHER): Payer: Medicare Other

## 2012-04-09 DIAGNOSIS — I739 Peripheral vascular disease, unspecified: Secondary | ICD-10-CM

## 2012-04-10 ENCOUNTER — Other Ambulatory Visit: Payer: Self-pay | Admitting: Cardiology

## 2012-04-10 DIAGNOSIS — I6529 Occlusion and stenosis of unspecified carotid artery: Secondary | ICD-10-CM

## 2012-04-11 ENCOUNTER — Encounter (INDEPENDENT_AMBULATORY_CARE_PROVIDER_SITE_OTHER): Payer: Medicare Other

## 2012-04-11 DIAGNOSIS — I6529 Occlusion and stenosis of unspecified carotid artery: Secondary | ICD-10-CM

## 2012-04-17 ENCOUNTER — Other Ambulatory Visit: Payer: Self-pay | Admitting: Cardiology

## 2012-04-17 DIAGNOSIS — I714 Abdominal aortic aneurysm, without rupture: Secondary | ICD-10-CM

## 2012-04-22 ENCOUNTER — Encounter (INDEPENDENT_AMBULATORY_CARE_PROVIDER_SITE_OTHER): Payer: Medicare Other

## 2012-04-22 DIAGNOSIS — I739 Peripheral vascular disease, unspecified: Secondary | ICD-10-CM

## 2012-04-22 DIAGNOSIS — I714 Abdominal aortic aneurysm, without rupture: Secondary | ICD-10-CM

## 2012-05-05 ENCOUNTER — Encounter (HOSPITAL_COMMUNITY): Payer: Self-pay | Admitting: *Deleted

## 2012-05-05 ENCOUNTER — Emergency Department (HOSPITAL_COMMUNITY): Payer: Medicare Other

## 2012-05-05 ENCOUNTER — Emergency Department (HOSPITAL_COMMUNITY)
Admission: EM | Admit: 2012-05-05 | Discharge: 2012-05-06 | Disposition: A | Discharge status: Home / Self Care | Payer: Medicare Other | Attending: Emergency Medicine | Admitting: Emergency Medicine

## 2012-05-05 DIAGNOSIS — I2510 Atherosclerotic heart disease of native coronary artery without angina pectoris: Secondary | ICD-10-CM | POA: Insufficient documentation

## 2012-05-05 DIAGNOSIS — Z8679 Personal history of other diseases of the circulatory system: Secondary | ICD-10-CM | POA: Insufficient documentation

## 2012-05-05 DIAGNOSIS — Z8673 Personal history of transient ischemic attack (TIA), and cerebral infarction without residual deficits: Secondary | ICD-10-CM | POA: Insufficient documentation

## 2012-05-05 DIAGNOSIS — I10 Essential (primary) hypertension: Secondary | ICD-10-CM | POA: Insufficient documentation

## 2012-05-05 DIAGNOSIS — R0609 Other forms of dyspnea: Secondary | ICD-10-CM | POA: Insufficient documentation

## 2012-05-05 DIAGNOSIS — R0989 Other specified symptoms and signs involving the circulatory and respiratory systems: Secondary | ICD-10-CM | POA: Insufficient documentation

## 2012-05-05 DIAGNOSIS — Z87891 Personal history of nicotine dependence: Secondary | ICD-10-CM | POA: Insufficient documentation

## 2012-05-05 DIAGNOSIS — Z79899 Other long term (current) drug therapy: Secondary | ICD-10-CM | POA: Insufficient documentation

## 2012-05-05 DIAGNOSIS — R0600 Dyspnea, unspecified: Secondary | ICD-10-CM

## 2012-05-05 DIAGNOSIS — E785 Hyperlipidemia, unspecified: Secondary | ICD-10-CM | POA: Insufficient documentation

## 2012-05-05 DIAGNOSIS — E119 Type 2 diabetes mellitus without complications: Secondary | ICD-10-CM | POA: Insufficient documentation

## 2012-05-05 DIAGNOSIS — Z794 Long term (current) use of insulin: Secondary | ICD-10-CM | POA: Insufficient documentation

## 2012-05-05 DIAGNOSIS — I739 Peripheral vascular disease, unspecified: Secondary | ICD-10-CM | POA: Insufficient documentation

## 2012-05-05 DIAGNOSIS — Z7982 Long term (current) use of aspirin: Secondary | ICD-10-CM | POA: Insufficient documentation

## 2012-05-05 DIAGNOSIS — J3489 Other specified disorders of nose and nasal sinuses: Secondary | ICD-10-CM | POA: Insufficient documentation

## 2012-05-05 LAB — COMPREHENSIVE METABOLIC PANEL
Albumin: 3.9 g/dL (ref 3.5–5.2)
Alkaline Phosphatase: 36 U/L — ABNORMAL LOW (ref 39–117)
BUN: 35 mg/dL — ABNORMAL HIGH (ref 6–23)
Creatinine, Ser: 1.88 mg/dL — ABNORMAL HIGH (ref 0.50–1.35)
GFR calc Af Amer: 37 mL/min — ABNORMAL LOW (ref >90)
Glucose, Bld: 181 mg/dL — ABNORMAL HIGH (ref 70–99)
Potassium: 4.7 mEq/L (ref 3.5–5.1)
Total Protein: 7 g/dL (ref 6.0–8.3)

## 2012-05-05 LAB — CBC WITH DIFFERENTIAL/PLATELET
Basophils Relative: 0 % (ref 0–1)
Eosinophils Absolute: 0 10*3/uL (ref 0.0–0.7)
Eosinophils Relative: 1 % (ref 0–5)
HCT: 37.3 % — ABNORMAL LOW (ref 39.0–52.0)
Hemoglobin: 12.3 g/dL — ABNORMAL LOW (ref 13.0–17.0)
Lymphs Abs: 0.3 10*3/uL — ABNORMAL LOW (ref 0.7–4.0)
MCH: 30.1 pg (ref 26.0–34.0)
MCHC: 33 g/dL (ref 30.0–36.0)
MCV: 91.4 fL (ref 78.0–100.0)
Monocytes Absolute: 0.2 10*3/uL (ref 0.1–1.0)
Monocytes Relative: 5 % (ref 3–12)
RBC: 4.08 MIL/uL — ABNORMAL LOW (ref 4.22–5.81)

## 2012-05-05 LAB — TROPONIN I: Troponin I: <0.3 ng/mL (ref <0.30)

## 2012-05-05 MED ORDER — IPRATROPIUM BROMIDE 0.02 % IN SOLN
0.5000 mg | Freq: Once | RESPIRATORY_TRACT | Status: AC
  Administered 2012-05-05: 0.5 mg via RESPIRATORY_TRACT
  Filled 2012-05-05: qty 2.5

## 2012-05-05 MED ORDER — ALBUTEROL SULFATE (5 MG/ML) 0.5% IN NEBU
5.0000 mg | INHALATION_SOLUTION | Freq: Once | RESPIRATORY_TRACT | Status: AC
  Administered 2012-05-05: 5 mg via RESPIRATORY_TRACT
  Filled 2012-05-05: qty 1

## 2012-05-05 NOTE — ED Notes (Signed)
Pt in xray

## 2012-05-05 NOTE — ED Notes (Signed)
The pt is c/o sob for the past 2 days he also has a headache and a low grade temp earlier today.  He had a recent sinus infection.  None-productive  cough

## 2012-05-05 NOTE — ED Notes (Signed)
Pt. Ambulated down hall and back, pt o2 96% entire time. Pt. Denied dizziness.

## 2012-05-05 NOTE — ED Notes (Signed)
Pt has audible inspiratory wheezing.

## 2012-05-05 NOTE — ED Notes (Signed)
Pt. Reports SOB and nausea x2 days. States recently finished medicine for sinus infection. Hx of stents and stroke. Pt. Alert and oriented x4. Pt. Lung sounds clear bilaterally, radial pulses strong. Placed on 2L [place] for pt comfort. Reports SOB worse with lying flat or exertion. Denies hx of CHF, MI.

## 2012-05-05 NOTE — ED Notes (Signed)
Pt. To CT

## 2012-05-06 LAB — TROPONIN I: Troponin I: <0.3 ng/mL (ref <0.30)

## 2012-05-06 MED ORDER — ALBUTEROL SULFATE HFA 108 (90 BASE) MCG/ACT IN AERS: 2.0000 | INHALATION_SPRAY | RESPIRATORY_TRACT | Status: DC | PRN

## 2012-05-06 MED ORDER — GUAIFENESIN 100 MG/5ML PO LIQD: 100.0000 mg | ORAL | Status: DC | PRN

## 2012-05-06 NOTE — ED Provider Notes (Signed)
History     CSN: [id number]  Arrival date & time 05/05/12  2115   First [name] Initiated Contact with Patient 05/05/12 2156      Chief Complaint  Patient presents with  . Shortness of Breath    (Consider location/radiation/quality/duration/timing/severity/associated sxs/prior treatment) HPI Comments: [name] presents ambulatory with his granddaughter for evaluation.  He reports feeling short of breath and tired.  He states he had a throbbing, frontal headache but after taking 2 aspirin, it has resolved.  He has clear nasal discharge and post-nasal drip.  He completed a 10 day course of an antibiotic for treatment of sinusitis 2 days ago.  He denies fever, neck pain, sore throat, Cp, palpitations, and NVD.  He also denies any rash or myalgias and states he did have a flu shot this year.  Although he has a history of vascular disease and CAD, he denies any history of CHF and has had no recent weight gain or lower extremity swelling.  The history is provided by the patient. No language interpreter was used.    Past Medical History  Diagnosis Date  . Carotid stenosis     u/s 5/11: R 0-39% L 40-59% (stable)  . HTN (hypertension)     refractory. unable to cardura in past due to hypotension  . Edema   . PAD (peripheral artery disease)   . Cerebrovascular disease, unspecified   . HLD (hyperlipidemia)   . Bradycardia   . Diabetes mellitus type II   . Hyperkalemia     Past Surgical History  Procedure Date  . Carotid endarterectomy 1/01    right    Family History  Problem Relation Age of Onset  . Coronary artery disease Father     History  Substance Use Topics  . Smoking status: Former Smoker    Quit date: 05/08/1994  . Smokeless tobacco: Not on file  . Alcohol Use: No      Review of Systems  Constitutional: Positive for chills and fatigue. Negative for fever, diaphoresis, activity change and appetite change.  HENT: Positive for congestion, rhinorrhea, sneezing and  postnasal drip. Negative for hearing loss, ear pain, nosebleeds, sore throat, facial swelling, drooling, trouble swallowing, neck pain, neck stiffness and sinus pressure.   Eyes: Negative for photophobia, pain and visual disturbance.  Respiratory: Positive for shortness of breath. Negative for apnea, cough, choking, chest tightness and wheezing.   Cardiovascular: Negative for chest pain, palpitations and leg swelling.  Gastrointestinal: Negative for nausea, vomiting, abdominal pain, diarrhea and constipation.  Genitourinary: Negative.   Musculoskeletal: Positive for arthralgias. Negative for myalgias, back pain and joint swelling.  Skin: Negative for rash and wound.  Neurological: Positive for headaches (resolved after aspirin). Negative for dizziness, tremors, seizures, syncope and light-headedness.  Hematological: Negative for adenopathy.  Psychiatric/Behavioral: Negative for confusion.    Allergies  Aliskiren fumarate and Doxazosin mesylate  Home Medications   Current Outpatient Rx  Name  Route  Sig  Dispense  Refill  . ASPIRIN 325 MG PO TABS   Oral   Take 325 mg by mouth daily.           [name] CLONIDINE HCL 0.2 MG PO TABS   Oral   Take 0.2 mg by mouth 2 (two) times daily.           [name] CLOPIDOGREL BISULFATE 75 MG PO TABS   Oral   Take 75 mg by mouth daily.           . FENOFIBRATE 160  MG PO TABS   Oral   Take 160 mg by mouth daily.           [name] FOLIC ACID 1 MG PO TABS   Oral   Take 2 mg by mouth daily.           . FUROSEMIDE 40 MG PO TABS   Oral   Take 40 mg by mouth daily.           [name] HYDRALAZINE HCL 25 MG PO TABS   Oral   Take 1 tablet (25 mg total) by mouth 3 (three) times daily.   270 tablet   3     Please send quickly, pt almost out of med   . HUMALOG MIX 50/50 PEN [place]   Subcutaneous   Inject 35-40 Units into the skin 2 (two) times daily. 40 units in the am 35 units in the pm         . PREGABALIN 75 MG PO CAPS   Oral   Take 75 mg by mouth 2  (two) times daily.            BP 169/55  Pulse 95  Temp 98.4 F (36.9 C) (Oral)  Resp 26  SpO2 96%  Physical Exam  Nursing note and vitals reviewed. Constitutional: He is oriented to person, place, and time. He appears well-developed and well-nourished. No distress.  HENT:  Head: Normocephalic and atraumatic.  Right Ear: External ear normal.  Left Ear: External ear normal.  Mouth/Throat: Oropharynx is clear and moist. No oropharyngeal exudate.       Mild, clear rhinorrhea,  No sinus tenderness  Eyes: Conjunctivae normal and EOM are normal. Pupils are equal, round, and reactive to light. Right eye exhibits no discharge. Left eye exhibits no discharge. No scleral icterus.  Neck: Normal range of motion. Neck supple. No JVD present. No tracheal deviation present.  Cardiovascular: Normal rate, regular rhythm, normal heart sounds and intact distal pulses.  Exam reveals no gallop.   No murmur heard. Pulmonary/Chest: Effort normal. No stridor. No respiratory distress. He has no wheezes. He has no rales. He exhibits no tenderness.       Breath sounds diffusely diminished but symmetric.    Abdominal: Soft. Bowel sounds are normal. He exhibits no distension and no mass. There is no tenderness. There is no rebound and no guarding.  Musculoskeletal: Normal range of motion. He exhibits no edema and no tenderness.  Lymphadenopathy:    He has no cervical adenopathy.  Neurological: He is alert and oriented to person, place, and time. No cranial nerve deficit. He exhibits normal muscle tone.  Skin: Skin is warm and dry. No rash noted. He is not diaphoretic. No erythema. No pallor.  Psychiatric: He has a normal mood and affect. His behavior is normal.    ED Course  Procedures (including critical care time)  Labs Reviewed  CBC WITH DIFFERENTIAL - Abnormal; Notable for the following:    RBC 4.08 (*)     Hemoglobin 12.3 (*)     HCT 37.3 (*)     Platelets 120 (*)     Neutrophils Relative 88  (*)     Lymphocytes Relative 6 (*)     Lymphs Abs 0.3 (*)     All other components within normal limits  COMPREHENSIVE METABOLIC PANEL - Abnormal; Notable for the following:    Glucose, Bld 181 (*)     BUN 35 (*)     Creatinine, Ser 1.88 (*)  Alkaline Phosphatase 36 (*)     GFR calc non Af Amer 32 (*)     GFR calc Af Amer 37 (*)     All other components within normal limits  TROPONIN I  TROPONIN I   Dg Chest 2 View  05/05/2012  *RADIOLOGY REPORT*  Clinical Data: Shortness of breath.  CHEST - 2 VIEW  Comparison: 01/03/2011  Findings: Mild bibasilar atelectasis or scarring is noted.  Low lung volumes are seen.  No evidence of pulmonary consolidation or edema.  Heart size is within normal limits.  IMPRESSION: Low lung volumes with mild bibasilar atelectasis versus scarring.   Original Report Authenticated By: Myles Rosenthal, M.D.    Ct Maxillofacial Wo Cm  05/06/2012  *RADIOLOGY REPORT*  Clinical Data: Headache and rhinorrhea; recently treated for sinusitis.  Nonproductive cough.  Fever.  CT MAXILLOFACIAL WITHOUT CONTRAST  Technique:  Multidetector CT imaging of the maxillofacial structures was performed. Multiplanar CT image reconstructions were also generated.  Comparison: CT of the head performed 12/22/2010, and MRI/MRA of the brain performed 12/23/2010  Findings: There is no evidence of fracture or dislocation.  The maxilla and mandible appear intact.  The nasal bone is unremarkable in appearance.  The visualized dentition demonstrates no acute abnormality.  Scattered dental hardware is noted.  The orbits are intact bilaterally.  Mild mucosal thickening is noted within the left maxillary sinus; the remaining visualized paranasal sinuses and mastoid air cells are well-aerated.  Dense calcification is noted at the carotid bifurcations bilaterally, with likely severe left-sided and mild to moderate right-sided stenosis.  The parapharyngeal fat planes are preserved. The nasopharynx, oropharynx and  hypopharynx are unremarkable in appearance.  The visualized portions of the valleculae and piriform sinuses are grossly unremarkable.  The parotid and submandibular glands are within normal limits.  No cervical lymphadenopathy is seen.  Prominence of the ventricles and sulci reflects mild cortical volume loss.  Cerebellar atrophy is noted.  IMPRESSION:  1.  Mild mucosal thickening within the left maxillary sinus; the remaining visualized paranasal sinuses and mastoid air cells are well-aerated. 2.  Dense calcification at the carotid bifurcations bilaterally, with likely severe left-sided and mild to moderate right-sided stenosis.  Carotid ultrasound would be helpful for further evaluation, when and as deemed clinically appropriate. 3.  Mild cortical volume loss noted.   Original Report Authenticated By: Tonia Ghent, M.D.      No diagnosis found.   Date: 05/06/2012  Rate: 79 bpm  Rhythm: sinus  QRS Axis: left  Intervals:    ST/T Wave abnormalities: nonspecific ST changes  Conduction Disutrbances:right bundle branch block, left ant fasc block  Narrative Interpretation:   Old EKG Reviewed: unchanged      MDM  Pt presents for evaluation of shortness of breath and rhinorrhea.  He appears nontoxic, afebrile, note mildly elevated BP, NAD.  He currently has no noted respiratory insufficiency.  Pt has no clinical evidence of sinusitis and he reports just completing a 10 day course of antibiotics secondary to sinus infection.  Will obtain basic labs, CXR, CT of sinuses, and orthostatic VS.  Nursing noted some wheezing on their initial evaluation however he has no wheezing on exam currently.  0050.  Pt stable, NAD.  Sleep, easily aroused.  Note nl O2 sat on room air.  He denies CP or shortness of breath.  His O2 sat remained stable while ambulating.  CT of sinuses demonstrates some mild mucosal thickening of the left maxillary sinus but no evidence of pansinusitis (carotid  artery disease noted also).   He has some chronic renal insufficiency but the metabolic panel is otherwise unremarkable.  He also has no leukocytosis and his CXR is negative for an infiltrate, edema, or central vascular congestion.  1st trop is negative.  Will repeat the trop, if negative, will discharge home to follow-up closely with his PMD.       [name] Chad, [name] 05/06/12 [phone]

## 2012-05-06 NOTE — ED Provider Notes (Signed)
I assumed care at signout to f/u on repeat troponin Ekg/labs reviewed Pt improved, he is resting comfortably, his lung sounds are clear and no hypoxia on RA (pulse ox 94-95% on my eval) He feels comfortable for d/c home Doubt ACS given history.  Clinically does not appear to have PE  Joya Gaskins, MD 05/06/12 [phone]

## 2012-07-04 ENCOUNTER — Ambulatory Visit (HOSPITAL_COMMUNITY)
Admission: RE | Admit: 2012-07-04 | Discharge: 2012-07-04 | Disposition: A | Discharge status: Home / Self Care | Payer: Medicare Other | Source: Ambulatory Visit | Attending: Internal Medicine | Admitting: Internal Medicine

## 2012-07-04 ENCOUNTER — Encounter (HOSPITAL_COMMUNITY): Payer: Self-pay

## 2012-07-04 VITALS — BP 200/78 | HR 68 | Wt 193.8 lb

## 2012-07-04 DIAGNOSIS — I10 Essential (primary) hypertension: Secondary | ICD-10-CM | POA: Insufficient documentation

## 2012-07-04 MED ORDER — CLONIDINE HCL 0.3 MG PO TABS: 0.3000 mg | ORAL_TABLET | Freq: Two times a day (BID) | ORAL | Status: DC

## 2012-07-04 NOTE — Addendum Note (Signed)
Encounter addended by: Noralee Space, RN on: 07/04/2012  3:10 PM<BR>     Documentation filed: Patient Instructions Section, Orders

## 2012-07-04 NOTE — Assessment & Plan Note (Signed)
BP trending up. Will increase clonidine to 0.3 bid and continue to follow closely. He will monitor his BP at home for [place] with goal SBP 130-145. Call in 2 weeks if BP is not coming down to goal.  Appreciate Dr. Danella Sensing assistance in following his vascular disease.

## 2012-07-04 NOTE — Patient Instructions (Addendum)
Increase Carvedilol 0.3 mg Twice daily   We will contact you in 4 months to schedule your next appointment.

## 2012-07-04 NOTE — Progress Notes (Signed)
PCP: Dr. Evlyn Kanner.  HPI:  [name] is a 79 y.o. male with a history of  1) severe hypertension    --CT angiogram has been negative for renal artery stenosis.    --Rx limited by bradycardia , hyperkalemia and inability to tolerate several antihypertensives most recently cardura which caused symptomatic hypotension. 2) hyperlipidemia 3) diabetes c/b peripheral neuropathy 4) right bundle-branch block with chronic bradycardia  5) peripheral vascular disease 6) status post right carotid endarterectomy and bilateral common iliac stenting        -Last carotid u/s: R 0-39% L 60-79% 7) chronic LE edema due to venous insufficiency.   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    He is here for follow up.  Doing fairly well. Playing golf 1x/week. No claudication (following with Dr. Excell Seltzer). Recently had ABIs, ab u/s and carotid u/s with Dr. Excell Seltzer. Denies edema. SBP at home running high 150-170.  No chest pain.     ROS: All systems negative except as listed in HPI, PMH and Problem List.  Past Medical History  Diagnosis Date  . Carotid stenosis     u/s 5/11: R 0-39% L 40-59% (stable)  . HTN (hypertension)     refractory. unable to cardura in past due to hypotension  . Edema   . PAD (peripheral artery disease)   . Cerebrovascular disease, unspecified   . HLD (hyperlipidemia)   . Bradycardia   . Diabetes mellitus type II   . Hyperkalemia       PHYSICAL EXAM: Filed Vitals:   07/04/12 1429  BP: 200/78  Pulse: 68  Weight: 193 lb 12.8 oz (87.907 kg)  SpO2: 96%   General:  Well appearing. No resp difficulty HEENT: normal Neck: R scar. normal carotid upstrokes without bruits, JVP  5-6 Lungs: CTA CV: RRR +s4. 2/6 SEM LSB Abd: soft, NT. Moderately distended no bruit, no organomegaly Ext: no clubbing, cyanosis, tr  -1+ firm edema.  pedals not palpable. Skin: warm and dry without rash    ASSESSMENT & PLAN:

## 2012-08-07 ENCOUNTER — Encounter: Payer: Self-pay | Admitting: Cardiovascular Disease

## 2012-09-10 ENCOUNTER — Encounter (INDEPENDENT_AMBULATORY_CARE_PROVIDER_SITE_OTHER): Payer: Medicare Other | Admitting: Ophthalmology

## 2012-09-10 DIAGNOSIS — H353290 Exudative age-related macular degeneration, unspecified eye, stage unspecified: Secondary | ICD-10-CM

## 2012-09-10 DIAGNOSIS — I10 Essential (primary) hypertension: Secondary | ICD-10-CM

## 2012-09-10 DIAGNOSIS — H43819 Vitreous degeneration, unspecified eye: Secondary | ICD-10-CM

## 2012-09-10 DIAGNOSIS — E11319 Type 2 diabetes mellitus with unspecified diabetic retinopathy without macular edema: Secondary | ICD-10-CM

## 2012-09-10 DIAGNOSIS — H35039 Hypertensive retinopathy, unspecified eye: Secondary | ICD-10-CM

## 2012-09-10 DIAGNOSIS — E1139 Type 2 diabetes mellitus with other diabetic ophthalmic complication: Secondary | ICD-10-CM

## 2012-09-10 DIAGNOSIS — H2510 Age-related nuclear cataract, unspecified eye: Secondary | ICD-10-CM

## 2012-09-10 DIAGNOSIS — H3530 Unspecified macular degeneration: Secondary | ICD-10-CM

## 2012-09-25 ENCOUNTER — Ambulatory Visit (INDEPENDENT_AMBULATORY_CARE_PROVIDER_SITE_OTHER): Payer: Medicare Other | Admitting: Cardiovascular Disease

## 2012-09-25 ENCOUNTER — Encounter: Payer: Self-pay | Admitting: Cardiovascular Disease

## 2012-09-25 VITALS — BP 168/66 | HR 68 | Ht 71.0 in | Wt 192.0 lb

## 2012-09-25 DIAGNOSIS — I70219 Atherosclerosis of native arteries of extremities with intermittent claudication, unspecified extremity: Secondary | ICD-10-CM

## 2012-09-25 NOTE — Progress Notes (Signed)
HPI:  79 year old gentleman presenting for followup evaluation. The patient has lower extremity peripheral arterial disease and carotid stenosis. He's had malignant hypertension. Blood pressure control has been complicated by medication intolerances. He's had hyperkalemia and orthostasis with various medicines.  Overall he is doing well. His blood pressure continues to be his biggest problem. When he last saw Dr. Gala Romney is clonidine was increased to 0.3 mg twice daily. His systolic pressure today is 168, was 200 his last visit.   He's had no recent stroke or TIA symptoms. He is having vision worsening and this is apparently related to macular degeneration and cataracts. He is undergoing treatment with Dr. Ashley Royalty.  He's been playing a lot of golf. His legs are doing better. He denies calf or thigh pain with ambulation. He's had no ulcerations or sores on the feet. He denies chest pain or pressure.  Outpatient Encounter Prescriptions as of 09/25/2012  Medication Sig Dispense Refill  . amLODipine (NORVASC) 5 MG tablet Take 5 mg by mouth daily.      [name] aspirin 325 MG tablet Take 325 mg by mouth daily.        . cloNIDine (CATAPRES) 0.3 MG tablet Take 1 tablet (0.3 mg total) by mouth 2 (two) times daily.  180 tablet  3  . clopidogrel (PLAVIX) 75 MG tablet Take 75 mg by mouth daily.        . fenofibrate 160 MG tablet Take 160 mg by mouth daily.        . folic acid (FOLVITE) 1 MG tablet Take 2 mg by mouth daily.        . furosemide (LASIX) 40 MG tablet Take 40 mg by mouth daily.        [name] guaiFENesin (ROBITUSSIN) 100 MG/5ML liquid Take 5-10 mLs (100-200 mg total) by mouth every 4 (four) hours as needed for cough.  60 mL  0  . hydrALAZINE (APRESOLINE) 25 MG tablet Take 1 tablet (25 mg total) by mouth 3 (three) times daily.  270 tablet  3  . Insulin Lispro Prot & Lispro (HUMALOG MIX 50/50 PEN [place]) Inject 35-40 Units into the skin 2 (two) times daily. 40 units in the am 40 units in the pm      .  pregabalin (LYRICA) 75 MG capsule Take 75 mg by mouth 2 (two) times daily.       . [DISCONTINUED] albuterol (PROVENTIL HFA;VENTOLIN HFA) 108 (90 BASE) MCG/ACT inhaler Inhale 2 puffs into the lungs every 4 (four) hours as needed for wheezing or shortness of breath.  1 Inhaler  3   No facility-administered encounter medications on file as of 09/25/2012.    Allergies  Allergen Reactions  . Aliskiren Fumarate     REACTION: Reaction not known  . Doxazosin Mesylate     Past Medical History  Diagnosis Date  . Carotid stenosis     u/s 5/11: R 0-39% L 40-59% (stable)  . HTN (hypertension)     refractory. unable to cardura in past due to hypotension  . Edema   . PAD (peripheral artery disease)   . Cerebrovascular disease, unspecified   . HLD (hyperlipidemia)   . Bradycardia   . Diabetes mellitus type II   . Hyperkalemia     ROS: Negative except as per HPI  BP 168/66  Pulse 68  Ht 5\' 11"  (1.803 m)  Wt 87.091 kg (192 lb)  BMI 26.79 kg/m2  PHYSICAL EXAM: Pt is alert and oriented, NAD HEENT: normal Neck: JVP -  normal, carotids 2+= with bilateral bruits, right carotid endarterectomy scar Lungs: CTA bilaterally CV: RRR with grade 2/6 systolic murmur best heard at the right upper sternal border Abd: soft, NT, Positive BS, no hepatomegaly Ext: no C/C/E, pedal pulses nonpalpable Skin: warm/dry no rash  ASSESSMENT AND PLAN: 1. Lower extremity peripheral arterial disease. The patient has bilateral iliac stenosis with history of stenting as well as SFA and popliteal disease. He has stable ABIs of 0.58 on the right and 0.56 on the left. He has not currently limited by claudication. He'll continue on his current medical program and followup in 1 year.  2. Carotid stenosis without history of stroke. He is due for a repeat carotid duplex in June. This is been scheduled. He has no stroke or TIA symptoms.  3. Malignant hypertension. Blood pressure is improved on his current medical program.  Will continue the same. He always has a wide pulse pressure and I suspect a major component of this is due to the medial calcification and noncompliant vessels.  For followup I will see him back in one year. He knows to call if any problems arise.  Tonny Bollman 09/25/2012 2:27 PM

## 2012-09-25 NOTE — Patient Instructions (Addendum)
Your physician wants you to follow-up in: 1 YEAR with Dr Excell Seltzer.  You will receive a reminder letter in the mail two months in advance. If you don't receive a letter, please call our office to schedule the follow-up appointment.  Your physician recommends that you continue on your current medications as directed. Please refer to the Current Medication list given to you today.  Your physician has requested that you have an abdominal aorta duplex in DECEMBER. During this test, an ultrasound is used to evaluate the aorta. Allow 30 minutes for this exam. Do not eat after midnight the day before and avoid carbonated beverages  Your physician has requested that you have a lower extremity arterial duplex in DECEMBER. This test is an ultrasound of the arteries in the legs. It looks at arterial blood flow in the legs. Allow one hour for Lower Arterial scans. There are no restrictions or special instructions

## 2012-10-04 ENCOUNTER — Ambulatory Visit: Payer: Medicare Other | Admitting: Cardiovascular Disease

## 2012-10-11 ENCOUNTER — Encounter (INDEPENDENT_AMBULATORY_CARE_PROVIDER_SITE_OTHER): Payer: Medicare Other | Admitting: Ophthalmology

## 2012-10-11 DIAGNOSIS — E1139 Type 2 diabetes mellitus with other diabetic ophthalmic complication: Secondary | ICD-10-CM

## 2012-10-11 DIAGNOSIS — E11319 Type 2 diabetes mellitus with unspecified diabetic retinopathy without macular edema: Secondary | ICD-10-CM

## 2012-10-11 DIAGNOSIS — I10 Essential (primary) hypertension: Secondary | ICD-10-CM

## 2012-10-11 DIAGNOSIS — H35039 Hypertensive retinopathy, unspecified eye: Secondary | ICD-10-CM

## 2012-10-11 DIAGNOSIS — H43819 Vitreous degeneration, unspecified eye: Secondary | ICD-10-CM

## 2012-10-11 DIAGNOSIS — H3530 Unspecified macular degeneration: Secondary | ICD-10-CM

## 2012-10-11 DIAGNOSIS — H2510 Age-related nuclear cataract, unspecified eye: Secondary | ICD-10-CM

## 2012-10-11 DIAGNOSIS — H353290 Exudative age-related macular degeneration, unspecified eye, stage unspecified: Secondary | ICD-10-CM

## 2012-10-14 ENCOUNTER — Encounter (INDEPENDENT_AMBULATORY_CARE_PROVIDER_SITE_OTHER): Payer: Medicare Other

## 2012-10-14 DIAGNOSIS — I6529 Occlusion and stenosis of unspecified carotid artery: Secondary | ICD-10-CM

## 2012-10-15 ENCOUNTER — Ambulatory Visit (HOSPITAL_COMMUNITY)
Admission: RE | Admit: 2012-10-15 | Discharge: 2012-10-15 | Disposition: A | Discharge status: Home / Self Care | Payer: Medicare Other | Source: Ambulatory Visit | Attending: Internal Medicine | Admitting: Internal Medicine

## 2012-10-15 ENCOUNTER — Encounter (HOSPITAL_COMMUNITY): Payer: Self-pay

## 2012-10-15 ENCOUNTER — Telehealth (HOSPITAL_COMMUNITY): Payer: Self-pay | Admitting: *Deleted

## 2012-10-15 VITALS — BP 160/50 | HR 61 | Wt 192.8 lb

## 2012-10-15 DIAGNOSIS — R0989 Other specified symptoms and signs involving the circulatory and respiratory systems: Secondary | ICD-10-CM | POA: Insufficient documentation

## 2012-10-15 DIAGNOSIS — I10 Essential (primary) hypertension: Secondary | ICD-10-CM | POA: Insufficient documentation

## 2012-10-15 DIAGNOSIS — R0609 Other forms of dyspnea: Secondary | ICD-10-CM | POA: Insufficient documentation

## 2012-10-15 MED ORDER — AMLODIPINE BESYLATE 5 MG PO TABS: 10.0000 mg | ORAL_TABLET | Freq: Every day | ORAL | Status: DC

## 2012-10-15 MED ORDER — CLONIDINE HCL 0.2 MG PO TABS: 0.4000 mg | ORAL_TABLET | Freq: Two times a day (BID) | ORAL | Status: DC

## 2012-10-15 MED ORDER — AMLODIPINE BESYLATE 10 MG PO TABS: 10.0000 mg | ORAL_TABLET | Freq: Every day | ORAL | Status: DC

## 2012-10-15 NOTE — Assessment & Plan Note (Signed)
SBP remains > 140 at home and office visits. Increase amlodipine to 10 mg daily. Continue to try and get SBP < 140. Instructed to call HF clinic in 2-3 weeks with BP at home. If BP remains high can increase clonidine 0.6 mg bid.  Follow up in 3-4 months.

## 2012-10-15 NOTE — Patient Instructions (Addendum)
Take amlodipine 10 mg daily  Follow up in 3-4 months

## 2012-10-15 NOTE — Telephone Encounter (Signed)
Per Dr Gala Romney increase Clonidine to only 0.4 mg Twice daily, pt is aware

## 2012-10-15 NOTE — Telephone Encounter (Signed)
Pt called back to report that he looked at his bottles when he got home and he is already taking 10 mg of Amlodipine, discussed w/Amy Clegg, NP will increase Clonidine to 0.6 mg Twice daily pt is aware and verbalizes understanding new rx sent to [facility]

## 2012-10-15 NOTE — Progress Notes (Signed)
Patient ID: [name], male   DOB: 1930-02-22, 79 y.o.   MRN: [id number] PCP: Dr. Evlyn Kanner.   HPI:  Square is a 79 y.o. male with a history of  1) severe hypertension    --CT angiogram has been negative for renal artery stenosis.    --Rx limited by bradycardia , hyperkalemia and inability to tolerate several antihypertensives most recently cardura which caused symptomatic hypotension. 2) hyperlipidemia 3) diabetes c/b peripheral neuropathy 4) right bundle-branch block with chronic bradycardia  5) peripheral vascular disease 6) status post right carotid endarterectomy and bilateral common iliac stenting        -Last carotid u/s: R 0-39% L 60-79% 7) chronic LE edema due to venous insufficiency.   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   04/2012 ABIs 0.58 on the right and 0.56 on the left.  Carotid u/s pending June  He returns for follow up. Last visit clonidine was increased to 0.3 mg bid. Occasional dyspnea with exertion. Denies PND/Orthopnea. Playing golf 4 days a week.Complinat with medications. SBP at home 170-180s.   He is followed by Dr Excell Seltzer for PAD. Most recent visit 09/25/12 with no additional interventions.     ROS: All systems negative except as listed in HPI, PMH and Problem List.  Past Medical History  Diagnosis Date  . Carotid stenosis     u/s 5/11: R 0-39% L 40-59% (stable)  . HTN (hypertension)     refractory. unable to cardura in past due to hypotension  . Edema   . PAD (peripheral artery disease)   . Cerebrovascular disease, unspecified   . HLD (hyperlipidemia)   . Bradycardia   . Diabetes mellitus type II   . Hyperkalemia       PHYSICAL EXAM: Filed Vitals:   10/15/12 0832  BP: 160/50  Pulse: 61  Weight: 192 lb 12.8 oz (87.454 kg)  SpO2: 98%   General:  Well  appearing. No resp difficulty HEENT: normal Neck: R scar. normal carotid upstrokes without bruits, JVP  5-6 Lungs: CTA CV: RRR . 2/6 SEM LSB Abd: soft, NT. Moderately distended no bruit, no organomegaly Ext: no clubbing, cyanosis, tr -1+ firm edema.  Skin: warm and dry without rash    ASSESSMENT & PLAN:

## 2012-11-07 ENCOUNTER — Encounter (INDEPENDENT_AMBULATORY_CARE_PROVIDER_SITE_OTHER): Payer: Medicare Other | Admitting: Ophthalmology

## 2012-11-07 DIAGNOSIS — H43819 Vitreous degeneration, unspecified eye: Secondary | ICD-10-CM

## 2012-11-07 DIAGNOSIS — H2510 Age-related nuclear cataract, unspecified eye: Secondary | ICD-10-CM

## 2012-11-07 DIAGNOSIS — E11319 Type 2 diabetes mellitus with unspecified diabetic retinopathy without macular edema: Secondary | ICD-10-CM

## 2012-11-07 DIAGNOSIS — E1165 Type 2 diabetes mellitus with hyperglycemia: Secondary | ICD-10-CM

## 2012-11-07 DIAGNOSIS — H353290 Exudative age-related macular degeneration, unspecified eye, stage unspecified: Secondary | ICD-10-CM

## 2012-11-07 DIAGNOSIS — H35039 Hypertensive retinopathy, unspecified eye: Secondary | ICD-10-CM

## 2012-11-07 DIAGNOSIS — I10 Essential (primary) hypertension: Secondary | ICD-10-CM

## 2012-11-07 DIAGNOSIS — H353190 Nonexudative age-related macular degeneration, unspecified eye, stage unspecified: Secondary | ICD-10-CM

## 2012-12-13 ENCOUNTER — Encounter (INDEPENDENT_AMBULATORY_CARE_PROVIDER_SITE_OTHER): Payer: Medicare Other | Admitting: Ophthalmology

## 2012-12-13 DIAGNOSIS — H2510 Age-related nuclear cataract, unspecified eye: Secondary | ICD-10-CM

## 2012-12-13 DIAGNOSIS — H43819 Vitreous degeneration, unspecified eye: Secondary | ICD-10-CM

## 2012-12-13 DIAGNOSIS — H353290 Exudative age-related macular degeneration, unspecified eye, stage unspecified: Secondary | ICD-10-CM

## 2012-12-13 DIAGNOSIS — H3530 Unspecified macular degeneration: Secondary | ICD-10-CM

## 2012-12-13 DIAGNOSIS — H35039 Hypertensive retinopathy, unspecified eye: Secondary | ICD-10-CM

## 2012-12-13 DIAGNOSIS — I10 Essential (primary) hypertension: Secondary | ICD-10-CM

## 2013-01-14 ENCOUNTER — Ambulatory Visit (HOSPITAL_COMMUNITY)
Admission: RE | Admit: 2013-01-14 | Discharge: 2013-01-14 | Disposition: A | Discharge status: Home / Self Care | Payer: Medicare Other | Source: Ambulatory Visit | Attending: Internal Medicine | Admitting: Internal Medicine

## 2013-01-14 ENCOUNTER — Encounter (HOSPITAL_COMMUNITY): Payer: Self-pay

## 2013-01-14 VITALS — BP 182/62 | HR 86 | Wt 188.0 lb

## 2013-01-14 DIAGNOSIS — I739 Peripheral vascular disease, unspecified: Secondary | ICD-10-CM | POA: Insufficient documentation

## 2013-01-14 DIAGNOSIS — I129 Hypertensive chronic kidney disease with stage 1 through stage 4 chronic kidney disease, or unspecified chronic kidney disease: Secondary | ICD-10-CM | POA: Insufficient documentation

## 2013-01-14 DIAGNOSIS — I5032 Chronic diastolic (congestive) heart failure: Secondary | ICD-10-CM | POA: Insufficient documentation

## 2013-01-14 DIAGNOSIS — N189 Chronic kidney disease, unspecified: Secondary | ICD-10-CM | POA: Insufficient documentation

## 2013-01-14 DIAGNOSIS — I10 Essential (primary) hypertension: Secondary | ICD-10-CM

## 2013-01-14 DIAGNOSIS — R609 Edema, unspecified: Secondary | ICD-10-CM

## 2013-01-14 DIAGNOSIS — E785 Hyperlipidemia, unspecified: Secondary | ICD-10-CM | POA: Insufficient documentation

## 2013-01-14 DIAGNOSIS — E1149 Type 2 diabetes mellitus with other diabetic neurological complication: Secondary | ICD-10-CM | POA: Insufficient documentation

## 2013-01-14 DIAGNOSIS — E1142 Type 2 diabetes mellitus with diabetic polyneuropathy: Secondary | ICD-10-CM | POA: Insufficient documentation

## 2013-01-14 DIAGNOSIS — I4510 Unspecified right bundle-branch block: Secondary | ICD-10-CM | POA: Insufficient documentation

## 2013-01-14 DIAGNOSIS — I679 Cerebrovascular disease, unspecified: Secondary | ICD-10-CM

## 2013-01-14 MED ORDER — CLONIDINE HCL 0.3 MG PO TABS: 0.6000 mg | ORAL_TABLET | Freq: Two times a day (BID) | ORAL | Status: DC

## 2013-01-14 NOTE — Addendum Note (Signed)
Encounter addended by: Noralee Space, RN on: 01/14/2013  9:56 AM<BR>     Documentation filed: Patient Instructions Section, Orders

## 2013-01-14 NOTE — Patient Instructions (Addendum)
Increase clonidine to 0.6 twice a day.  We will schedule you for ultrasound of your kidney arteries.  Return to clinic in 6 month.

## 2013-01-14 NOTE — Addendum Note (Signed)
Encounter addended by: Dolores Patty, MD on: 01/14/2013  9:53 AM<BR>     Documentation filed: Notes Section

## 2013-01-14 NOTE — Progress Notes (Addendum)
[name] ID: [name], male   DOB: 1929-12-02, 79 y.o.   MRN: [id number] PCP: Dr. Evlyn Kanner.   HPI:  [name] is a 79 y.o. male with a history of  1) severe hypertension    --CT angiogram 2009 has been negative for renal artery stenosis.    --Rx limited by bradycardia , hyperkalemia and inability to tolerate several antihypertensives most recently cardura which caused symptomatic hypotension. 2) hyperlipidemia 3) diabetes c/b peripheral neuropathy 4) right bundle-branch block with chronic bradycardia  5) peripheral vascular disease 6) status post right carotid endarterectomy and bilateral common iliac stenting  7) chronic LE edema due to venous insufficiency.   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   04/2012 ABIs 0.58 on the right and 0.56 on the left.  Carotid u/s RICA (S/p CEA) 40-59% LICA 60-79%  He returns for follow up. Recently clonidine was increased to 0.4 mg bid. SBP 160-170 with occasional dips to 140. Recent cataract surgery c/b subconjunctival hemorrhage. Occasional dyspnea with exertion. Denies PND/Orthopnea. Playing golf occasionally. Complinat with medications. No edema. No CP . Stable claudication at 300 yards.   He is followed by Dr Excell Seltzer for PAD. Most recent visit 09/25/12 with no additional interventions.     ROS: All systems negative except as listed in HPI, PMH and Problem List.  Past Medical History  Diagnosis Date  . Carotid stenosis     u/s 5/11: R 0-39% L 40-59% (stable)  . HTN (hypertension)     refractory. unable to cardura in past due to hypotension  . Edema   . PAD (peripheral artery disease)   . Cerebrovascular disease, unspecified   . HLD (hyperlipidemia)   . Bradycardia   . Diabetes mellitus type II   . Hyperkalemia       PHYSICAL EXAM: Filed Vitals:   01/14/13  0914  BP: 182/62  Pulse: 86  Weight: 188 lb (85.276 kg)  SpO2: 97%   General:  Well appearing. No resp difficulty HEENT: normal except for L subconjunctival hemorrhage Neck: R scar. normal carotid upstrokes without bruits, JVP  5-6 Lungs: CTA CV: RRR . 2/6 SEM RSB Abd: soft, NT. ND no organomegaly Ext: no clubbing, cyanosis, tr edema.  Skin: warm and dry without rash  ECG: SR 68 1AVB ( ) RBBB ( ) LAFB No ST-T wave abnormalities.    ASSESSMENT & PLAN:  1. HTN, uncontrolled 2. PAD 3. Chronic renal failure 4. RBBB 5. Chronic diastolic HF/lower extremity edema 6. Trifascicular block - RBBB, LAFB, 1AVB  Volume status looks great. BP remains uncontrolled. Previously intolerant of cardura. Will increase clonidine to 0.6 bid. Will see back in 6 months. Will get renal artery u/s to make sure he hasn't developed RAS. Continue management of PAD per Dr. Excell Seltzer. We reviewed his ECG and he has trifascicular block. We discussed fact he may need PPM at some point.   [name] Ashmore,MD 9:44 AM

## 2013-01-15 NOTE — Addendum Note (Signed)
Encounter addended by: Ernestina Penna on: 01/15/2013  8:51 AM<BR>     Documentation filed: Charges VN

## 2013-01-20 ENCOUNTER — Encounter (HOSPITAL_COMMUNITY): Payer: Medicare Other

## 2013-01-23 ENCOUNTER — Encounter (INDEPENDENT_AMBULATORY_CARE_PROVIDER_SITE_OTHER): Payer: Medicare Other

## 2013-01-23 DIAGNOSIS — I10 Essential (primary) hypertension: Secondary | ICD-10-CM

## 2013-01-23 DIAGNOSIS — I700 Atherosclerosis of aorta: Secondary | ICD-10-CM

## 2013-01-24 ENCOUNTER — Encounter (INDEPENDENT_AMBULATORY_CARE_PROVIDER_SITE_OTHER): Payer: Medicare Other | Admitting: Ophthalmology

## 2013-01-24 DIAGNOSIS — H353290 Exudative age-related macular degeneration, unspecified eye, stage unspecified: Secondary | ICD-10-CM

## 2013-01-24 DIAGNOSIS — H35039 Hypertensive retinopathy, unspecified eye: Secondary | ICD-10-CM

## 2013-01-24 DIAGNOSIS — H3530 Unspecified macular degeneration: Secondary | ICD-10-CM

## 2013-01-24 DIAGNOSIS — I10 Essential (primary) hypertension: Secondary | ICD-10-CM

## 2013-01-24 DIAGNOSIS — H43819 Vitreous degeneration, unspecified eye: Secondary | ICD-10-CM

## 2013-03-14 ENCOUNTER — Encounter (INDEPENDENT_AMBULATORY_CARE_PROVIDER_SITE_OTHER): Payer: Medicare Other | Admitting: Ophthalmology

## 2013-03-14 DIAGNOSIS — H353290 Exudative age-related macular degeneration, unspecified eye, stage unspecified: Secondary | ICD-10-CM

## 2013-03-14 DIAGNOSIS — H35039 Hypertensive retinopathy, unspecified eye: Secondary | ICD-10-CM

## 2013-03-14 DIAGNOSIS — H43819 Vitreous degeneration, unspecified eye: Secondary | ICD-10-CM

## 2013-03-14 DIAGNOSIS — I10 Essential (primary) hypertension: Secondary | ICD-10-CM

## 2013-04-01 ENCOUNTER — Telehealth: Payer: Self-pay

## 2013-04-01 NOTE — Telephone Encounter (Signed)
Dr.Nelson received a DOD call today from Dr.South.Dr.South requesting patient to be soon with Dr.Cooper for infected rt toe.Appointment scheduled with Dr.Cooper 04/02/13 at 3:45 pm.Patient was called,not at home left message with his wife of appointment

## 2013-04-02 ENCOUNTER — Ambulatory Visit: Payer: Medicare Other | Admitting: Cardiovascular Disease

## 2013-04-08 ENCOUNTER — Encounter: Payer: Self-pay | Admitting: Cardiovascular Disease

## 2013-04-08 ENCOUNTER — Ambulatory Visit (INDEPENDENT_AMBULATORY_CARE_PROVIDER_SITE_OTHER): Payer: Medicare Other | Admitting: Cardiovascular Disease

## 2013-04-08 ENCOUNTER — Encounter (HOSPITAL_COMMUNITY): Payer: Self-pay | Admitting: Pharmacy Technician

## 2013-04-08 VITALS — BP 176/58 | HR 55 | Ht 70.25 in | Wt 189.0 lb

## 2013-04-08 DIAGNOSIS — I739 Peripheral vascular disease, unspecified: Secondary | ICD-10-CM

## 2013-04-08 NOTE — Progress Notes (Signed)
  HPI:  79-year-old gentleman presenting for followup evaluation. The patient is followed for peripheral arterial disease. He has undergone bilateral iliac stenting in 2004 severe intermittent claudication. He has been followed since that time with serial duplex scans. His last ABI in December 2013 showed ABIs of 0.58 on the right and 0.56 on the left. When I saw him following that study he had very mild symptoms and continue to remain active with golfing. He was not particularly limited. We recommended continued medical therapy. The patient had a recent renal arterial duplex in September. This demonstrated normal kidney size bilaterally. There was moderate in-stent restenosis on the right side and severe in-stent restenosis on the left side. Peak velocity on the right common iliac was 373 cm/s. Peak velocity on the left was 481 cm/s. The patient reports that about 2 months ago he began having progressive claudication involving the right calf and foot with minimal activity. He was trimming his nails about 3-4 weeks ago and developed an ulcer on the right second toe after breaking the skin. He now has significant pain in the right second toe and has developed an area of nonhealing and discoloration. He has not been walking much in the last month. He has no foot pain at rest but does complain of a lot of pain in his toe.  The patient denies shortness of breath, chest pain, or palpitations. He's had no lightheadedness or syncope. His medications have not changed since his last visit.  Outpatient Encounter Prescriptions as of 04/08/2013  Medication Sig  . amLODipine (NORVASC) 10 MG tablet Take 1 tablet (10 mg total) by mouth daily.  . aspirin 325 MG tablet Take 325 mg by mouth daily.    . cloNIDine (CATAPRES) 0.3 MG tablet Take 2 tablets (0.6 mg total) by mouth 2 (two) times daily.  . clopidogrel (PLAVIX) 75 MG tablet Take 75 mg by mouth daily.    . fenofibrate 160 MG tablet Take 160 mg by mouth daily.      . folic acid (FOLVITE) 1 MG tablet Take 2 mg by mouth daily.    . furosemide (LASIX) 40 MG tablet Take 40 mg by mouth daily.    . hydrALAZINE (APRESOLINE) 25 MG tablet Take 1 tablet (25 mg total) by mouth 3 (three) times daily.  . Insulin Lispro Prot & Lispro (HUMALOG MIX 50/50 PEN [place]) Inject 35-40 Units into the skin 2 (two) times daily. 40 units in the am 40 units in the pm  . pregabalin (LYRICA) 75 MG capsule Take 75 mg by mouth 3 (three) times daily.     Allergies  Allergen Reactions  . Aliskiren Fumarate     REACTION: Reaction not known  . Doxazosin Mesylate     Past Medical History  Diagnosis Date  . Carotid stenosis     u/s 5/11: R 0-39% L 40-59% (stable)  . HTN (hypertension)     refractory. unable to cardura in past due to hypotension  . Edema   . PAD (peripheral artery disease)   . Cerebrovascular disease, unspecified   . HLD (hyperlipidemia)   . Bradycardia   . Diabetes mellitus type II   . Hyperkalemia     ROS: Negative except as per HPI  BP 176/58  Pulse 55  Ht 5' 10.25" (1.784 m)  Wt 189 lb (85.73 kg)  BMI 26.94 kg/m2  PHYSICAL EXAM: Pt is alert and oriented, NAD HEENT: normal Neck: JVP - normal, carotids 2+= with bilateral bruits Lungs: CTA bilaterally   CV: Bradycardic and regular with a soft systolic murmur over the left sternal border Abd: soft, NT, Positive BS, no hepatomegaly Ext: Femoral pulses are 1+ bilaterally. Pedal pulses are nonpalpable bilaterally. There is 1+ edema of the right foot. 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 The patient has nonhealing ulceration and now gangrene of the right distal toe. He now reports progressive claudication symptoms predating this. He has critical limb ischemia and understands that this is a limb threatening issue. Angiography and  revascularization are clearly warranted. I have reviewed the risks, indications, and alternatives to abdominal aortography with bilateral lower extremity runoff and possible PTA/stenting. The patient has chronic kidney disease and has had renal failure following contrast administration in the past. Fortunately, his kidney function has been stable. He does understand the risk of kidney injury related to contrast nephropathy. However, angiography again is warranted in the setting of critical limb ischemia. The patient is scheduled for angiography tomorrow. I advised him to push fluids tonight. His furosemide will be held in the morning. We will bring him in 4 hours before the procedure for IV hydration. He will continue on aspirin and Plavix. The case with be scheduled with Dr. Arida.  [name] 04/08/2013 5:02 PM      

## 2013-04-08 NOTE — Patient Instructions (Signed)
Your physician has requested that you have a peripheral vascular angiogram. This exam is performed at the hospital. During this exam IV contrast is used to look at arterial blood flow. Please review the information sheet given for details.  Your physician recommends that you continue on your current medications as directed. Please refer to the Current Medication list given to you today.  

## 2013-04-09 ENCOUNTER — Inpatient Hospital Stay (HOSPITAL_COMMUNITY)
Admission: RE | Admit: 2013-04-09 | Discharge: 2013-04-11 | DRG: 253 | Disposition: A | Discharge status: Home / Self Care | Payer: Medicare Other | Source: Ambulatory Visit | Attending: Cardiovascular Disease | Admitting: Cardiovascular Disease

## 2013-04-09 ENCOUNTER — Encounter (HOSPITAL_COMMUNITY)
Admission: RE | Disposition: A | Discharge status: Home / Self Care | Payer: Self-pay | Source: Ambulatory Visit | Attending: Cardiovascular Disease

## 2013-04-09 DIAGNOSIS — I708 Atherosclerosis of other arteries: Secondary | ICD-10-CM | POA: Diagnosis present

## 2013-04-09 DIAGNOSIS — Z79899 Other long term (current) drug therapy: Secondary | ICD-10-CM

## 2013-04-09 DIAGNOSIS — T82898A Other specified complication of vascular prosthetic devices, implants and grafts, initial encounter: Secondary | ICD-10-CM | POA: Diagnosis present

## 2013-04-09 DIAGNOSIS — I70269 Atherosclerosis of native arteries of extremities with gangrene, unspecified extremity: Principal | ICD-10-CM | POA: Diagnosis present

## 2013-04-09 DIAGNOSIS — I658 Occlusion and stenosis of other precerebral arteries: Secondary | ICD-10-CM | POA: Diagnosis present

## 2013-04-09 DIAGNOSIS — Z7982 Long term (current) use of aspirin: Secondary | ICD-10-CM

## 2013-04-09 DIAGNOSIS — E119 Type 2 diabetes mellitus without complications: Secondary | ICD-10-CM | POA: Diagnosis present

## 2013-04-09 DIAGNOSIS — N183 Chronic kidney disease, stage 3 unspecified: Secondary | ICD-10-CM | POA: Diagnosis present

## 2013-04-09 DIAGNOSIS — L97509 Non-pressure chronic ulcer of other part of unspecified foot with unspecified severity: Secondary | ICD-10-CM | POA: Diagnosis present

## 2013-04-09 DIAGNOSIS — I6529 Occlusion and stenosis of unspecified carotid artery: Secondary | ICD-10-CM | POA: Diagnosis present

## 2013-04-09 DIAGNOSIS — I7025 Atherosclerosis of native arteries of other extremities with ulceration: Secondary | ICD-10-CM | POA: Diagnosis present

## 2013-04-09 DIAGNOSIS — Y849 Medical procedure, unspecified as the cause of abnormal reaction of the patient, or of later complication, without mention of misadventure at the time of the procedure: Secondary | ICD-10-CM | POA: Diagnosis present

## 2013-04-09 DIAGNOSIS — E785 Hyperlipidemia, unspecified: Secondary | ICD-10-CM | POA: Diagnosis present

## 2013-04-09 DIAGNOSIS — Z7902 Long term (current) use of antithrombotics/antiplatelets: Secondary | ICD-10-CM

## 2013-04-09 DIAGNOSIS — I129 Hypertensive chronic kidney disease with stage 1 through stage 4 chronic kidney disease, or unspecified chronic kidney disease: Secondary | ICD-10-CM | POA: Diagnosis present

## 2013-04-09 DIAGNOSIS — Z794 Long term (current) use of insulin: Secondary | ICD-10-CM

## 2013-04-09 DIAGNOSIS — I739 Peripheral vascular disease, unspecified: Secondary | ICD-10-CM | POA: Diagnosis present

## 2013-04-09 HISTORY — DX: Chronic kidney disease, stage 3 unspecified: N18.30

## 2013-04-09 HISTORY — PX: ABDOMINAL AORTAGRAM: SHX5454

## 2013-04-09 HISTORY — DX: Chronic kidney disease, stage 3 (moderate): N18.3

## 2013-04-09 HISTORY — DX: Shortness of breath: R06.02

## 2013-04-09 HISTORY — PX: BALLOON ANGIOPLASTY, ARTERY: SHX564

## 2013-04-09 HISTORY — DX: Unspecified osteoarthritis, unspecified site: M19.90

## 2013-04-09 LAB — BASIC METABOLIC PANEL
BUN: 39 mg/dL — ABNORMAL HIGH (ref 6–23)
CO2: 25 mEq/L (ref 19–32)
GFR calc non Af Amer: 32 mL/min — ABNORMAL LOW (ref >90)
Glucose, Bld: 59 mg/dL — ABNORMAL LOW (ref 70–99)
Potassium: 4.1 mEq/L (ref 3.5–5.1)
Sodium: 140 mEq/L (ref 135–145)

## 2013-04-09 LAB — GLUCOSE, CAPILLARY
Glucose-Capillary: 52 mg/dL — ABNORMAL LOW (ref 70–99)
Glucose-Capillary: 98 mg/dL (ref 70–99)
Glucose-Capillary: 140 mg/dL — ABNORMAL HIGH (ref 70–99)
Glucose-Capillary: 132 mg/dL — ABNORMAL HIGH (ref 70–99)

## 2013-04-09 LAB — CBC
HCT: 35.4 % — ABNORMAL LOW (ref 39.0–52.0)
Hemoglobin: 12 g/dL — ABNORMAL LOW (ref 13.0–17.0)
MCHC: 33.9 g/dL (ref 30.0–36.0)
RBC: 3.92 MIL/uL — ABNORMAL LOW (ref 4.22–5.81)

## 2013-04-09 LAB — PROTIME-INR: INR: 1.06 (ref 0.00–1.49)

## 2013-04-09 LAB — APTT: aPTT: 34 seconds (ref 24–37)

## 2013-04-09 LAB — ABO/RH: ABO/RH(D): A POS

## 2013-04-09 SURGERY — ABDOMINAL AORTAGRAM
Anesthesia: LOCAL

## 2013-04-09 MED ORDER — HYDRALAZINE HCL 20 MG/ML IJ SOLN
INTRAMUSCULAR | Status: AC
  Administered 2013-04-09: 10 mg via INTRAVENOUS
  Filled 2013-04-09: qty 1

## 2013-04-09 MED ORDER — ONDANSETRON HCL 4 MG/2ML IJ SOLN
INTRAMUSCULAR | Status: AC
  Filled 2013-04-09: qty 2

## 2013-04-09 MED ORDER — MIDAZOLAM HCL 2 MG/2ML IJ SOLN
INTRAMUSCULAR | Status: AC
  Filled 2013-04-09: qty 2

## 2013-04-09 MED ORDER — CLOPIDOGREL BISULFATE 75 MG PO TABS
75.0000 mg | ORAL_TABLET | Freq: Every day | ORAL | Status: DC
  Administered 2013-04-10 – 2013-04-11 (×2): 75 mg via ORAL
  Filled 2013-04-09 (×2): qty 1

## 2013-04-09 MED ORDER — ONDANSETRON HCL 4 MG/2ML IJ SOLN
4.0000 mg | Freq: Four times a day (QID) | INTRAMUSCULAR | Status: DC | PRN
  Administered 2013-04-09: 4 mg via INTRAVENOUS

## 2013-04-09 MED ORDER — SODIUM CHLORIDE 0.9 % IV SOLN
INTRAVENOUS | Status: DC
  Administered 2013-04-09: 10:00:00 via INTRAVENOUS

## 2013-04-09 MED ORDER — SODIUM CHLORIDE 0.9 % IJ SOLN: 3.0000 mL | INTRAMUSCULAR | Status: DC | PRN

## 2013-04-09 MED ORDER — HYDRALAZINE HCL 20 MG/ML IJ SOLN
10.0000 mg | Freq: Once | INTRAMUSCULAR | Status: AC
  Administered 2013-04-09: 10 mg via INTRAVENOUS
  Filled 2013-04-09: qty 1

## 2013-04-09 MED ORDER — SODIUM CHLORIDE 0.9 % IJ SOLN: 3.0000 mL | Freq: Two times a day (BID) | INTRAMUSCULAR | Status: DC

## 2013-04-09 MED ORDER — HYDROCODONE-ACETAMINOPHEN 5-325 MG PO TABS: 1.0000 | ORAL_TABLET | ORAL | Status: DC | PRN

## 2013-04-09 MED ORDER — ASPIRIN 325 MG PO TABS
325.0000 mg | ORAL_TABLET | Freq: Every day | ORAL | Status: DC
  Administered 2013-04-10: 09:00:00 325 mg via ORAL
  Filled 2013-04-09: qty 1

## 2013-04-09 MED ORDER — SODIUM CHLORIDE 0.9 % IV SOLN
INTRAVENOUS | Status: AC
  Administered 2013-04-09: 15:00:00 via INTRAVENOUS

## 2013-04-09 MED ORDER — SODIUM CHLORIDE 0.9 % IV SOLN: 250.0000 mL | INTRAVENOUS | Status: DC | PRN

## 2013-04-09 MED ORDER — LIDOCAINE HCL (PF) 1 % IJ SOLN
INTRAMUSCULAR | Status: AC
  Filled 2013-04-09: qty 30

## 2013-04-09 MED ORDER — STUDY - INVESTIGATIONAL DRUG SIMPLE RECORD (ML)
13.0000 mL | Freq: Once | Status: DC
  Filled 2013-04-09: qty 13

## 2013-04-09 MED ORDER — FENTANYL CITRATE 0.05 MG/ML IJ SOLN
INTRAMUSCULAR | Status: AC
  Filled 2013-04-09: qty 2

## 2013-04-09 MED ORDER — AMLODIPINE BESYLATE 10 MG PO TABS
10.0000 mg | ORAL_TABLET | Freq: Every day | ORAL | Status: DC
Start: 2013-04-10 — End: 2013-04-11
  Administered 2013-04-10 – 2013-04-11 (×2): 10 mg via ORAL
  Filled 2013-04-09 (×2): qty 1

## 2013-04-09 MED ORDER — ASPIRIN 81 MG PO CHEW
81.0000 mg | CHEWABLE_TABLET | ORAL | Status: AC
  Administered 2013-04-09: 81 mg via ORAL
  Filled 2013-04-09: qty 1

## 2013-04-09 MED ORDER — DEXTROSE 50 % IV SOLN
25.0000 mL | Freq: Once | INTRAVENOUS | Status: AC | PRN
  Administered 2013-04-09: 25 mL via INTRAVENOUS

## 2013-04-09 MED ORDER — HEPARIN (PORCINE) IN NACL 2-0.9 UNIT/ML-% IJ SOLN
INTRAMUSCULAR | Status: AC
  Filled 2013-04-09: qty 1000

## 2013-04-09 MED ORDER — HYDRALAZINE HCL 25 MG PO TABS
25.0000 mg | ORAL_TABLET | Freq: Three times a day (TID) | ORAL | Status: DC
  Administered 2013-04-09 – 2013-04-11 (×4): 25 mg via ORAL
  Filled 2013-04-09 (×8): qty 1

## 2013-04-09 MED ORDER — CLONIDINE HCL 0.3 MG PO TABS
0.3000 mg | ORAL_TABLET | Freq: Two times a day (BID) | ORAL | Status: DC
  Administered 2013-04-09 – 2013-04-10 (×3): 0.3 mg via ORAL
  Filled 2013-04-09 (×5): qty 1

## 2013-04-09 MED ORDER — PREGABALIN 25 MG PO CAPS
75.0000 mg | ORAL_CAPSULE | Freq: Three times a day (TID) | ORAL | Status: DC
  Administered 2013-04-09 – 2013-04-11 (×5): 75 mg via ORAL
  Filled 2013-04-09 (×5): qty 3

## 2013-04-09 MED ORDER — ACETAMINOPHEN 325 MG PO TABS: 650.0000 mg | ORAL_TABLET | ORAL | Status: DC | PRN

## 2013-04-09 MED ORDER — FUROSEMIDE 40 MG PO TABS
40.0000 mg | ORAL_TABLET | Freq: Every day | ORAL | Status: DC
  Administered 2013-04-10 – 2013-04-11 (×2): 40 mg via ORAL
  Filled 2013-04-09 (×2): qty 1

## 2013-04-09 MED ORDER — FENOFIBRATE 160 MG PO TABS
160.0000 mg | ORAL_TABLET | Freq: Every day | ORAL | Status: DC
  Administered 2013-04-10 – 2013-04-11 (×2): 160 mg via ORAL
  Filled 2013-04-09 (×2): qty 1

## 2013-04-09 MED ORDER — ALPRAZOLAM 0.5 MG PO TABS
1.0000 mg | ORAL_TABLET | Freq: Once | ORAL | Status: AC
  Administered 2013-04-09: 23:00:00 1 mg via ORAL
  Filled 2013-04-09: qty 2

## 2013-04-09 MED ORDER — LABETALOL HCL 5 MG/ML IV SOLN
INTRAVENOUS | Status: AC
  Filled 2013-04-09: qty 4

## 2013-04-09 MED ORDER — FOLIC ACID 1 MG PO TABS
2.0000 mg | ORAL_TABLET | Freq: Every day | ORAL | Status: DC
  Administered 2013-04-10 – 2013-04-11 (×2): 2 mg via ORAL
  Filled 2013-04-09 (×2): qty 2

## 2013-04-09 MED ORDER — DEXTROSE 50 % IV SOLN
INTRAVENOUS | Status: AC
  Filled 2013-04-09: qty 50

## 2013-04-09 MED ORDER — MORPHINE SULFATE 2 MG/ML IJ SOLN
2.0000 mg | INTRAMUSCULAR | Status: DC | PRN
  Administered 2013-04-09 – 2013-04-10 (×3): 2 mg via INTRAVENOUS
  Filled 2013-04-09 (×3): qty 1

## 2013-04-09 MED ORDER — DEXTROSE 50 % IV SOLN
INTRAVENOUS | Status: AC
  Administered 2013-04-09: 25 mL via INTRAVENOUS
  Filled 2013-04-09: qty 50

## 2013-04-09 MED ORDER — STUDY - INVESTIGATIONAL DRUG SIMPLE RECORD (ML)
50.0000 mL | Freq: Once | Status: DC
  Filled 2013-04-09: qty 50

## 2013-04-09 NOTE — CV Procedure (Signed)
PERIPHERAL VASCULAR PROCEDURE  NAME:  [name]   MRN: [id number] DOB:  [birth year]   ADMIT DATE: 04/09/2013  Performing Cardiologist: Lorine Bears Primary Physician: Julian Hy, MD Primary Cardiologist:  Dr. Excell Seltzer  Procedures Performed:  Abdominal Aortic Angiogram with bilateral iliac angiography  Selective right lower extremity angiography  Balloon angioplasty of the left common iliac artery  Balloon angioplasty of the right common iliac artery   Indication: PAD with gangrene of the right fourth toe  Medications:  Sedation:  1 mg IV Versed, 150 fentanyl   Contrast:  79 ml  Visipaque   Procedural details: The left groin was prepped, draped, and anesthetized with 1% lidocaine. Using modified Seldinger technique, a 5 French sheath was introduced into the left common femoral artery. A 5 Fr Short Pigtail Catheter was advanced of over a  Versicore wire into the descending Aorta to a level just above the renal arteries. A manual injection with CO2 was performed for Abdominal Aortic Angiography.  The catheter was then pulled carefully under fluoroscopy to a level just above the Aortic bifurcation, and a second manual injection was performed to evaluate the iliac arteries. I then used contrast for better visualization of the distal aorta and iliac arteries.  The pigtail catheter was changed over the Versicore wire for A crossover catheter which was then pulled back the aortic bifurcation and the wire was advanced down the contralateral common iliac artery.  The wire was then advanced to the contralateral common femoral artery, the catheter was exchanged into an end hole straight tip catheter which was advanced over the wire to the common femoral artery. Contralateral second-order lower extremity angiography was performed via power injection of 5 ml / sec contrast for a total of 35 ml.   Interventional Procedure:  The patient was found to have severe distal aortic stenosis  extending into bilateral iliac arteries with significant restenosis bilaterally. Arterial access was obtained on the right side with a micropuncture kit. A 7 French Bright tip sheath was inserted. The sheath on the left side was also exchanged into a 7 French Bright tip sheath. The patient was enrolled in the Endomax study and was started on the study drug. Once ACT was therapeutic, both lesions were crossed with the versicolor wire. There was significant and severe calcifications bilaterally. The left common iliac artery was then predilated with a 7 x 40 mm balloon to 6 atmospheres extending into the distal aorta. The same balloon was used to predilate the right common iliac artery into the distal aorta. The plan he was to place bilateral kissing stents into the distal aorta. Both sheaths were advanced to the distal aorta over a dilator.  I advanced the stent from the right side without difficulty. However, been seen the stent on the left side was difficult in the distal aorta and could not be advanced. I pulled the stent back to remove it and advance the dilator . However, it was noted that the stent was not on the balloon. Fluoroscopy showed that the stent was inside the sheath. The sheath was was pulled back carefully under fluoroscopy an odor to exchange it. However, the stent was stuck at the arteriotomy site. Part of it was hanging outside slide was able to pull on it. However, in the process of this we lost the wire position and could not introduce another sheath. Manual pressure was held in the left groin. Given that the patient was fully anticoagulated, there was no option of obtaining  another arterial access without significant disc. I performed angiography with a pigtail catheter for the right side which showed improved appearance but there was still significant stenosis in the distal aorta. I decided to abort the procedure.   the case was extremely difficult due to extensive calcifications and  extremely elevated blood pressure throughout the case. The patient had significant oozing around the sheath throughout the case.  Hemodynamics:  Central Aortic Pressure / Mean Aortic Pressure:  179/59  Findings:  Abdominal aorta:  Extensive calcifications with moderate stenosis in the mid aorta below the renal arteries. In the distal aorta just above the bifurcation there is a 90% stenosis extending into both the ostium of the iliac arteries  Left renal artery:  Patent  Right renal artery:  Patent  Celiac artery:  Not well visualized  Superior mesenteric artery:  Not well visualized  Right common iliac artery:  Normal in size with 80% ostial stenosis. There is 30% instent restenosis.  Right internal iliac artery:  Patent  Right external iliac artery:  Calcified with 20% stenosis.  Right common femoral artery:  Minor irregularities.  Right profunda femoral artery:  Normal  Right superficial femoral artery:  Normal in size with minor irregularities proximally. The vessel is subtotally occluded in the midsegment.   Right popliteal artery:  Normal in size and occluded with reconstitution in the TP trunk.  Right tibial peroneal trunk: Minor irregularities.  Right anterior tibial artery: Patent but with sluggish flow.  Right peroneal artery: Normal  Right posterior tibial artery: normal  Left common iliac artery:  90% instent restenosis with heavy calcifications  Left internal iliac artery: Patent  Left external iliac artery: Minor irregularities.  Conclusions:  1. Severe calcified distal aortic stenosis extending into both iliac arteries with severe in-stent restenosis in the left common iliac artery stent. 2. Subtotally occluded in of the mid right SFA with occluded right popliteal artery. 3 vessel runoff below the knee. 3. Successful balloon angioplasty of bilateral iliac arteries extending into the distal aorta. There was significant residual stenosis in the distal  aorta. The intention was to place kissing stents in distal aorta. However, the stent embolized inside the sheath and loss of arterial access in the process of exchanging the sheath. Thus, the procedure was aborted. The stent was retrieved.   Recommendations:  Hydrate over night due to CKD. Monitor for bleeding. Will consider re attempt stent placement.    Lorine Bears, MD, [facility] 04/09/2013 4:08 PM

## 2013-04-09 NOTE — Research (Signed)
[facility] Informed Consent   Subject Name: [name]  Subject met inclusion and exclusion criteria.  The informed consent form, study requirements and expectations were reviewed with the subject and questions and concerns were addressed prior to the signing of the consent form.  The subject verbalized understanding of the trail requirements.  The subject agreed to participate in the [facility] trial and signed the informed consent.  The informed consent was obtained prior to performance of any protocol-specific procedures for the subject.  A copy of the signed informed consent was given to the subject and a copy was placed in the subject's medical record.  Darenda Fike 04/09/2013, 1200PM

## 2013-04-09 NOTE — H&P (View-Only) (Signed)
HPI:  79 year old gentleman presenting for followup evaluation. The patient is followed for peripheral arterial disease. He has undergone bilateral iliac stenting in 2004 severe intermittent claudication. He has been followed since that time with serial duplex scans. His last ABI in December 2013 showed ABIs of 0.58 on the right and 0.56 on the left. When I saw him following that study he had very mild symptoms and continue to remain active with golfing. He was not particularly limited. We recommended continued medical therapy. The patient had a recent renal arterial duplex in September. This demonstrated normal kidney size bilaterally. There was moderate in-stent restenosis on the right side and severe in-stent restenosis on the left side. Peak velocity on the right common iliac was 373 cm/s. Peak velocity on the left was 481 cm/s. The patient reports that about 2 months ago he began having progressive claudication involving the right calf and foot with minimal activity. He was trimming his nails about 3-4 weeks ago and developed an ulcer on the right second toe after breaking the skin. He now has significant pain in the right second toe and has developed an area of nonhealing and discoloration. He has not been walking much in the last month. He has no foot pain at rest but does complain of a lot of pain in his toe.  The patient denies shortness of breath, chest pain, or palpitations. He's had no lightheadedness or syncope. His medications have not changed since his last visit.  Outpatient Encounter Prescriptions as of 04/08/2013  Medication Sig  . amLODipine (NORVASC) 10 MG tablet Take 1 tablet (10 mg total) by mouth daily.  [name] aspirin 325 MG tablet Take 325 mg by mouth daily.    . cloNIDine (CATAPRES) 0.3 MG tablet Take 2 tablets (0.6 mg total) by mouth 2 (two) times daily.  . clopidogrel (PLAVIX) 75 MG tablet Take 75 mg by mouth daily.    . fenofibrate 160 MG tablet Take 160 mg by mouth daily.      . folic acid (FOLVITE) 1 MG tablet Take 2 mg by mouth daily.    . furosemide (LASIX) 40 MG tablet Take 40 mg by mouth daily.    . hydrALAZINE (APRESOLINE) 25 MG tablet Take 1 tablet (25 mg total) by mouth 3 (three) times daily.  . Insulin Lispro Prot & Lispro (HUMALOG MIX 50/50 PEN [place]) Inject 35-40 Units into the skin 2 (two) times daily. 40 units in the am 40 units in the pm  . pregabalin (LYRICA) 75 MG capsule Take 75 mg by mouth 3 (three) times daily.     Allergies  Allergen Reactions  . Aliskiren Fumarate     REACTION: Reaction not known  . Doxazosin Mesylate     Past Medical History  Diagnosis Date  . Carotid stenosis     u/s 5/11: R 0-39% L 40-59% (stable)  . HTN (hypertension)     refractory. unable to cardura in past due to hypotension  . Edema   . PAD (peripheral artery disease)   . Cerebrovascular disease, unspecified   . HLD (hyperlipidemia)   . Bradycardia   . Diabetes mellitus type II   . Hyperkalemia     ROS: Negative except as per HPI  BP 176/58  Pulse 55  Ht 5' 10.25" (1.784 m)  Wt 189 lb (85.73 kg)  BMI 26.94 kg/m2  PHYSICAL EXAM: Pt is alert and oriented, NAD HEENT: normal Neck: JVP - normal, carotids 2+= with bilateral bruits Lungs: CTA bilaterally  CV: Bradycardic and regular with a soft systolic murmur over the left sternal border Abd: soft, NT, Positive BS, no hepatomegaly Ext: Femoral pulses are 1+ bilaterally. Pedal pulses are nonpalpable bilaterally. There is 1+ edema of the right foot. 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 The patient has nonhealing ulceration and now gangrene of the right distal toe. He now reports progressive claudication symptoms predating this. He has critical limb ischemia and understands that this is a limb threatening issue. Angiography and  revascularization are clearly warranted. I have reviewed the risks, indications, and alternatives to abdominal aortography with bilateral lower extremity runoff and possible PTA/stenting. The patient has chronic kidney disease and has had renal failure following contrast administration in the past. Fortunately, his kidney function has been stable. He does understand the risk of kidney injury related to contrast nephropathy. However, angiography again is warranted in the setting of critical limb ischemia. The patient is scheduled for angiography tomorrow. I advised him to push fluids tonight. His furosemide will be held in the morning. We will bring him in 4 hours before the procedure for IV hydration. He will continue on aspirin and Plavix. The case with be scheduled with Dr. Kirke Corin.  Tonny Bollman 04/08/2013 5:02 PM

## 2013-04-09 NOTE — Interval H&P Note (Signed)
History and Physical Interval Note:  04/09/2013 1:51 PM  [name]  has presented today for surgery, with the diagnosis of PVD  The various methods of treatment have been discussed with the patient and family. After consideration of risks, benefits and other options for treatment, the patient has consented to  Procedure(s): ABDOMINAL AORTAGRAM (N/A) as a surgical intervention .  The patient's history has been reviewed, patient examined, no change in status, stable for surgery.  I have reviewed the patient's chart and labs.  Questions were answered to the patient's satisfaction.     Lorine Bears

## 2013-04-10 ENCOUNTER — Encounter (HOSPITAL_COMMUNITY): Payer: Self-pay | Admitting: General Practice

## 2013-04-10 ENCOUNTER — Encounter (HOSPITAL_COMMUNITY)
Admission: RE | Disposition: A | Discharge status: Home / Self Care | Payer: Self-pay | Source: Ambulatory Visit | Attending: Cardiovascular Disease

## 2013-04-10 DIAGNOSIS — I739 Peripheral vascular disease, unspecified: Secondary | ICD-10-CM | POA: Diagnosis present

## 2013-04-10 DIAGNOSIS — I70219 Atherosclerosis of native arteries of extremities with intermittent claudication, unspecified extremity: Secondary | ICD-10-CM

## 2013-04-10 DIAGNOSIS — I10 Essential (primary) hypertension: Secondary | ICD-10-CM

## 2013-04-10 DIAGNOSIS — I7025 Atherosclerosis of native arteries of other extremities with ulceration: Secondary | ICD-10-CM | POA: Diagnosis present

## 2013-04-10 HISTORY — PX: LOWER EXTREMITY ANGIOGRAM: SHX5508

## 2013-04-10 LAB — CBC
HCT: 32.2 % — ABNORMAL LOW (ref 39.0–52.0)
HCT: 31.1 % — ABNORMAL LOW (ref 39.0–52.0)
Hemoglobin: 10.1 g/dL — ABNORMAL LOW (ref 13.0–17.0)
MCHC: 32 g/dL (ref 30.0–36.0)
RBC: 3.49 MIL/uL — ABNORMAL LOW (ref 4.22–5.81)
RBC: 3.38 MIL/uL — ABNORMAL LOW (ref 4.22–5.81)
RDW: 14.4 % (ref 11.5–15.5)
WBC: 6.2 10*3/uL (ref 4.0–10.5)
WBC: 6.3 10*3/uL (ref 4.0–10.5)

## 2013-04-10 LAB — BASIC METABOLIC PANEL
BUN: 29 mg/dL — ABNORMAL HIGH (ref 6–23)
CO2: 24 mEq/L (ref 19–32)
Chloride: 104 mEq/L (ref 96–112)
GFR calc Af Amer: 39 mL/min — ABNORMAL LOW (ref >90)
Potassium: 5.3 mEq/L — ABNORMAL HIGH (ref 3.5–5.1)
Sodium: 136 mEq/L (ref 135–145)

## 2013-04-10 LAB — POCT ACTIVATED CLOTTING TIME
Activated Clotting Time: 253 seconds
Activated Clotting Time: 206 seconds
Activated Clotting Time: 217 seconds

## 2013-04-10 LAB — GLUCOSE, CAPILLARY
Glucose-Capillary: 156 mg/dL — ABNORMAL HIGH (ref 70–99)
Glucose-Capillary: 50 mg/dL — ABNORMAL LOW (ref 70–99)
Glucose-Capillary: 116 mg/dL — ABNORMAL HIGH (ref 70–99)
Glucose-Capillary: 112 mg/dL — ABNORMAL HIGH (ref 70–99)
Glucose-Capillary: 188 mg/dL — ABNORMAL HIGH (ref 70–99)
Glucose-Capillary: 195 mg/dL — ABNORMAL HIGH (ref 70–99)

## 2013-04-10 SURGERY — ANGIOGRAM, LOWER EXTREMITY
Anesthesia: LOCAL | Laterality: Bilateral

## 2013-04-10 MED ORDER — LABETALOL HCL 5 MG/ML IV SOLN
20.0000 mg | INTRAVENOUS | Status: DC | PRN
  Administered 2013-04-10: 20:00:00 20 mg via INTRAVENOUS

## 2013-04-10 MED ORDER — INSULIN ASPART 100 UNIT/ML ~~LOC~~ SOLN
0.0000 [IU] | Freq: Three times a day (TID) | SUBCUTANEOUS | Status: DC
  Administered 2013-04-10 (×2): 3 [IU] via SUBCUTANEOUS

## 2013-04-10 MED ORDER — MIDAZOLAM HCL 2 MG/2ML IJ SOLN
INTRAMUSCULAR | Status: AC
  Filled 2013-04-10: qty 2

## 2013-04-10 MED ORDER — LIDOCAINE HCL (PF) 1 % IJ SOLN
INTRAMUSCULAR | Status: AC
  Filled 2013-04-10: qty 30

## 2013-04-10 MED ORDER — HYDRALAZINE HCL 20 MG/ML IJ SOLN
INTRAMUSCULAR | Status: AC
  Filled 2013-04-10: qty 1

## 2013-04-10 MED ORDER — HEPARIN (PORCINE) IN NACL 2-0.9 UNIT/ML-% IJ SOLN
INTRAMUSCULAR | Status: AC
  Filled 2013-04-10: qty 1000

## 2013-04-10 MED ORDER — ALUM & MAG HYDROXIDE-SIMETH 200-200-20 MG/5ML PO SUSP
30.0000 mL | ORAL | Status: DC | PRN
  Administered 2013-04-10: 30 mL via ORAL
  Filled 2013-04-10: qty 30

## 2013-04-10 MED ORDER — HYDROMORPHONE HCL PF 2 MG/ML IJ SOLN
INTRAMUSCULAR | Status: AC
  Filled 2013-04-10: qty 1

## 2013-04-10 MED ORDER — LIVING WELL WITH DIABETES BOOK
Freq: Once | Status: AC
  Administered 2013-04-10: 02:00:00
  Filled 2013-04-10: qty 1

## 2013-04-10 MED ORDER — HEPARIN SODIUM (PORCINE) 1000 UNIT/ML IJ SOLN
INTRAMUSCULAR | Status: AC
  Filled 2013-04-10: qty 1

## 2013-04-10 MED ORDER — CLONIDINE HCL 0.3 MG PO TABS
0.3000 mg | ORAL_TABLET | Freq: Once | ORAL | Status: AC
  Administered 2013-04-10: 0.3 mg via ORAL
  Filled 2013-04-10: qty 1

## 2013-04-10 MED ORDER — HYDRALAZINE HCL 20 MG/ML IJ SOLN
10.0000 mg | INTRAMUSCULAR | Status: DC | PRN
  Administered 2013-04-10 (×2): 10 mg via INTRAVENOUS
  Filled 2013-04-10: qty 1

## 2013-04-10 MED ORDER — SODIUM CHLORIDE 0.9 % IV SOLN: 1.0000 mL/kg/h | INTRAVENOUS | Status: DC

## 2013-04-10 MED ORDER — ASPIRIN 81 MG PO CHEW
81.0000 mg | CHEWABLE_TABLET | Freq: Every day | ORAL | Status: DC
  Administered 2013-04-11: 11:00:00 81 mg via ORAL
  Filled 2013-04-10: qty 1

## 2013-04-10 MED ORDER — HYDRALAZINE HCL 20 MG/ML IJ SOLN
10.0000 mg | Freq: Once | INTRAMUSCULAR | Status: AC
  Administered 2013-04-10: 03:00:00 10 mg via INTRAVENOUS
  Filled 2013-04-10: qty 1

## 2013-04-10 MED ORDER — LABETALOL HCL 5 MG/ML IV SOLN
INTRAVENOUS | Status: AC
  Filled 2013-04-10: qty 4

## 2013-04-10 NOTE — Interval H&P Note (Signed)
History and Physical Interval Note:  04/10/2013 1:42 PM  [name]  has presented today for surgery, with the diagnosis of claudication  The various methods of treatment have been discussed with the patient and family. After consideration of risks, benefits and other options for treatment, the patient has consented to  Procedure(s): ILLAC STENT (N/A) as a surgical intervention .  The patient's history has been reviewed, patient examined, no change in status, stable for surgery.  I have reviewed the patient's chart and labs.  Questions were answered to the patient's satisfaction.     Lorine Bears

## 2013-04-10 NOTE — H&P (View-Only) (Signed)
    Subjective:  Rough night. RFA sheath pulled about 0300 difficulty controlling BP. No chest pain or dyspnea.   Objective:  Vital Signs in the last 24 hours: Temp:  [97.4 F (36.3 C)-98.4 F (36.9 C)] 97.7 F (36.5 C) (12/04 0730) Pulse Rate:  [51-78] 55 (12/04 0730) Resp:  [16-22] 18 (12/04 0730) BP: (93-203)/(20-91) 144/20 mmHg (12/04 0730) SpO2:  [93 %-100 %] 99 % (12/04 0730) Weight:  [186 lb 1.1 oz (84.4 kg)] 186 lb 1.1 oz (84.4 kg) (12/04 0415)  Intake/Output from previous day: 12/03 0701 - 12/04 0700 In: 675 [I.V.:675] Out: 1500 [Urine:1500]  Physical Exam: Pt is alert and oriented, NAD HEENT: normal Neck: JVP - normal Lungs: CTA bilaterally CV: RRR  Abd: soft, NT, Positive BS, no hepatomegaly Ext: bilateral groin sites clear without hematoma or ecchymoses  Lab Results:  Recent Labs  04/09/13 1005  WBC 8.0  HGB 12.0*  PLT 196    Recent Labs  04/09/13 1005  NA 140  K 4.1  CL 103  CO2 25  GLUCOSE 59*  BUN 39*  CREATININE 1.89*   No results found for this basename: TROPONINI, CK, MB,  in the last 72 hours  Cardiac Studies: PV procedure note reviewed  Assessment/Plan:  PAD with ulceration. Reviewed with Dr Arida extensively. Plan iliac bifurcational stenting for treatment of severe iliac and distal aortic stenosis. Reviewed plan with patient who is eager to proceed. Consider staged intervention of the right SFA/popliteal depending on his clinical course and wound healing.   Labile malignant HTN. BP stable over past several hours. Continue close monitoring.  Plan: await repeat labs which have been sent. If creatinine stable PTA/stenting today as outlined. Continue ASA and plavix.   Layana Konkel, M.D. 04/10/2013, 10:18 AM     

## 2013-04-10 NOTE — Progress Notes (Signed)
Site area: right groin  Site Prior to Removal:  Level 0  Pressure Applied For 20  MINUTES    Minutes Beginning at 03:25 AM  Manual:   yes  Patient Status During Pull:  WNL  Post Pull Groin Site:  Level 0  Post Pull Instructions Given:  yes  Post Pull Pulses Present:  yes  Dressing Applied:  yes  Comments:

## 2013-04-10 NOTE — Progress Notes (Signed)
    Subjective:  Rough night. RFA sheath pulled about 0300 difficulty controlling BP. No chest pain or dyspnea.   Objective:  Vital Signs in the last 24 hours: Temp:  [97.4 F (36.3 C)-98.4 F (36.9 C)] 97.7 F (36.5 C) (12/04 0730) Pulse Rate:  [51-78] 55 (12/04 0730) Resp:  [16-22] 18 (12/04 0730) BP: (93-203)/(20-91) 144/20 mmHg (12/04 0730) SpO2:  [93 %-100 %] 99 % (12/04 0730) Weight:  [186 lb 1.1 oz (84.4 kg)] 186 lb 1.1 oz (84.4 kg) (12/04 0415)  Intake/Output from previous day: 12/03 0701 - 12/04 0700 In: 675 [I.V.:675] Out: 1500 [Urine:1500]  Physical Exam: Pt is alert and oriented, NAD HEENT: normal Neck: JVP - normal Lungs: CTA bilaterally CV: RRR  Abd: soft, NT, Positive BS, no hepatomegaly Ext: bilateral groin sites clear without hematoma or ecchymoses  Lab Results:  Recent Labs  04/09/13 1005  WBC 8.0  HGB 12.0*  PLT 196    Recent Labs  04/09/13 1005  NA 140  K 4.1  CL 103  CO2 25  GLUCOSE 59*  BUN 39*  CREATININE 1.89*   No results found for this basename: TROPONINI, CK, MB,  in the last 72 hours  Cardiac Studies: PV procedure note reviewed  Assessment/Plan:  PAD with ulceration. Reviewed with Dr Kirke Corin extensively. Plan iliac bifurcational stenting for treatment of severe iliac and distal aortic stenosis. Reviewed plan with patient who is eager to proceed. Consider staged intervention of the right SFA/popliteal depending on his clinical course and wound healing.   Labile malignant HTN. BP stable over past several hours. Continue close monitoring.  Plan: await repeat labs which have been sent. If creatinine stable PTA/stenting today as outlined. Continue ASA and plavix.   Tonny Bollman, M.D. 04/10/2013, 10:18 AM

## 2013-04-10 NOTE — CV Procedure (Signed)
PERIPHERAL VASCULAR PROCEDURE  NAME:  [name]   MRN: [id number] DOB:  January 15, 1930   ADMIT DATE: 04/09/2013  Performing Cardiologist: Lorine Bears Primary Physician: Julian Hy, MD Primary Cardiologist:  Dr. Excell Seltzer  Procedures Performed:  Abdominal Aortic Angiogram without Bi-Iliofemoral Runoff  Bilateral common iliac artery kissing stent placement extending into the distal aorta.    Indication(s):   PAD with gangrene   Consent: The procedure with Risks/Benefits/Alternatives and Indications was reviewed with the patient .  All questions were answered.  Medications:  Sedation:  1 mg IV Versed, 4 mg of Dilaudid  Contrast:  115 ml  Visipaque   Interventional Procedural details: Please see procedure note from yesterday. The patient was brought back for bilateral common iliac artery stent placement The right groin was prepped, draped, and anesthetized with 1% lidocaine. Using modified Seldinger technique, a 4 [place] micropuncture sheath was introduced into the right common femoral artery. This was exchanged into a 6 French bright tip sheath which was advanced to the distal aorta after placing an Amplatzer wire for support over an endhole catheter. The left groin was prepped, draped and anesthetized with 1% lidocaine. In the same technique a 6 [place] Bright tip sheath was advanced into the left common femoral artery. There was significant difficulty in advancing the sheath to the distal aorta. Thus, balloon angioplasty was performed with a 7 mm balloon to the left common iliac artery. I then used Lunderquist wire which was advanced to the thoracic aorta for support. I placed the balloon at the tip of the sheath and inflated it to 1 atmosphere to facilitate balloon-assisted tracking. I was able to advance the sheath to the distal aorta. I then placed an 8 x 39 mm  A Genesis stents in both common iliac arteries extending into the distal aorta. These were deployed  simultaneously to 10 atmospheres. It was the obvious that the left balloon ruptured and could not be pulled back through the sheath. I was able to put most of it inside the sheath and then I decided to pull back the sheath and the balloon together. Part of the balloon came out. However, most of it was still inside dog and could not be pulled back. At this point I felt that the patient might need to go to or to retrieve the retained balloon inside the artery. I discussed the case with Dr. Myra Gianotti and Dr. Excell Seltzer. We then decided to place a bigger sheath and see if we can snare balloon. Throughout this time pressure was held on the left common femoral artery to achieve hemostasis. I then advanced an 8 French sheath into the left common femoral artery which pushed the balloon up. The end of the sheath was opened in order to facilitate advancement of a gooseneck snare which was advanced through the sheath. I was able to snare the partially retained balloon. I then performed angiography through the left common femoral artery with a pigtail catheter which showed that the stents were still underexpanded. There were post dilated again with an 8 mm mustang balloon in a kissing fashion with improved appearance.  The patient received a total of 7000 units of heparin with an ACT maintained above 200 throughout the case. Both sheaths were sutured in place to be removed manually. I attempted Mynx closure device on the right side but the balloon ruptured due to calcifications.   Hemodynamics:  Central Aortic Pressure / Mean Aortic Pressure: 184/63    Conclusions: Successful bilateral kissing  stent placements into the common iliac arteries extending into the distal aorta. This was complicated by ruptured balloon on the left side which was successfully snared.  There was still mild residual stenosis but I felt that any further post dilation might lead to aortic dissection.   Recommendations:   Continue to monitor  closely. There was an estimated at 350 mL of blood loss but the patient remained stable throughout the case.-Hydrate gently overnight. Transfuse as needed to maintain hemoglobin above 8.   Lorine Bears, MD, [facility] 04/10/2013 4:07 PM

## 2013-04-11 ENCOUNTER — Other Ambulatory Visit: Payer: Self-pay | Admitting: Physician Assistant

## 2013-04-11 ENCOUNTER — Encounter (HOSPITAL_COMMUNITY): Payer: Self-pay | Admitting: Physician Assistant

## 2013-04-11 DIAGNOSIS — I739 Peripheral vascular disease, unspecified: Secondary | ICD-10-CM

## 2013-04-11 LAB — CBC
Hemoglobin: 9.1 g/dL — ABNORMAL LOW (ref 13.0–17.0)
MCH: 29.4 pg (ref 26.0–34.0)
MCV: 92.2 fL (ref 78.0–100.0)
RBC: 3.09 MIL/uL — ABNORMAL LOW (ref 4.22–5.81)

## 2013-04-11 MED ORDER — CLONIDINE HCL 0.3 MG PO TABS
0.6000 mg | ORAL_TABLET | Freq: Two times a day (BID) | ORAL | Status: DC
  Administered 2013-04-11: 0.6 mg via ORAL
  Filled 2013-04-11 (×2): qty 2

## 2013-04-11 MED ORDER — ASPIRIN 81 MG PO TABS: 81.0000 mg | ORAL_TABLET | Freq: Every day | ORAL | Status: DC

## 2013-04-11 NOTE — Progress Notes (Signed)
Patient Name: [name] Date of Encounter: 04/11/2013  Active Problems:   DIABETES MELLITUS, TYPE II   PAD (peripheral artery disease)   Atherosclerosis of native arteries of the extremities with ulceration(440.23)   Chronic kidney disease, stage III (moderate)    SUBJECTIVE: Has been out of bed a little and feels his leg is much improved. No chest pain or shortness of breath.  OBJECTIVE Filed Vitals:   04/11/13 0200 04/11/13 0400 04/11/13 0500 04/11/13 0737  BP: 167/37 190/34 162/26 166/36  Pulse:   76 76  Temp:   98.5 F (36.9 C) 98.4 F (36.9 C)  TempSrc:   Oral Oral  Resp:   18 18  Weight:      SpO2:   99% 99%    Intake/Output Summary (Last 24 hours) at 04/11/13 4782 Last data filed at 04/11/13 9562  Gross per 24 hour  Intake    200 ml  Output   2700 ml  Net  -2500 ml   Filed Weights   04/10/13 0415  Weight: 186 lb 1.1 oz (84.4 kg)    PHYSICAL EXAM General: Well developed, well nourished, male in no acute distress. Head: Normocephalic, atraumatic.  Neck: Supple without bruits, JVD not elevated. Lungs:  Resp regular and unlabored, few rales. Heart: RRR, S1, S2, no S3, S4, or murmur; no rub. Abdomen: Soft, non-tender, non-distended, BS + x 4.  Extremities: No clubbing, cyanosis, no edema. Some ecchymosis at bilateral cath sites but no hematomas, bilateral bruits noted Neuro: Alert and oriented X 3. Moves all extremities spontaneously. Psych: Normal affect.  LABS: CBC:  Recent Labs  04/10/13 0950 04/10/13 1405  WBC 6.2 6.3  HGB 10.3* 10.1*  HCT 32.2* 31.1*  MCV 92.3 92.0  PLT 151 143*   INR:  Recent Labs  04/09/13 1005  INR 1.06   Basic Metabolic Panel:  Recent Labs  13/08/65 1005 04/10/13 0950  NA 140 136  K 4.1 5.3*  CL 103 104  CO2 25 24  GLUCOSE 59* 198*  BUN 39* 29*  CREATININE 1.89* 1.81*  CALCIUM 9.1 8.1*   BNP: Pro B Natriuretic peptide (BNP)  Date/Time Value Range Status  08/01/2010  9:17 AM 69.1  0.0 - 100.0  pg/mL Final  03/29/2010 12:00 AM 49.6  0.0-100.0 pg/mL Final   TELE: Sinus rhythm with PVCs and occasional pairs.   Current Medications:  . amLODipine  10 mg Oral Daily  . aspirin  81 mg Oral Daily  . cloNIDine  0.3 mg Oral BID  . clopidogrel  75 mg Oral Q breakfast  . fenofibrate  160 mg Oral Daily  . folic acid  2 mg Oral Daily  . furosemide  40 mg Oral Daily  . hydrALAZINE  25 mg Oral TID  . insulin aspart  0-15 Units Subcutaneous TID WC  . pregabalin  75 mg Oral TID      ASSESSMENT AND PLAN: Active Problems:   PAD (peripheral artery disease)/Atherosclerosis of native arteries of the extremities with ulceration(440.23) - s/p Bilateral common iliac artery kissing stent placement extending into the distal aorta. Continue ASA and Plavix. Follow up in office with ABIs within 2 weeks     Chronic kidney disease, stage III--IV - lab work at next office visit    Diabetes - currently on sliding scale, resume home insulin at discharge   Claudication and PAD - right foot much improved in appearance, follow up in office  Plan: Discharge today with early office followup  arranged.    SignedTheodore Demark , PA-C 8:12 AM 04/11/2013

## 2013-04-11 NOTE — Progress Notes (Signed)
Site area: right groin  Site Prior to Removal:  Level 0  Pressure Applied For 20 MINUTES    Minutes Beginning at 19:50  Manual:   yes  Patient Status During Pull:  WNL  Post Pull Groin Site:  Level 0  Post Pull Instructions Given:  yes  Post Pull Pulses Present:  yes  Dressing Applied:  yes  Comments:

## 2013-04-11 NOTE — Progress Notes (Signed)
Site area: left groin  Site Prior to Removal:  Level 1  Pressure Applied For 20 MINUTES    Minutes Beginning at 19:45  Manual:   yes  Patient Status During Pull:  WNL  Post Pull Groin Site:  Level 1  Post Pull Instructions Given:  yes  Post Pull Pulses Present:  yes  Dressing Applied:  yes  Comments:  Bruise

## 2013-04-11 NOTE — Progress Notes (Signed)
Patient seen, examined. Available data reviewed. Agree with findings, assessment, and plan as outlined by Theodore Demark, PA-C. He looks great today. Foot feels much better. Bilateral groin sites are stable with miild ecchymosis on the right and clear on the left. No hematoma on either side. Plan discharge home today and follow-up in office 1-2 weeks with BMET, ABI's, and office visit. Will resume home meds for HTN at discharge.  Tonny Bollman, M.D. 04/11/2013 8:39 AM

## 2013-04-11 NOTE — Discharge Summary (Signed)
CARDIOLOGY DISCHARGE SUMMARY   Patient ID: [name] MRN: [id number] DOB/AGE: 77/12/1929 77 y.o.  Admit date: 04/09/2013 Discharge date: 04/11/2013  PCP: Julian Hy, MD Primary Cardiologist: [facility]  Primary Discharge Diagnosis:  Atherosclerosis of native arteries of the extremities with ulceration(440.23) - s/p Bilateral common iliac artery kissing stent placement extending into the distal aorta  Secondary Discharge Diagnosis:    DIABETES MELLITUS, TYPE II   PAD (peripheral artery disease)   Chronic kidney disease, stage III (moderate)   Claudication in peripheral vascular disease   Anemia  Procedures:  Abdominal Aortic Angiogram with bilateral iliac angiography; Selective right lower extremity angiography; Balloon angioplasty of the left common iliac artery; Balloon angioplasty of the right common iliac artery; Staged Repeat Abdominal Aortic Angiogram without Bi-Iliofemoral Runoff; Bilateral common iliac artery kissing stent placement extending into the distal aorta  Hospital Course: [name] is a 79 y.o. male with a history of PVD. He was having significant claudication symptoms and gangrene of the right second toe.  He was seen by Dr. Excell Seltzer and scheduled for [facility] cath. He came to the hospital for the procedure on 04/09/2013.  Procedure results are below. He had successful dilatation of the arteries but the stent on the left side could not be advanced. Because of his renal insufficiency, further intervention was deferred. He was admitted and hydrated overnight. On 12/4, he was taken back to the lab and successfully stented. During the procedure on 12/4, there was a problem with the balloon on the left. However, with the use of additional equipment including a larger sheath, the balloon was snared and removed. A good result was obtained. He was held overnight and hydrated.  On 12/5, he was seen by Dr. Excell Seltzer. His right foot had a much improved appearance. His groins had  some ecchymosis but no hematomas. Bilateral bruits were noted but these were undoubtedly present prior to the procedures.   Mr. Neddo was having no chest pain or shortness of breath. He is considered stable for discharge, to be followed closely in the office.  Labs:   Lab Results  Component Value Date   WBC 6.3 04/10/2013   HGB 10.1* 04/10/2013   HCT 31.1* 04/10/2013   MCV 92.0 04/10/2013   PLT 143* 04/10/2013     Recent Labs Lab 04/10/13 0950  NA 136  K 5.3*  CL 104  CO2 24  BUN 29*  CREATININE 1.81*  CALCIUM 8.1*  GLUCOSE 198*    Recent Labs  04/09/13 1005  INR 1.06     PV Cath: 04/09/2013 Findings:  Abdominal aorta: Extensive calcifications with moderate stenosis in the mid aorta below the renal arteries. In the distal aorta just above the bifurcation there is a 90% stenosis extending into both the ostium of the iliac arteries  Left renal artery: Patent  Right renal artery: Patent  Celiac artery: Not well visualized  Superior mesenteric artery: Not well visualized  Right common iliac artery: Normal in size with 80% ostial stenosis. There is 30% instent restenosis.  Right internal iliac artery: Patent  Right external iliac artery: Calcified with 20% stenosis.  Right common femoral artery: Minor irregularities.  Right profunda femoral artery: Normal  Right superficial femoral artery: Normal in size with minor irregularities proximally. The vessel is subtotally occluded in the midsegment.  Right popliteal artery: Normal in size and occluded with reconstitution in the TP trunk.  Right tibial peroneal trunk: Minor irregularities.  Right anterior tibial artery: Patent but with  sluggish flow.  Right peroneal artery: Normal  Right posterior tibial artery: normal  Left common iliac artery: 90% instent restenosis with heavy calcifications  Left internal iliac artery: Patent  Left external iliac artery: Minor irregularities. Conclusions:  1. Severe calcified distal aortic  stenosis extending into both iliac arteries with severe in-stent restenosis in the left common iliac artery stent.  2. Subtotally occluded in of the mid right SFA with occluded right popliteal artery. 3 vessel runoff below the knee.  3. Successful balloon angioplasty of bilateral iliac arteries extending into the distal aorta. There was significant residual stenosis in the distal aorta. The intention was to place kissing stents in distal aorta. However, the stent embolized inside the sheath and loss of arterial access in the process of exchanging the sheath. Thus, the procedure was aborted. The stent was retrieved.  Recommendations:  Hydrate over night due to CKD. Monitor for bleeding. Will consider re attempt stent placement Recath 12/4 Placement of 8 x 39 mm A Genesis stents in both common iliac arteries extending into the distal aorta. Conclusions:  Successful bilateral kissing stent placements into the common iliac arteries extending into the distal aorta. This was complicated by ruptured balloon on the left side which was successfully snared.  There was still mild residual stenosis but I felt that any further post dilation might lead to aortic dissection.  FOLLOW UP PLANS AND APPOINTMENTS Allergies  Allergen Reactions  . Aliskiren Fumarate     REACTION: Reaction not known  . Doxazosin Mesylate      Medication List         amLODipine 10 MG tablet  Commonly known as:  NORVASC  Take 1 tablet (10 mg total) by mouth daily.     aspirin 81 MG tablet  Take 1 tablet (81 mg total) by mouth daily.     cloNIDine 0.3 MG tablet  Commonly known as:  CATAPRES  Take 2 tablets (0.6 mg total) by mouth 2 (two) times daily.     clopidogrel 75 MG tablet  Commonly known as:  PLAVIX  Take 75 mg by mouth daily.     fenofibrate 160 MG tablet  Take 160 mg by mouth daily.     folic acid 1 MG tablet  Commonly known as:  FOLVITE  Take 2 mg by mouth daily.     furosemide 40 MG tablet  Commonly  known as:  LASIX  Take 40 mg by mouth daily.     HUMALOG MIX 50/50 PEN [place]  - Inject 40 Units into the skin 2 (two) times daily. 40 units in the am  - 40 units in the pm     hydrALAZINE 25 MG tablet  Commonly known as:  APRESOLINE  Take 1 tablet (25 mg total) by mouth 3 (three) times daily.     pregabalin 75 MG capsule  Commonly known as:  LYRICA  Take 75 mg by mouth 3 (three) times daily.         Future Appointments Provider Department Dept Phone   04/18/2013 11:00 AM [facility] Pv 1 [facility] SITE 3 ECHO LAB [phone]   04/18/2013 11:30 AM Rosalio Macadamia, NP [facility] [place] Office [phone]   04/18/2013 12:20 PM [facility] [place] Office [phone]   04/25/2013 8:00 AM Sherrie George, MD [facility] [phone]   04/29/2013 8:00 AM [facility] Echo Pv 4 [facility] SITE 3 ECHO LAB [phone]  04/29/2013 9:00 AM [facility] Echo Pv 4 [facility] SITE 3 ECHO LAB [phone]     Follow-up Information   Follow up with Norma Fredrickson, NP On 04/18/2013. (Come in 10:45 am (or before) for 11 am appt for ABIs, lab work and office visit.)    Specialty:  Nurse Practitioner   Contact information:   [address] [place] [address] [phone]       BRING ALL MEDICATIONS WITH YOU TO FOLLOW UP APPOINTMENTS  Time spent with patient to include physician time: 48 min Signed: Theodore Demark, PA-C 04/11/2013, 9:39 AM Co-Sign MD

## 2013-04-13 LAB — TYPE AND SCREEN
Antibody Screen: NEGATIVE
Unit division: 0
Unit division: 0

## 2013-04-14 ENCOUNTER — Telehealth: Payer: Self-pay | Admitting: Cardiovascular Disease

## 2013-04-14 NOTE — Telephone Encounter (Signed)
New problem    patient had two procedure last week .    C/O no Bm for the last 5 days. PCP was contact however out of office.

## 2013-04-14 NOTE — Telephone Encounter (Signed)
Pt c/o feeling bloated and unable to have bm x 5 days since having 2 angiograms last week. Had been npo for 2 days but returned to normal diet with good appetite now. Denies nausea/vomiting. Is drinking prune juice bid and had "a tablespoon of olive oil today". Recommended to patient to increase activity as able and increase water intake. Also to eat high fiber foods and take miralax as directed. Patient states he has used miralax in past with effectiveness Pt verbalizes understanding and agreement.

## 2013-04-18 ENCOUNTER — Other Ambulatory Visit: Payer: Self-pay | Admitting: *Deleted

## 2013-04-18 ENCOUNTER — Other Ambulatory Visit: Payer: Medicare Other

## 2013-04-18 ENCOUNTER — Ambulatory Visit (HOSPITAL_COMMUNITY): Payer: Medicare Other | Attending: Physician Assistant

## 2013-04-18 ENCOUNTER — Ambulatory Visit (INDEPENDENT_AMBULATORY_CARE_PROVIDER_SITE_OTHER): Payer: Medicare Other | Admitting: Nurse Practitioner

## 2013-04-18 VITALS — BP 200/60 | HR 60 | Ht 71.0 in | Wt 181.8 lb

## 2013-04-18 DIAGNOSIS — R209 Unspecified disturbances of skin sensation: Secondary | ICD-10-CM | POA: Insufficient documentation

## 2013-04-18 DIAGNOSIS — I739 Peripheral vascular disease, unspecified: Secondary | ICD-10-CM

## 2013-04-18 DIAGNOSIS — I4510 Unspecified right bundle-branch block: Secondary | ICD-10-CM | POA: Insufficient documentation

## 2013-04-18 DIAGNOSIS — I10 Essential (primary) hypertension: Secondary | ICD-10-CM

## 2013-04-18 DIAGNOSIS — Z87891 Personal history of nicotine dependence: Secondary | ICD-10-CM | POA: Insufficient documentation

## 2013-04-18 DIAGNOSIS — E119 Type 2 diabetes mellitus without complications: Secondary | ICD-10-CM | POA: Insufficient documentation

## 2013-04-18 DIAGNOSIS — E785 Hyperlipidemia, unspecified: Secondary | ICD-10-CM | POA: Insufficient documentation

## 2013-04-18 DIAGNOSIS — I2510 Atherosclerotic heart disease of native coronary artery without angina pectoris: Secondary | ICD-10-CM | POA: Insufficient documentation

## 2013-04-18 LAB — BASIC METABOLIC PANEL
BUN: 31 mg/dL — ABNORMAL HIGH (ref 6–23)
CO2: 29 mEq/L (ref 19–32)
Calcium: 8.7 mg/dL (ref 8.4–10.5)
Chloride: 98 mEq/L (ref 96–112)
Creatinine, Ser: 2 mg/dL — ABNORMAL HIGH (ref 0.4–1.5)
GFR: 35.23 mL/min — ABNORMAL LOW (ref >60.00)
Glucose, Bld: 68 mg/dL — ABNORMAL LOW (ref 70–99)
Potassium: 4.6 mEq/L (ref 3.5–5.1)
Sodium: 134 mEq/L — ABNORMAL LOW (ref 135–145)

## 2013-04-18 MED ORDER — LACTULOSE 10 GM/15ML PO SOLN: 30.0000 g | Freq: Three times a day (TID) | ORAL | Status: DC

## 2013-04-18 NOTE — Progress Notes (Signed)
[name] Date of Birth: Dec 05, 1929 Medical Record #[id number]  History of Present Illness: Mr. Zeimet is seen back today for a post hospital visit. Seen for Dr. Excell Seltzer. He has type 2 DM, PAD, CKD stage 3, claudication with PVD and anemia.   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   Comes back today. Here with his daughter. He says he is doing better. Legs feel much better. No more pain. His toe does not hurt anymore. He has no chest pain. Not short of breath. His only issue is that he has not had a bowel movement since December 3rd. He has used Miralax, stool softeners, dulcolax pills, olive oil, etc - to no avail. Distended and uncomfortable.  He is passing gas.   Current Outpatient Prescriptions  Medication Sig Dispense Refill  . amLODipine (NORVASC) 10 MG tablet Take 1 tablet (10 mg total) by mouth daily.  30 tablet  6  . aspirin 81 MG tablet Take 1 tablet (81 mg total) by mouth daily.      . cloNIDine (CATAPRES) 0.3 MG tablet Take 2 tablets (0.6 mg total) by mouth 2 (two) times daily.  360 tablet  3  . clopidogrel (PLAVIX) 75 MG tablet Take 75 mg by mouth daily.        . fenofibrate 160 MG tablet Take 160 mg by mouth daily.        . folic acid (FOLVITE) 1 MG tablet Take 2 mg by mouth daily.        . furosemide (LASIX) 40 MG tablet Take 40 mg by mouth daily.        . hydrALAZINE (APRESOLINE) 25 MG tablet Take 1 tablet (25 mg total) by mouth 3 (three) times daily.  270 tablet  3  . Insulin Lispro Prot & Lispro (HUMALOG MIX 50/50 PEN [place]) Inject 40 Units into the skin 2 (two) times daily. 40 units in the am 40 units in the pm      . pregabalin (LYRICA) 75 MG capsule Take 75 mg by mouth 3 (three) times daily.        No current facility-administered medications for this visit.    Allergies  Allergen Reactions  . Aliskiren Fumarate    REACTION: Reaction not known  . Doxazosin Mesylate     Past Medical History  Diagnosis Date  . Carotid stenosis     u/s 5/11: R 0-39% L 40-59% (stable)  . HTN (hypertension)     refractory. unable to cardura in past due to hypotension  . Edema   . PAD (peripheral artery disease)   . Cerebrovascular disease, unspecified   . HLD (hyperlipidemia)   . Bradycardia   . Diabetes mellitus type II   . Hyperkalemia   . Shortness of breath   . Arthritis     " IN MY HANDS "  . Chronic kidney disease, stage III (moderate)     Past Surgical History  Procedure Laterality Date  . Carotid endarterectomy  1/01    right  . Balloon angioplasty, artery Bilateral 04/09/2013    LEFT ILIAC & RT COMMON ILIAC     DR ARIDA    . Femoral artery stent  ?   2010    DR COOPER    History  Smoking status  . Former Smoker  . Quit date: 05/08/1994  Smokeless tobacco  . Never  Used    History  Alcohol Use No    Family History  Problem Relation Age of Onset  . Coronary artery disease Father     Review of Systems: The review of systems is per the HPI.  All other systems were reviewed and are negative.  Physical Exam: BP 200/60  Pulse 60  Ht 5\' 11"  (1.803 m) Patient is very pleasant and in no acute distress. Skin is warm and dry. Color is normal.  HEENT is unremarkable. Normocephalic/atraumatic. PERRL. Sclera are nonicteric. Neck is supple. No masses. No JVD. Lungs are clear. Cardiac exam shows a regular rate and rhythm. Abdomen is distended. Decreased bowel sounds.  Extremities are without edema. Gait and ROM are intact. No gross neurologic deficits noted.      LABORATORY DATA: Lab Results  Component Value Date   WBC 6.6 04/11/2013   HGB 9.1* 04/11/2013   HCT 28.5* 04/11/2013   PLT 148* 04/11/2013   GLUCOSE 198* 04/10/2013   CHOL 97 08/01/2010   TRIG 129.0 08/01/2010   HDL 37.70* 08/01/2010   LDLCALC 34 08/01/2010   ALT 16 05/05/2012   AST 17 05/05/2012   NA 136 04/10/2013   K 5.3*  04/10/2013   CL 104 04/10/2013   CREATININE 1.81* 04/10/2013   BUN 29* 04/10/2013   CO2 24 04/10/2013   TSH 1.158 12/22/2010   INR 1.06 04/09/2013   HGBA1C 6.4* 01/01/2011   Interventional Procedural details: Please see procedure note from yesterday. The patient was brought back for bilateral common iliac artery stent placement The right groin was prepped, draped, and anesthetized with 1% lidocaine. Using modified Seldinger technique, a 4 [place] micropuncture sheath was introduced into the right common femoral artery. This was exchanged into a 6 French bright tip sheath which was advanced to the distal aorta after placing an Amplatzer wire for support over an endhole catheter. The left groin was prepped, draped and anesthetized with 1% lidocaine. In the same technique a 6 [place] Bright tip sheath was advanced into the left common femoral artery. There was significant difficulty in advancing the sheath to the distal aorta. Thus, balloon angioplasty was performed with a 7 mm balloon to the left common iliac artery. I then used Lunderquist wire which was advanced to the thoracic aorta for support. I placed the balloon at the tip of the sheath and inflated it to 1 atmosphere to facilitate balloon-assisted tracking. I was able to advance the sheath to the distal aorta. I then placed an 8 x 39 mm A Genesis stents in both common iliac arteries extending into the distal aorta. These were deployed simultaneously to 10 atmospheres. It was the obvious that the left balloon ruptured and could not be pulled back through the sheath. I was able to put most of it inside the sheath and then I decided to pull back the sheath and the balloon together. Part of the balloon came out. However, most of it was still inside dog and could not be pulled back. At this point I felt that the patient might need to go to or to retrieve the retained balloon inside the artery. I discussed the case with Dr. Myra Gianotti and Dr. Excell Seltzer. We then  decided to place a bigger sheath and see if we can snare balloon. Throughout this time pressure was held on the left common femoral artery to achieve hemostasis. I then advanced an 8 French sheath into the left common femoral artery which pushed the balloon up. The end of the  sheath was opened in order to facilitate advancement of a [place] snare which was advanced through the sheath. I was able to snare the partially retained balloon. I then performed angiography through the left common femoral artery with a pigtail catheter which showed that the stents were still underexpanded. There were post dilated again with an 8 mm mustang balloon in a kissing fashion with improved appearance.  The patient received a total of 7000 units of heparin with an ACT maintained above 200 throughout the case. Both sheaths were sutured in place to be removed manually. I attempted Mynx closure device on the right side but the balloon ruptured due to calcifications.  Hemodynamics:  Central Aortic Pressure / Mean Aortic Pressure: 184/63  Conclusions:  Successful bilateral kissing stent placements into the common iliac arteries extending into the distal aorta. This was complicated by ruptured balloon on the left side which was successfully snared.  There was still mild residual stenosis but I felt that any further post dilation might lead to aortic dissection.  Recommendations:  Continue to monitor closely. There was an estimated at 350 mL of blood loss but the patient remained stable throughout the case.-Hydrate gently overnight. Transfuse as needed to maintain hemoglobin above 8.  Lorine Bears, MD, [facility]  04/10/2013  4:07 PM  Assessment / Plan: 1. PVD - s/p bilateral kissing stent placements into the common iliac arteries extending into the distal aorta - comlicated by ruptured balloon on the left side which was successfully snared. There was mild residual stenosis but no further diilation was performed due to threat of  aortic dissection. Has had his doppler study - this actually shows worsening ABIs of .35 on the right and .47 on the left. He was subsequently seen with Dr. Excell Seltzer who examined him and feels like his foot is stable at this time. Dr. Excell Seltzer would like for him to see Dr. Kirke Corin in 2 weeks for follow up.  2. HTN - BP quite high. May be aggravated by his severe constipation - no change in his current medicines - he reports better control at home.   3. HLD   4. DM   5. Severe constipation - will use lactulose but talk with Dr. Evlyn Kanner if no improvement.  Check labs today.   Patient is agreeable to this plan and will call if any problems develop in the interim.   Rosalio Macadamia, RN, ANP-C [facility] [address] [place], [place]  [address]

## 2013-04-18 NOTE — Patient Instructions (Addendum)
Stay on your current medicines  We need to check labs today  See Dr. Kirke Corin in 2 weeks  Lactulose 30 gm three times a day - if this does not work - please be in touch with Dr. Evlyn Kanner  Call the [facility] office at [phone] if you have any questions, problems or concerns.

## 2013-04-21 ENCOUNTER — Emergency Department (HOSPITAL_COMMUNITY): Payer: Medicare Other

## 2013-04-21 ENCOUNTER — Emergency Department (HOSPITAL_COMMUNITY)
Admission: EM | Admit: 2013-04-21 | Discharge: 2013-04-21 | Disposition: A | Discharge status: Home / Self Care | Payer: Medicare Other | Attending: Emergency Medicine | Admitting: Emergency Medicine

## 2013-04-21 ENCOUNTER — Encounter (HOSPITAL_COMMUNITY): Payer: Self-pay | Admitting: Emergency Medicine

## 2013-04-21 DIAGNOSIS — E785 Hyperlipidemia, unspecified: Secondary | ICD-10-CM | POA: Insufficient documentation

## 2013-04-21 DIAGNOSIS — R748 Abnormal levels of other serum enzymes: Secondary | ICD-10-CM | POA: Insufficient documentation

## 2013-04-21 DIAGNOSIS — Z888 Allergy status to other drugs, medicaments and biological substances status: Secondary | ICD-10-CM | POA: Insufficient documentation

## 2013-04-21 DIAGNOSIS — I120 Hypertensive chronic kidney disease with stage 5 chronic kidney disease or end stage renal disease: Secondary | ICD-10-CM | POA: Insufficient documentation

## 2013-04-21 DIAGNOSIS — Z8709 Personal history of other diseases of the respiratory system: Secondary | ICD-10-CM | POA: Insufficient documentation

## 2013-04-21 DIAGNOSIS — I739 Peripheral vascular disease, unspecified: Secondary | ICD-10-CM | POA: Insufficient documentation

## 2013-04-21 DIAGNOSIS — K5730 Diverticulosis of large intestine without perforation or abscess without bleeding: Secondary | ICD-10-CM | POA: Insufficient documentation

## 2013-04-21 DIAGNOSIS — K5900 Constipation, unspecified: Secondary | ICD-10-CM | POA: Insufficient documentation

## 2013-04-21 DIAGNOSIS — I679 Cerebrovascular disease, unspecified: Secondary | ICD-10-CM | POA: Insufficient documentation

## 2013-04-21 DIAGNOSIS — Z79899 Other long term (current) drug therapy: Secondary | ICD-10-CM | POA: Insufficient documentation

## 2013-04-21 DIAGNOSIS — E119 Type 2 diabetes mellitus without complications: Secondary | ICD-10-CM | POA: Insufficient documentation

## 2013-04-21 DIAGNOSIS — Z794 Long term (current) use of insulin: Secondary | ICD-10-CM | POA: Insufficient documentation

## 2013-04-21 DIAGNOSIS — R141 Gas pain: Secondary | ICD-10-CM | POA: Insufficient documentation

## 2013-04-21 DIAGNOSIS — R609 Edema, unspecified: Secondary | ICD-10-CM | POA: Insufficient documentation

## 2013-04-21 DIAGNOSIS — Z7902 Long term (current) use of antithrombotics/antiplatelets: Secondary | ICD-10-CM | POA: Insufficient documentation

## 2013-04-21 DIAGNOSIS — R142 Eructation: Secondary | ICD-10-CM | POA: Insufficient documentation

## 2013-04-21 DIAGNOSIS — M129 Arthropathy, unspecified: Secondary | ICD-10-CM | POA: Insufficient documentation

## 2013-04-21 DIAGNOSIS — Z87891 Personal history of nicotine dependence: Secondary | ICD-10-CM | POA: Insufficient documentation

## 2013-04-21 DIAGNOSIS — Z8679 Personal history of other diseases of the circulatory system: Secondary | ICD-10-CM | POA: Insufficient documentation

## 2013-04-21 DIAGNOSIS — K921 Melena: Secondary | ICD-10-CM | POA: Insufficient documentation

## 2013-04-21 DIAGNOSIS — N183 Chronic kidney disease, stage 3 unspecified: Secondary | ICD-10-CM | POA: Insufficient documentation

## 2013-04-21 DIAGNOSIS — I6529 Occlusion and stenosis of unspecified carotid artery: Secondary | ICD-10-CM | POA: Insufficient documentation

## 2013-04-21 DIAGNOSIS — Z7982 Long term (current) use of aspirin: Secondary | ICD-10-CM | POA: Insufficient documentation

## 2013-04-21 LAB — CBC WITH DIFFERENTIAL/PLATELET
Basophils Absolute: 0 10*3/uL (ref 0.0–0.1)
Basophils Relative: 1 % (ref 0–1)
Eosinophils Relative: 1 % (ref 0–5)
HCT: 24.8 % — ABNORMAL LOW (ref 39.0–52.0)
Hemoglobin: 8.4 g/dL — ABNORMAL LOW (ref 13.0–17.0)
Lymphocytes Relative: 19 % (ref 12–46)
Lymphs Abs: 1 10*3/uL (ref 0.7–4.0)
MCHC: 33.9 g/dL (ref 30.0–36.0)
MCV: 89.9 fL (ref 78.0–100.0)
Monocytes Absolute: 0.4 10*3/uL (ref 0.1–1.0)
Monocytes Relative: 7 % (ref 3–12)
Neutro Abs: 4 10*3/uL (ref 1.7–7.7)
RBC: 2.76 MIL/uL — ABNORMAL LOW (ref 4.22–5.81)
RDW: 14.7 % (ref 11.5–15.5)

## 2013-04-21 LAB — URINALYSIS W MICROSCOPIC + REFLEX CULTURE
Hgb urine dipstick: NEGATIVE
Leukocytes, UA: NEGATIVE
Nitrite: NEGATIVE
Protein, ur: NEGATIVE mg/dL
Specific Gravity, Urine: 1.008 (ref 1.005–1.030)
Urobilinogen, UA: 0.2 mg/dL (ref 0.0–1.0)

## 2013-04-21 LAB — COMPREHENSIVE METABOLIC PANEL
ALT: 9 U/L (ref 0–53)
AST: 16 U/L (ref 0–37)
CO2: 22 mEq/L (ref 19–32)
Calcium: 8.4 mg/dL (ref 8.4–10.5)
Creatinine, Ser: 1.85 mg/dL — ABNORMAL HIGH (ref 0.50–1.35)
GFR calc Af Amer: 38 mL/min — ABNORMAL LOW (ref >90)
GFR calc non Af Amer: 33 mL/min — ABNORMAL LOW (ref >90)
Potassium: 4.3 mEq/L (ref 3.5–5.1)
Total Protein: 6.1 g/dL (ref 6.0–8.3)

## 2013-04-21 MED ORDER — SODIUM CHLORIDE 0.9 % IV BOLUS (SEPSIS)
1000.0000 mL | Freq: Once | INTRAVENOUS | Status: AC
  Administered 2013-04-21: 1000 mL via INTRAVENOUS

## 2013-04-21 MED ORDER — IOHEXOL 300 MG/ML  SOLN
25.0000 mL | INTRAMUSCULAR | Status: AC
  Administered 2013-04-21: 25 mL via ORAL

## 2013-04-21 MED ORDER — PREGABALIN 75 MG PO CAPS
75.0000 mg | ORAL_CAPSULE | Freq: Once | ORAL | Status: AC
  Administered 2013-04-21: 75 mg via ORAL
  Filled 2013-04-21: qty 1

## 2013-04-21 MED ORDER — GLYCERIN (LAXATIVE) 2.1 G RE SUPP
1.0000 | Freq: Once | RECTAL | Status: AC
  Administered 2013-04-21: 1 via RECTAL
  Filled 2013-04-21: qty 1

## 2013-04-21 MED ORDER — FLEET ENEMA 7-19 GM/118ML RE ENEM
1.0000 | ENEMA | Freq: Once | RECTAL | Status: AC
  Administered 2013-04-21: 1 via RECTAL
  Filled 2013-04-21: qty 1

## 2013-04-21 NOTE — ED Provider Notes (Signed)
CSN: [id number]     Arrival date & time 04/21/13  4098 History   None    Chief Complaint  Patient presents with  . Constipation   (Consider location/radiation/quality/duration/timing/severity/associated sxs/prior Treatment) HPI Comments: 79 yo male with hx of peripheral vascular disease--recent stent placement left iliac and right common iliac on 04/09/13--HTN, DM, and CKD who presents with c/o severe constipation over past 2 weeks. Reports having no BM since 04/06/13, but is passing flatus. Has tried dulcolax orally, miralax, and diet modifications with no improvement. PCP prescribed lactolose which he has been using over the past 3 days with no improvement as well. However, patient notes recent decreased urination since taking the lactulose. Denies fever, chills, N/V, abd pain. Endorses abd distention. No hx of bowel obstruction in the past.  The history is provided by the patient.    Past Medical History  Diagnosis Date  . Carotid stenosis     u/s 5/11: R 0-39% L 40-59% (stable)  . HTN (hypertension)     refractory. unable to cardura in past due to hypotension  . Edema   . PAD (peripheral artery disease)   . Cerebrovascular disease, unspecified   . HLD (hyperlipidemia)   . Bradycardia   . Diabetes mellitus type II   . Hyperkalemia   . Shortness of breath   . Arthritis     " IN MY HANDS "  . Chronic kidney disease, stage III (moderate)    Past Surgical History  Procedure Laterality Date  . Carotid endarterectomy  1/01    right  . Balloon angioplasty, artery Bilateral 04/09/2013    LEFT ILIAC & RT COMMON ILIAC     DR ARIDA    . Femoral artery stent  ?   2010    DR COOPER   Family History  Problem Relation Age of Onset  . Coronary artery disease Father    History  Substance Use Topics  . Smoking status: Former Smoker    Quit date: 05/08/1994  . Smokeless tobacco: Never Used  . Alcohol Use: No    Review of Systems  Constitutional: Negative for fever.  HENT:  Negative for sore throat.   Respiratory: Negative for cough.   Cardiovascular: Negative for chest pain.  Gastrointestinal: Positive for constipation and abdominal distention. Negative for nausea, vomiting, abdominal pain, diarrhea, anal bleeding and rectal pain.  Genitourinary: Positive for decreased urine volume. Negative for difficulty urinating.  Musculoskeletal: Negative for back pain.  Skin: Negative for rash.  Neurological: Negative for headaches.  Hematological: Negative for adenopathy.  Psychiatric/Behavioral: Negative for agitation.    Allergies  Aliskiren fumarate; Doxazosin mesylate; and Metformin and related  Home Medications   Current Outpatient Rx  Name  Route  Sig  Dispense  Refill  . amLODipine (NORVASC) 10 MG tablet   Oral   Take 1 tablet (10 mg total) by mouth daily.   30 tablet   6   . aspirin 81 MG tablet   Oral   Take 1 tablet (81 mg total) by mouth daily.         . cloNIDine (CATAPRES) 0.3 MG tablet   Oral   Take 2 tablets (0.6 mg total) by mouth 2 (two) times daily.   360 tablet   3   . clopidogrel (PLAVIX) 75 MG tablet   Oral   Take 75 mg by mouth daily.          . fenofibrate 160 MG tablet   Oral   Take 160 mg  by mouth daily.           . folic acid (FOLVITE) 1 MG tablet   Oral   Take 1 mg by mouth 2 (two) times daily.          . furosemide (LASIX) 40 MG tablet   Oral   Take 40 mg by mouth daily.           . hydrALAZINE (APRESOLINE) 25 MG tablet   Oral   Take 1 tablet (25 mg total) by mouth 3 (three) times daily.   270 tablet   3     Please send quickly, pt almost out of med   . Insulin Lispro Prot & Lispro (HUMALOG MIX 50/50 PEN [place])   Subcutaneous   Inject 40 Units into the skin 2 (two) times daily. 40 units in the am 40 units in the pm         . lactulose (CHRONULAC) 10 GM/15ML solution   Oral   Take 45 mLs (30 g total) by mouth 3 (three) times daily.   240 mL   0   . pregabalin (LYRICA) 75 MG capsule    Oral   Take 75 mg by mouth 3 (three) times daily.           BP 188/58  Pulse 86  Temp(Src) 97.4 F (36.3 C) (Oral)  Resp 18  Ht 5\' 10"  (1.778 m)  Wt 180 lb (81.647 kg)  BMI 25.83 kg/m2  SpO2 98% Physical Exam  Nursing note and vitals reviewed. Constitutional: He is oriented to person, place, and time. He appears well-developed and well-nourished.  HENT:  Head: Normocephalic and atraumatic.  Right Ear: External ear normal.  Left Ear: External ear normal.  Eyes: EOM are normal.  Neck: Normal range of motion.  Cardiovascular: Normal rate, regular rhythm, normal heart sounds and intact distal pulses.   Pulmonary/Chest: Effort normal and breath sounds normal.  Abdominal: Soft. Bowel sounds are normal. He exhibits distension. There is no tenderness.  Genitourinary: Rectal exam shows external hemorrhoid.  There is a large amount of brown clay like stool palpable in the rectum that is blood streaked upon removal  Musculoskeletal: He exhibits edema ( mild bilateral lower extremity nonpitting edema).  Neurological: He is alert and oriented to person, place, and time.  Skin: Skin is warm and dry.  Psychiatric: He has a normal mood and affect.    ED Course  Procedures (including critical care time) Labs Review Labs Reviewed  CBC WITH DIFFERENTIAL - Abnormal; Notable for the following:    RBC 2.76 (*)    Hemoglobin 8.4 (*)    HCT 24.8 (*)    All other components within normal limits  COMPREHENSIVE METABOLIC PANEL - Abnormal; Notable for the following:    BUN 32 (*)    Creatinine, Ser 1.85 (*)    Albumin 2.8 (*)    GFR calc non Af Amer 33 (*)    GFR calc Af Amer 38 (*)    All other components within normal limits  URINALYSIS W MICROSCOPIC + REFLEX CULTURE   Imaging Review Ct Abdomen Pelvis Wo Contrast  04/21/2013   CLINICAL DATA:  Constipation, elevated creatinine  EXAM: CT ABDOMEN AND PELVIS WITHOUT CONTRAST  TECHNIQUE: Multidetector CT imaging of the abdomen and pelvis was  performed following the standard protocol without intravenous contrast.  COMPARISON:  01/01/2011; 09/18/2007  FINDINGS: The lack of intravenous contrast limits the ability to evaluate solid abdominal organs.  Normal hepatic contour. There is  potential layering high attenuation debris within the neck of an otherwise normal-appearing gallbladder (axial image 27, series 2, coronal image 58). No definitive gallbladder wall thickening or pericholecystic fluid on this noncontrast examination. No ascites.  Grossly unchanged noncontrast appearance of the bilateral kidneys. No renal stones. No stones are seen along the expected course of either ureter or within the urinary bladder. There is moderate distension of the urinary bladder which is otherwise normal in appearance on this noncontrast examination. Grossly unchanged symmetric perinephric stranding, similar to the prior examination. No urinary obstruction.  Normal noncontrast appearance of the bilateral adrenal glands, pancreas and spleen.  Ingested enteric contrast extends to the level of the mid/distal small bowel. Moderate colonic stool burden without evidence of obstruction. Scattered colonic diverticulosis without evidence of diverticulitis on this noncontrast examination. The appendix is not visualized, however there is no inflammatory change within the right lower abdominal quadrant. No pneumoperitoneum, pneumatosis or portal venous gas.  Moderate to large amount amount of predominantly calcified atherosclerotic plaque within a slightly ectatic infrarenal abdominal aorta, measuring approximately 28 mm in greatest oblique axial dimension (image 52, series 2), similar to the prior examination. Kissing stents are noted within the distal abdominal aorta extending to the bilateral common iliac arteries. Scattered shotty retroperitoneal lymph nodes are individually not enlarged by size criteria with index left-sided periaortic lymph node measuring approximately 0.8 cm  in diameter (image 48, series 2), similar to the prior examination. Subtle appearance of a "misty mesentery" within the ventral aspect of the abdomen (image 37, series 2) also appears grossly similar to the prior examination. Symmetric shotty bilateral pelvic lymph nodes are grossly unchanged with index pelvic node measuring 1 cm in greatest short axis diameter (image 101, series 2), unchanged.  Borderline enlarged prostate.  No free fluid within the pelvis.  Limited visualization of the lower thorax demonstrates slightly asymmetric subpleural ground-glass atelectasis, worse within the image left lower lung, similar to prior abdominal CT. Right basilar pleural calcifications are re- demonstrated. No pleural effusion.  Cardiomegaly. There is mild diffuse attenuation of the intracranial blood pool suggestive of anemia. Possible calcifications within the aortic valve leaflets. No pericardial effusion.  No acute or aggressive osseus abnormalities. Lumbar spine degenerative change per worse at L5-S1.  IMPRESSION: 1. Moderate colonic stool burden without evidence of obstruction. 2. No evidence of nephrolithiasis or urinary obstruction. 3. Colonic diverticulosis without evidence of diverticulitis. 4. Possible cholelithiasis without definite evidence of cholecystitis on this noncontrast examination. Further evaluation could be performed with gallbladder ultrasound as clinically indicated. 5. Grossly unchanged ectasia of the infrarenal abdominal aorta measuring approximately 28 mm in diameter. Recommend follow up by [place] in 5 years. This recommendation follows ACR consensus guidelines: White Paper of the ACR Incidental Findings Committee II on Vascular Findings. J Am Coll Radiol 2013; 10:789-794. 6. Grossly unchanged right basilar pleural calcifications, the sequela of prior asbestos exposure.   Electronically Signed   By: Simonne Come M.D.   On: 04/21/2013 14:37   Dg Abd Acute W/chest  04/21/2013   CLINICAL DATA:   Constipation. Abdominal pain and shortness of breath.  EXAM: ACUTE ABDOMEN SERIES (ABDOMEN 2 VIEW & CHEST 1 VIEW)  COMPARISON:  Two-view chest 05/05/2012.  FINDINGS: The heart size is exaggerated by low lung volumes. Atherosclerotic calcifications are again noted at the aortic arch. Minimal atelectasis is present at the lung bases.  A large amount stool is present throughout the colon. This small bowel is unremarkable. There is some thickening around the distal rectosigmoid  colon.  Proximal iliac stents are present. Degenerative changes in the lumbar spine are stable.  IMPRESSION: 1. A large amount of stool is present throughout the colon. 2. Apparent wall thickening in the distal rectosigmoid colon. This raises the possibility of inflammatory bowel disease or possibly neoplasm with a relative obstruction. Endoscopy or CT may be useful for further evaluation. 3. Atherosclerosis.   Electronically Signed   By: Gennette Pac M.D.   On: 04/21/2013 11:11    EKG Interpretation   None       MDM   1. Constipation    79 yo male presents with constipation. Passing flatus, no N/V. Abd distended, nontender. Acute abd series reveals large amount of stool, concerns for neoplastic disease vs inflammatory bowel disease with relative obstruction. CT pursued and did not reveal any evidence of bowel obstruction, no masses. There is diverticulosis without diverticulitis. Labs reveal CKD and anemia, similar to previous. Will plan for fleet enema here and then continue with outpatient treatment of miralax and lactulose, f/u with PCP.  Patient had some results with fleet, mostly liquid return. Repeat abd exam reveals abd soft and nontender. Felt stable for return. Strict return instructions discussed and provided to the patient in writing at time of d/c.    Simmie Davies, NP 04/21/13 [phone]

## 2013-04-21 NOTE — ED Notes (Signed)
PT sleeping, VSS. Pt still has not had a BM.

## 2013-04-21 NOTE — Telephone Encounter (Signed)
FYI. Pt going to ER.

## 2013-04-21 NOTE — ED Notes (Signed)
Pt has not had a bowel movement since 11/30.  Pt has taken all kinds of laxatives and stool softeners with no relief.

## 2013-04-21 NOTE — ED Notes (Signed)
Pt appropriate for discharge but doesn't have any clothing. Daughter bringing pt's belongings. Pt transported to Pod C while waiting for daughter for comfort measures.

## 2013-04-21 NOTE — ED Notes (Signed)
Pt finished contrast. Pt will go to CT at 1330. Pt made aware of plan.

## 2013-04-23 NOTE — ED Provider Notes (Signed)
Medical screening examination/treatment/procedure(s) were performed by non-physician practitioner and as supervising physician I was immediately available for consultation/collaboration.  EKG Interpretation   None         Laray Anger, DO 04/23/13 [phone]

## 2013-04-25 ENCOUNTER — Encounter (INDEPENDENT_AMBULATORY_CARE_PROVIDER_SITE_OTHER): Payer: Medicare Other | Admitting: Ophthalmology

## 2013-04-25 DIAGNOSIS — I10 Essential (primary) hypertension: Secondary | ICD-10-CM

## 2013-04-25 DIAGNOSIS — H353290 Exudative age-related macular degeneration, unspecified eye, stage unspecified: Secondary | ICD-10-CM

## 2013-04-25 DIAGNOSIS — H35039 Hypertensive retinopathy, unspecified eye: Secondary | ICD-10-CM

## 2013-04-25 DIAGNOSIS — H43819 Vitreous degeneration, unspecified eye: Secondary | ICD-10-CM

## 2013-04-29 ENCOUNTER — Ambulatory Visit (HOSPITAL_COMMUNITY): Payer: Medicare Other | Attending: Cardiology

## 2013-04-29 ENCOUNTER — Ambulatory Visit (HOSPITAL_COMMUNITY): Payer: Medicare Other

## 2013-04-29 ENCOUNTER — Encounter: Payer: Self-pay | Admitting: Cardiology

## 2013-04-29 DIAGNOSIS — Z87891 Personal history of nicotine dependence: Secondary | ICD-10-CM | POA: Insufficient documentation

## 2013-04-29 DIAGNOSIS — I708 Atherosclerosis of other arteries: Secondary | ICD-10-CM | POA: Insufficient documentation

## 2013-04-29 DIAGNOSIS — I4510 Unspecified right bundle-branch block: Secondary | ICD-10-CM | POA: Insufficient documentation

## 2013-04-29 DIAGNOSIS — I700 Atherosclerosis of aorta: Secondary | ICD-10-CM | POA: Insufficient documentation

## 2013-04-29 DIAGNOSIS — I739 Peripheral vascular disease, unspecified: Secondary | ICD-10-CM

## 2013-04-29 DIAGNOSIS — I2510 Atherosclerotic heart disease of native coronary artery without angina pectoris: Secondary | ICD-10-CM | POA: Insufficient documentation

## 2013-04-29 DIAGNOSIS — E785 Hyperlipidemia, unspecified: Secondary | ICD-10-CM | POA: Insufficient documentation

## 2013-04-29 DIAGNOSIS — E119 Type 2 diabetes mellitus without complications: Secondary | ICD-10-CM | POA: Insufficient documentation

## 2013-04-29 DIAGNOSIS — I10 Essential (primary) hypertension: Secondary | ICD-10-CM | POA: Insufficient documentation

## 2013-05-06 ENCOUNTER — Encounter: Payer: Self-pay | Admitting: Cardiovascular Disease

## 2013-05-13 ENCOUNTER — Encounter: Payer: Self-pay | Admitting: Cardiovascular Disease

## 2013-05-13 ENCOUNTER — Encounter (INDEPENDENT_AMBULATORY_CARE_PROVIDER_SITE_OTHER): Payer: Self-pay

## 2013-05-13 ENCOUNTER — Ambulatory Visit (INDEPENDENT_AMBULATORY_CARE_PROVIDER_SITE_OTHER): Payer: Medicare Other | Admitting: Cardiovascular Disease

## 2013-05-13 VITALS — BP 199/41 | HR 64 | Ht 70.5 in | Wt 188.0 lb

## 2013-05-13 DIAGNOSIS — I739 Peripheral vascular disease, unspecified: Secondary | ICD-10-CM

## 2013-05-13 NOTE — Patient Instructions (Signed)
Your physician has requested that you have a peripheral vascular angiogram. This exam is performed at the hospital. During this exam IV contrast is used to look at arterial blood flow. Please review the information sheet given for details.

## 2013-05-14 ENCOUNTER — Encounter (HOSPITAL_COMMUNITY): Payer: Self-pay

## 2013-05-14 ENCOUNTER — Encounter: Payer: Self-pay | Admitting: Cardiovascular Disease

## 2013-05-16 ENCOUNTER — Encounter: Payer: Self-pay | Admitting: Cardiovascular Disease

## 2013-05-16 NOTE — Progress Notes (Signed)
  HPI:  80-year-old gentleman presenting for followup evaluation. The patient is followed for peripheral arterial disease. He has undergone bilateral iliac stenting in 2004 severe intermittent claudication. He has been followed since that time with serial duplex scans. His last ABI in December 2013 showed ABIs of 0.58 on the right and 0.56 on the left. He then developed some acute worsening of right leg and foot claudication with minimal walking. He developed an ulcer on the right second toe after breaking the skin with a small gangrenous area. He underwent recent  bilateral common iliac artery kissing stent placement extending into the distal aorta. The procedure was extremely difficult due to severe calcification. He reported improved rest pain on the right side with no symptoms on the left side. He has been able to resume playing golf. However, post procedure was divided on the right side at 0.38. The ulceration is still there and seems to be slightly bigger.  Outpatient Encounter Prescriptions as of 05/13/2013  Medication Sig  . amLODipine (NORVASC) 10 MG tablet Take 1 tablet (10 mg total) by mouth daily.  . aspirin 81 MG tablet Take 1 tablet (81 mg total) by mouth daily.  . cloNIDine (CATAPRES) 0.3 MG tablet Take 2 tablets (0.6 mg total) by mouth 2 (two) times daily.  . clopidogrel (PLAVIX) 75 MG tablet Take 75 mg by mouth daily.   . fenofibrate 160 MG tablet Take 160 mg by mouth daily.    . folic acid (FOLVITE) 1 MG tablet Take 1 mg by mouth 2 (two) times daily.   . furosemide (LASIX) 40 MG tablet Take 40 mg by mouth daily.    . hydrALAZINE (APRESOLINE) 25 MG tablet Take 1 tablet (25 mg total) by mouth 3 (three) times daily.  . Insulin Lispro Prot & Lispro (HUMALOG MIX 50/50 PEN [place]) Inject 40 Units into the skin 2 (two) times daily. 40 units in the am 40 units in the pm  . pregabalin (LYRICA) 75 MG capsule Take 75 mg by mouth 3 (three) times daily.   . [DISCONTINUED] lactulose (CHRONULAC) 10  GM/15ML solution Take 45 mLs (30 g total) by mouth 3 (three) times daily.    Allergies  Allergen Reactions  . Aliskiren Fumarate     REACTION: Reaction not known  . Doxazosin Mesylate   . Metformin And Related     Past Medical History  Diagnosis Date  . Carotid stenosis     u/s 5/11: R 0-39% L 40-59% (stable)  . HTN (hypertension)     refractory. unable to cardura in past due to hypotension  . Edema   . PAD (peripheral artery disease)   . Cerebrovascular disease, unspecified   . HLD (hyperlipidemia)   . Bradycardia   . Diabetes mellitus type II   . Hyperkalemia   . Shortness of breath   . Arthritis     " IN MY HANDS "  . Chronic kidney disease, stage III (moderate)     ROS: Negative except as per HPI  BP 199/41  Pulse 64  Ht 5' 10.5" (1.791 m)  Wt 188 lb (85.276 kg)  BMI 26.58 kg/m2  PHYSICAL EXAM: Pt is alert and oriented, NAD HEENT: normal Neck: JVP - normal, carotids 2+= with bilateral bruits Lungs: CTA bilaterally CV: Bradycardic and regular with a soft systolic murmur over the left sternal border Abd: soft, NT, Positive BS, no hepatomegaly Ext: Femoral pulses are 1+ on the right side and +2 on the left side . Pedal pulses   are nonpalpable bilaterally. There is 1+ edema of the right foot. 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  Although his chest pain has improved, he continues to have an ulceration. There is also dependent rubor. I still think that the right leg is at risk for significant tissue loss. Aortoiliac d suggested that there is still a stenosis in the right external iliac artery which was not treated. It is possible that angiography and underestimated the severity in that area. By physical exam, the right femoral pulse is definitely diminished compared to the left side. There is also  subtotal occlusion of the right SFA and an occluded right popliteal artery. I recommend proceeding with repeat angiography with focusing on the right leg with the idea of treating the stenosis in the right external iliac artery first. This likely will be followed by an attempt to treat his SFA and popliteal artery via an antegrade access to the femoral artery.   I have reviewed the risks, indications, and alternatives to abdominal aortography with bilateral lower extremity runoff and possible PTA/stenting. The patient has chronic kidney disease and has had renal failure following contrast administration in the past. He does understand the risk of kidney injury related to contrast nephropathy.  We will bring him in 4 hours before the procedure for IV hydration. He will continue on aspirin and Plavix.    Kathlyn Sacramento 05/16/2013 3:49 PM

## 2013-05-21 ENCOUNTER — Encounter (HOSPITAL_COMMUNITY)
Admission: RE | Disposition: A | Discharge status: Home / Self Care | Payer: Self-pay | Source: Ambulatory Visit | Attending: Cardiovascular Disease

## 2013-05-21 ENCOUNTER — Ambulatory Visit (HOSPITAL_COMMUNITY)
Admission: RE | Admit: 2013-05-21 | Discharge: 2013-05-21 | Disposition: A | Discharge status: Home / Self Care | Payer: Medicare Other | Source: Ambulatory Visit | Attending: Cardiovascular Disease | Admitting: Cardiovascular Disease

## 2013-05-21 DIAGNOSIS — M19049 Primary osteoarthritis, unspecified hand: Secondary | ICD-10-CM | POA: Insufficient documentation

## 2013-05-21 DIAGNOSIS — E785 Hyperlipidemia, unspecified: Secondary | ICD-10-CM | POA: Insufficient documentation

## 2013-05-21 DIAGNOSIS — T82898A Other specified complication of vascular prosthetic devices, implants and grafts, initial encounter: Secondary | ICD-10-CM | POA: Insufficient documentation

## 2013-05-21 DIAGNOSIS — I679 Cerebrovascular disease, unspecified: Secondary | ICD-10-CM | POA: Insufficient documentation

## 2013-05-21 DIAGNOSIS — Z7982 Long term (current) use of aspirin: Secondary | ICD-10-CM | POA: Insufficient documentation

## 2013-05-21 DIAGNOSIS — L98499 Non-pressure chronic ulcer of skin of other sites with unspecified severity: Secondary | ICD-10-CM | POA: Insufficient documentation

## 2013-05-21 DIAGNOSIS — Z794 Long term (current) use of insulin: Secondary | ICD-10-CM | POA: Insufficient documentation

## 2013-05-21 DIAGNOSIS — E119 Type 2 diabetes mellitus without complications: Secondary | ICD-10-CM | POA: Insufficient documentation

## 2013-05-21 DIAGNOSIS — I739 Peripheral vascular disease, unspecified: Secondary | ICD-10-CM | POA: Insufficient documentation

## 2013-05-21 DIAGNOSIS — I708 Atherosclerosis of other arteries: Secondary | ICD-10-CM | POA: Insufficient documentation

## 2013-05-21 DIAGNOSIS — N183 Chronic kidney disease, stage 3 unspecified: Secondary | ICD-10-CM | POA: Insufficient documentation

## 2013-05-21 DIAGNOSIS — Y831 Surgical operation with implant of artificial internal device as the cause of abnormal reaction of the patient, or of later complication, without mention of misadventure at the time of the procedure: Secondary | ICD-10-CM | POA: Insufficient documentation

## 2013-05-21 DIAGNOSIS — I6529 Occlusion and stenosis of unspecified carotid artery: Secondary | ICD-10-CM | POA: Insufficient documentation

## 2013-05-21 DIAGNOSIS — I129 Hypertensive chronic kidney disease with stage 1 through stage 4 chronic kidney disease, or unspecified chronic kidney disease: Secondary | ICD-10-CM | POA: Insufficient documentation

## 2013-05-21 DIAGNOSIS — L97509 Non-pressure chronic ulcer of other part of unspecified foot with unspecified severity: Secondary | ICD-10-CM | POA: Insufficient documentation

## 2013-05-21 HISTORY — PX: ABDOMINAL AORTAGRAM: SHX5454

## 2013-05-21 LAB — CBC
HCT: 33.4 % — ABNORMAL LOW (ref 39.0–52.0)
Hemoglobin: 11 g/dL — ABNORMAL LOW (ref 13.0–17.0)
MCH: 29.4 pg (ref 26.0–34.0)
MCHC: 32.9 g/dL (ref 30.0–36.0)
MCV: 89.3 fL (ref 78.0–100.0)
Platelets: 122 10*3/uL — ABNORMAL LOW (ref 150–400)
RBC: 3.74 MIL/uL — ABNORMAL LOW (ref 4.22–5.81)
RDW: 15 % (ref 11.5–15.5)
WBC: 4.5 10*3/uL (ref 4.0–10.5)

## 2013-05-21 LAB — BASIC METABOLIC PANEL
BUN: 32 mg/dL — ABNORMAL HIGH (ref 6–23)
CHLORIDE: 105 meq/L (ref 96–112)
CO2: 27 mEq/L (ref 19–32)
Calcium: 9 mg/dL (ref 8.4–10.5)
Creatinine, Ser: 1.77 mg/dL — ABNORMAL HIGH (ref 0.50–1.35)
GFR calc non Af Amer: 34 mL/min — ABNORMAL LOW (ref >90)
GFR, EST AFRICAN AMERICAN: 40 mL/min — AB (ref >90)
Glucose, Bld: 78 mg/dL (ref 70–99)
POTASSIUM: 4.9 meq/L (ref 3.7–5.3)
Sodium: 143 mEq/L (ref 137–147)

## 2013-05-21 LAB — PROTIME-INR
INR: 1.05 (ref 0.00–1.49)
PROTHROMBIN TIME: 13.5 s (ref 11.6–15.2)

## 2013-05-21 LAB — GLUCOSE, CAPILLARY: Glucose-Capillary: 83 mg/dL (ref 70–99)

## 2013-05-21 SURGERY — ABDOMINAL AORTAGRAM
Anesthesia: LOCAL

## 2013-05-21 MED ORDER — HYDRALAZINE HCL 20 MG/ML IJ SOLN
10.0000 mg | INTRAMUSCULAR | Status: DC | PRN
  Administered 2013-05-21: 13:00:00 via INTRAVENOUS

## 2013-05-21 MED ORDER — ASPIRIN 81 MG PO CHEW: 81.0000 mg | CHEWABLE_TABLET | ORAL | Status: DC

## 2013-05-21 MED ORDER — ACETAMINOPHEN 325 MG PO TABS: 650.0000 mg | ORAL_TABLET | ORAL | Status: DC | PRN

## 2013-05-21 MED ORDER — HYDROMORPHONE HCL PF 2 MG/ML IJ SOLN
INTRAMUSCULAR | Status: AC
  Filled 2013-05-21: qty 1

## 2013-05-21 MED ORDER — SODIUM CHLORIDE 0.9 % IV SOLN: 250.0000 mL | INTRAVENOUS | Status: DC | PRN

## 2013-05-21 MED ORDER — SODIUM CHLORIDE 0.9 % IV SOLN
INTRAVENOUS | Status: DC
  Administered 2013-05-21: 08:00:00 via INTRAVENOUS

## 2013-05-21 MED ORDER — HYDRALAZINE HCL 20 MG/ML IJ SOLN
INTRAMUSCULAR | Status: AC
  Filled 2013-05-21: qty 1

## 2013-05-21 MED ORDER — MIDAZOLAM HCL 2 MG/2ML IJ SOLN
INTRAMUSCULAR | Status: AC
  Filled 2013-05-21: qty 2

## 2013-05-21 MED ORDER — SODIUM CHLORIDE 0.9 % IV SOLN: INTRAVENOUS | Status: DC

## 2013-05-21 MED ORDER — SODIUM CHLORIDE 0.9 % IJ SOLN: 3.0000 mL | INTRAMUSCULAR | Status: DC | PRN

## 2013-05-21 MED ORDER — HEPARIN (PORCINE) IN NACL 2-0.9 UNIT/ML-% IJ SOLN
INTRAMUSCULAR | Status: AC
  Filled 2013-05-21: qty 1000

## 2013-05-21 MED ORDER — LIDOCAINE HCL (PF) 1 % IJ SOLN
INTRAMUSCULAR | Status: AC
  Filled 2013-05-21: qty 30

## 2013-05-21 MED ORDER — SODIUM CHLORIDE 0.9 % IJ SOLN: 3.0000 mL | Freq: Two times a day (BID) | INTRAMUSCULAR | Status: DC

## 2013-05-21 NOTE — Discharge Instructions (Signed)
Angiography, Care After °Refer to this sheet in the next few weeks. These instructions provide you with information on caring for yourself after your procedure. Your health care provider may also give you more specific instructions. Your treatment has been planned according to current medical practices, but problems sometimes occur. Call your health care provider if you have any problems or questions after your procedure.  °WHAT TO EXPECT AFTER THE PROCEDURE °After your procedure, it is typical to have the following sensations: °· Minor discomfort or tenderness and a small bump at the catheter insertion site. The bump should usually decrease in size and tenderness within 1 to 2 weeks. °· Any bruising will usually fade within 2 to 4 weeks. °HOME CARE INSTRUCTIONS  °· You may need to keep taking blood thinners if they were prescribed for you. Only take over-the-counter or prescription medicines for pain, fever, or discomfort as directed by your health care provider. °· Do not apply powder or lotion to the site. °· Do not sit in a bathtub, swimming pool, or whirlpool for 5 to 7 days. °· You may shower 24 hours after the procedure. Remove the bandage (dressing) and gently wash the site with plain soap and water. Gently pat the site dry. °· Inspect the site at least twice daily. °· Limit your activity for the first 48 hours. Do not bend, squat, or lift anything over 20 lb (9 kg) or as directed by your health care provider. °· Do not drive home if you are discharged the day of the procedure. Have someone else drive you. Follow instructions about when you can drive or return to work. °SEEK MEDICAL CARE IF: °· You get lightheaded when standing up. °· You have drainage (other than a small amount of blood on the dressing). °· You have chills. °· You have a fever. °· You have redness, warmth, swelling, or pain at the insertion site. °SEEK IMMEDIATE MEDICAL CARE IF:  °· You develop chest pain or shortness of breath, feel faint,  or pass out. °· You have bleeding, swelling larger than a walnut, or drainage from the catheter insertion site. °· You develop pain, discoloration, coldness, or severe bruising in the leg or arm that held the catheter. °· You develop bleeding from any other place, such as the bowels. You may see bright red blood in your urine or stools, or your stools may appear black and tarry. °· You have heavy bleeding from the site. If this happens, hold pressure on the site. °MAKE SURE YOU: °· Understand these instructions. °· Will watch your condition. °· Will get help right away if you are not doing well or get worse. °Document Released: 11/10/2004 Document Revised: 12/25/2012 Document Reviewed: 09/16/2012 °[employer]® Patient Information ©2014 [employer]. ° °

## 2013-05-21 NOTE — CV Procedure (Signed)
    PERIPHERAL VASCULAR PROCEDURE  NAME:  [name]   MRN: [id number] DOB:  [birth year]   ADMIT DATE: 05/21/2013  Performing Cardiologist: Kathlyn Sacramento Primary Physician: Sheela Stack, MD Primary Cardiologist:  Dr. Burt Knack.   Procedures Performed:  Abdominal Aortic Angiogram with Bi-Iliofemoral Runoff    Indication(s):   Critical Limb Ischemia. The patient had bilateral iliac kissing stents placed extending into the distal aorta recently. He continued to have slow healing of the gangrenous second toe. Iliac duplex showed high velocity in the right external iliac artery suggestive of significant stenosis. Thus, I decided to proceed with angiography for evaluation.   Consent: The procedure with Risks/Benefits/Alternatives and Indications was reviewed with the patient .  All questions were answered.  Medications:  Sedation:  1  mg IV Versed, 1 mg of Dilaudid    Contrast:  30 ml  [place]   Procedural details: The right groin was prepped, draped, and anesthetized with 1% lidocaine. Using modified Seldinger technique, a 4 French  micropuncture sheath was introduced into the right  common femoral artery. this was exchanged into a 5 French sheath over the versicore wire.  A 5 Fr Short Pigtail Catheter was advanced of over a  Versicore wire into the  distal aorta   below the renal arteries. A power injection of 15 ml/sec contrast over 1 sec was performed for Abdominal Aortic Angiography.   iliac angiography was then performed in the LAO position. The pigtail catheter was then exchanged into an endhole straight tip 4 French catheter. This was placed in the distal aorta. A pullback with pressure recording was performed from the distal aorta into the right common femoral artery. The peak to peak gradient was only 10 mm mercury.  The catheter was removed. The sheath will be removed manually. The patient tolerated the procedure well with no immediate complications.     Hemodynamics:  Central Aortic Pressure / Mean Aortic Pressure: 203/58 . The patient was given 10 mg of IV hydralazine.   Findings:  Abdominal aorta: The distal aorta is heavily calcified. Bilateral kissing  noted which are patent with mild instent restenosis.   Right common iliac artery: Patent stent with 20% ostial in-stent restenosis   Right internal iliac artery: Patent   Right external iliac artery: 50% proximal stenosis   Right common femoral artery: Minor irregularities.   Left common iliac artery:  Patent stent with 30% ostial stenosis and 30% mid in-stent restenosis. More significant stenosis cannot be excluded due to difficult visualization related to heavy calcifications.   Left internal iliac artery: Patent   Left external iliac artery: Minor irregularities.   Conclusions: 1. Patent iliac stent extending into the distal aorta with mild instent restenosis   2. Moderate right external artery stenosis with a pressure gradient of only 10 mm mercury.  Recommendations:  Continue medical therapy. If the right toe does not heal completely, I will consider proceeding with RSFA /popliteal artery angioplasty   Kathlyn Sacramento, MD, [facility] 05/21/2013 11:42 AM

## 2013-05-21 NOTE — H&P (View-Only) (Signed)
HPI:  79 year old gentleman presenting for followup evaluation. The patient is followed for peripheral arterial disease. He has undergone bilateral iliac stenting in 2004 severe intermittent claudication. He has been followed since that time with serial duplex scans. His last ABI in December 2013 showed ABIs of 0.58 on the right and 0.56 on the left. He then developed some acute worsening of right leg and foot claudication with minimal walking. He developed an ulcer on the right second toe after breaking the skin with a small gangrenous area. He underwent recent  bilateral common iliac artery kissing stent placement extending into the distal aorta. The procedure was extremely difficult due to severe calcification. He reported improved rest pain on the right side with no symptoms on the left side. He has been able to resume playing golf. However, post procedure was divided on the right side at 0.38. The ulceration is still there and seems to be slightly bigger.  Outpatient Encounter Prescriptions as of 05/13/2013  Medication Sig  . amLODipine (NORVASC) 10 MG tablet Take 1 tablet (10 mg total) by mouth daily.  [name] aspirin 81 MG tablet Take 1 tablet (81 mg total) by mouth daily.  . cloNIDine (CATAPRES) 0.3 MG tablet Take 2 tablets (0.6 mg total) by mouth 2 (two) times daily.  . clopidogrel (PLAVIX) 75 MG tablet Take 75 mg by mouth daily.   . fenofibrate 160 MG tablet Take 160 mg by mouth daily.    . folic acid (FOLVITE) 1 MG tablet Take 1 mg by mouth 2 (two) times daily.   . furosemide (LASIX) 40 MG tablet Take 40 mg by mouth daily.    . hydrALAZINE (APRESOLINE) 25 MG tablet Take 1 tablet (25 mg total) by mouth 3 (three) times daily.  . Insulin Lispro Prot & Lispro (HUMALOG MIX 50/50 PEN [place]) Inject 40 Units into the skin 2 (two) times daily. 40 units in the am 40 units in the pm  . pregabalin (LYRICA) 75 MG capsule Take 75 mg by mouth 3 (three) times daily.   . [DISCONTINUED] lactulose (CHRONULAC) 10  GM/15ML solution Take 45 mLs (30 g total) by mouth 3 (three) times daily.    Allergies  Allergen Reactions  . Aliskiren Fumarate     REACTION: Reaction not known  . Doxazosin Mesylate   . Metformin And Related     Past Medical History  Diagnosis Date  . Carotid stenosis     u/s 5/11: R 0-39% L 40-59% (stable)  . HTN (hypertension)     refractory. unable to cardura in past due to hypotension  . Edema   . PAD (peripheral artery disease)   . Cerebrovascular disease, unspecified   . HLD (hyperlipidemia)   . Bradycardia   . Diabetes mellitus type II   . Hyperkalemia   . Shortness of breath   . Arthritis     " IN MY HANDS "  . Chronic kidney disease, stage III (moderate)     ROS: Negative except as per HPI  BP 199/41  Pulse 64  Ht 5' 10.5" (1.791 m)  Wt 188 lb (85.276 kg)  BMI 26.58 kg/m2  PHYSICAL EXAM: Pt is alert and oriented, NAD HEENT: normal Neck: JVP - normal, carotids 2+= with bilateral bruits Lungs: CTA bilaterally CV: Bradycardic and regular with a soft systolic murmur over the left sternal border Abd: soft, NT, Positive BS, no hepatomegaly Ext: Femoral pulses are 1+ on the right side and +2 on the left side . Pedal pulses  are nonpalpable bilaterally. There is 1+ edema of the right foot. 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  Although his chest pain has improved, he continues to have an ulceration. There is also dependent rubor. I still think that the right leg is at risk for significant tissue loss. Aortoiliac d suggested that there is still a stenosis in the right external iliac artery which was not treated. It is possible that angiography and underestimated the severity in that area. By physical exam, the right femoral pulse is definitely diminished compared to the left side. There is also  subtotal occlusion of the right SFA and an occluded right popliteal artery. I recommend proceeding with repeat angiography with focusing on the right leg with the idea of treating the stenosis in the right external iliac artery first. This likely will be followed by an attempt to treat his SFA and popliteal artery via an antegrade access to the femoral artery.   I have reviewed the risks, indications, and alternatives to abdominal aortography with bilateral lower extremity runoff and possible PTA/stenting. The patient has chronic kidney disease and has had renal failure following contrast administration in the past. He does understand the risk of kidney injury related to contrast nephropathy.  We will bring him in 4 hours before the procedure for IV hydration. He will continue on aspirin and Plavix.    Kathlyn Sacramento 05/16/2013 3:49 PM

## 2013-05-21 NOTE — Progress Notes (Signed)
Discharge instructions given per MD order.  Pt and Cg able to verbalize understanding.  Pt to car via wheelchair.  

## 2013-05-21 NOTE — Progress Notes (Signed)
pt's BP at 1322 was 200/49  Pt stated that this was his normal.  Dr Fletcher Anon paged.  Orders were given for 10 of hydralizine now and q 1 hour for systolic BP greater that 903.  Pt's was reassessed at 1332.  BP at 1337 was 156/44.  PT states he feels fine.  Will continue to monitor closely

## 2013-05-21 NOTE — Interval H&P Note (Signed)
History and Physical Interval Note:  05/21/2013 10:51 AM  Brittany R Renken  has presented today for surgery, with the diagnosis of Claudication  The various methods of treatment have been discussed with the patient and family. After consideration of risks, benefits and other options for treatment, the patient has consented to  Procedure(s): ABDOMINAL AORTAGRAM (N/A) as a surgical intervention .  The patient's history has been reviewed, patient examined, no change in status, stable for surgery.  I have reviewed the patient's chart and labs.  Questions were answered to the patient's satisfaction.     Kathlyn Sacramento

## 2013-05-22 LAB — GLUCOSE, CAPILLARY: Glucose-Capillary: 92 mg/dL (ref 70–99)

## 2013-06-03 ENCOUNTER — Ambulatory Visit (INDEPENDENT_AMBULATORY_CARE_PROVIDER_SITE_OTHER): Payer: Medicare Other | Admitting: Cardiovascular Disease

## 2013-06-03 ENCOUNTER — Encounter: Payer: Self-pay | Admitting: Cardiovascular Disease

## 2013-06-03 VITALS — BP 202/48 | HR 55 | Ht 70.5 in | Wt 188.6 lb

## 2013-06-03 DIAGNOSIS — I739 Peripheral vascular disease, unspecified: Secondary | ICD-10-CM

## 2013-06-03 DIAGNOSIS — I10 Essential (primary) hypertension: Secondary | ICD-10-CM

## 2013-06-03 MED ORDER — HYDRALAZINE HCL 50 MG PO TABS: 50.0000 mg | ORAL_TABLET | Freq: Three times a day (TID) | ORAL | Status: DC

## 2013-06-03 NOTE — Patient Instructions (Signed)
Your physician recommends that you schedule a follow-up appointment in: the first week of April (week after duplex)  Your physician has requested that you have an  aorto-iliac duplex. During this test, an ultrasound is used to evaluate the aorta. Allow 30 minutes for this exam. Do not eat after midnight the day before and avoid carbonated beverages. To be done the last week in March.  Your physician has recommended you make the following change in your medication:  Increase hydralazine to 50 mg by mouth three times daily

## 2013-06-04 ENCOUNTER — Encounter: Payer: Self-pay | Admitting: Cardiovascular Disease

## 2013-06-04 NOTE — Progress Notes (Signed)
HPI:  80 year old gentleman presenting for followup evaluation. The patient is followed for peripheral arterial disease. He has undergone bilateral iliac stenting in 2004 for severe intermittent claudication. He has been followed since that time with serial duplex scans. His last ABI in December 2013 showed ABIs of 0.58 on the right and 0.56 on the left. He then developed some acute worsening of right leg and foot claudication with minimal walking. He developed an ulcer on the right second toe after breaking the skin with a small gangrenous area. He underwent recent  bilateral common iliac artery kissing stent placement extending into the distal aorta. The procedure was extremely difficult due to severe calcification. He reported improved rest pain on the right side with no symptoms on the left side. Post procedure was divided on the right side at 0.38. Duplex showed possible right external iliac artery stenosis with a peak velocity slightly above 400. Thus, I proceeded with angiography which showed patent stents in the distal aorta extending into bilateral common iliac arteries. There was moderate stenosis involving the right external iliac artery with a peak gradient of less than 10 mm mercury at rest. Thus, no intervention was performed. Overall, he continues to feel better. He denies significant claudication. The ulceration on the toe seems to be improving slowly.    Outpatient Encounter Prescriptions as of 06/03/2013  Medication Sig  . amLODipine (NORVASC) 10 MG tablet Take 1 tablet (10 mg total) by mouth daily.  [name] aspirin 81 MG tablet Take 1 tablet (81 mg total) by mouth daily.  . cloNIDine (CATAPRES) 0.3 MG tablet Take 2 tablets (0.6 mg total) by mouth 2 (two) times daily.  . clopidogrel (PLAVIX) 75 MG tablet Take 75 mg by mouth daily.   . fenofibrate 160 MG tablet Take 160 mg by mouth daily.    . folic acid (FOLVITE) 1 MG tablet Take 1 mg by mouth 2 (two) times daily.   . furosemide  (LASIX) 40 MG tablet Take 40 mg by mouth daily.    . hydrALAZINE (APRESOLINE) 50 MG tablet Take 1 tablet (50 mg total) by mouth 3 (three) times daily.  . Insulin Lispro Prot & Lispro (HUMALOG MIX 50/50 PEN [place]) Inject 40 Units into the skin 2 (two) times daily. 40 units in the am 40 units in the pm  . pregabalin (LYRICA) 75 MG capsule Take 75 mg by mouth 3 (three) times daily.   . [DISCONTINUED] hydrALAZINE (APRESOLINE) 25 MG tablet Take 1 tablet (25 mg total) by mouth 3 (three) times daily.    Allergies  Allergen Reactions  . Aliskiren Fumarate     REACTION: Reaction not known  . Doxazosin Mesylate   . Metformin And Related     Past Medical History  Diagnosis Date  . Carotid stenosis     u/s 5/11: R 0-39% L 40-59% (stable)  . HTN (hypertension)     refractory. unable to cardura in past due to hypotension  . Edema   . PAD (peripheral artery disease)   . Cerebrovascular disease, unspecified   . HLD (hyperlipidemia)   . Bradycardia   . Diabetes mellitus type II   . Hyperkalemia   . Shortness of breath   . Arthritis     " IN MY HANDS "  . Chronic kidney disease, stage III (moderate)     ROS: Negative except as per HPI  BP 202/48  Pulse 55  Ht 5' 10.5" (1.791 m)  Wt 188 lb 9.6 oz (85.548  kg)  BMI 26.67 kg/m2  PHYSICAL EXAM: Pt is alert and oriented, NAD HEENT: normal Neck: JVP - normal, carotids 2+= with bilateral bruits Lungs: CTA bilaterally CV: Bradycardic and regular with a soft systolic murmur over the left sternal border Abd: soft, NT, Positive BS, no hepatomegaly Ext: Femoral pulses are 1+ on the right side and +2 on the left side . Pedal pulses are nonpalpable bilaterally. There is 1+ edema of the right foot. Skin: There is mild erythema of the right foot with brawny edema and an area of gangrene over the distal tip of the second toe. There is granulation tissue around the.  ASSESSMENT AND PLAN: 1. 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 The gangrene on the second toe is still there but seems to be healing slowly.  There is also subtotal occlusion of the right SFA and an occluded right popliteal artery. Treated and has SFA and occluded right popliteal artery definitely would help perfusion pressure to his foot. However, this is going to be at significant risk due to his severely calcified vessels, the need for antegrade access via the right femoral artery, chronic kidney disease and severely uncontrolled hypertension. Due to all of that, I recommend close observation for now. I will plan on repeat aortoiliac duplex and lower extremity Doppler in few months. If the toe gangrene does not heal, I will proceed with the above revascularization.   2. hypertension: Blood pressure continues to be very elevated. I recommend increasing the dose of hydralazine to 50 mg 3 times daily   Kathlyn Sacramento

## 2013-06-13 ENCOUNTER — Encounter (INDEPENDENT_AMBULATORY_CARE_PROVIDER_SITE_OTHER): Payer: Medicare Other | Admitting: Ophthalmology

## 2013-06-13 DIAGNOSIS — I10 Essential (primary) hypertension: Secondary | ICD-10-CM

## 2013-06-13 DIAGNOSIS — H43819 Vitreous degeneration, unspecified eye: Secondary | ICD-10-CM

## 2013-06-13 DIAGNOSIS — H35039 Hypertensive retinopathy, unspecified eye: Secondary | ICD-10-CM

## 2013-06-13 DIAGNOSIS — H353290 Exudative age-related macular degeneration, unspecified eye, stage unspecified: Secondary | ICD-10-CM

## 2013-07-29 ENCOUNTER — Encounter (HOSPITAL_COMMUNITY): Payer: Self-pay

## 2013-07-29 ENCOUNTER — Other Ambulatory Visit (HOSPITAL_COMMUNITY): Payer: Self-pay | Admitting: Cardiology

## 2013-07-29 DIAGNOSIS — I739 Peripheral vascular disease, unspecified: Secondary | ICD-10-CM

## 2013-07-31 ENCOUNTER — Encounter (INDEPENDENT_AMBULATORY_CARE_PROVIDER_SITE_OTHER): Payer: Medicare Other | Admitting: Ophthalmology

## 2013-07-31 DIAGNOSIS — H35039 Hypertensive retinopathy, unspecified eye: Secondary | ICD-10-CM

## 2013-07-31 DIAGNOSIS — H43819 Vitreous degeneration, unspecified eye: Secondary | ICD-10-CM

## 2013-07-31 DIAGNOSIS — H353290 Exudative age-related macular degeneration, unspecified eye, stage unspecified: Secondary | ICD-10-CM

## 2013-07-31 DIAGNOSIS — I10 Essential (primary) hypertension: Secondary | ICD-10-CM

## 2013-08-04 ENCOUNTER — Ambulatory Visit (HOSPITAL_BASED_OUTPATIENT_CLINIC_OR_DEPARTMENT_OTHER): Payer: Medicare Other | Admitting: Cardiology

## 2013-08-04 ENCOUNTER — Ambulatory Visit (HOSPITAL_COMMUNITY): Payer: Medicare Other | Attending: Cardiology | Admitting: Cardiology

## 2013-08-04 DIAGNOSIS — I739 Peripheral vascular disease, unspecified: Secondary | ICD-10-CM

## 2013-08-04 DIAGNOSIS — I714 Abdominal aortic aneurysm, without rupture, unspecified: Secondary | ICD-10-CM | POA: Insufficient documentation

## 2013-08-04 DIAGNOSIS — I10 Essential (primary) hypertension: Secondary | ICD-10-CM | POA: Insufficient documentation

## 2013-08-04 DIAGNOSIS — L98499 Non-pressure chronic ulcer of skin of other sites with unspecified severity: Secondary | ICD-10-CM

## 2013-08-04 DIAGNOSIS — I700 Atherosclerosis of aorta: Secondary | ICD-10-CM

## 2013-08-04 NOTE — Progress Notes (Signed)
ABI performed  

## 2013-08-04 NOTE — Progress Notes (Signed)
Duplex Aorta performed

## 2013-08-08 ENCOUNTER — Other Ambulatory Visit: Payer: Self-pay | Admitting: *Deleted

## 2013-08-15 ENCOUNTER — Other Ambulatory Visit: Payer: Self-pay

## 2013-08-15 ENCOUNTER — Ambulatory Visit (HOSPITAL_COMMUNITY)
Admission: RE | Admit: 2013-08-15 | Discharge: 2013-08-15 | Disposition: A | Discharge status: Home / Self Care | Payer: Medicare Other | Source: Ambulatory Visit | Attending: Internal Medicine | Admitting: Internal Medicine

## 2013-08-15 VITALS — BP 184/48 | HR 53 | Wt 188.5 lb

## 2013-08-15 DIAGNOSIS — I10 Essential (primary) hypertension: Secondary | ICD-10-CM

## 2013-08-15 DIAGNOSIS — I4510 Unspecified right bundle-branch block: Secondary | ICD-10-CM | POA: Insufficient documentation

## 2013-08-15 DIAGNOSIS — N189 Chronic kidney disease, unspecified: Secondary | ICD-10-CM | POA: Insufficient documentation

## 2013-08-15 DIAGNOSIS — R609 Edema, unspecified: Secondary | ICD-10-CM

## 2013-08-15 DIAGNOSIS — N183 Chronic kidney disease, stage 3 unspecified: Secondary | ICD-10-CM

## 2013-08-15 DIAGNOSIS — I5032 Chronic diastolic (congestive) heart failure: Secondary | ICD-10-CM | POA: Insufficient documentation

## 2013-08-15 DIAGNOSIS — E785 Hyperlipidemia, unspecified: Secondary | ICD-10-CM | POA: Insufficient documentation

## 2013-08-15 DIAGNOSIS — I6529 Occlusion and stenosis of unspecified carotid artery: Secondary | ICD-10-CM

## 2013-08-15 DIAGNOSIS — I739 Peripheral vascular disease, unspecified: Secondary | ICD-10-CM

## 2013-08-15 DIAGNOSIS — I509 Heart failure, unspecified: Secondary | ICD-10-CM | POA: Insufficient documentation

## 2013-08-15 DIAGNOSIS — I498 Other specified cardiac arrhythmias: Secondary | ICD-10-CM | POA: Insufficient documentation

## 2013-08-15 DIAGNOSIS — I129 Hypertensive chronic kidney disease with stage 1 through stage 4 chronic kidney disease, or unspecified chronic kidney disease: Secondary | ICD-10-CM | POA: Insufficient documentation

## 2013-08-15 LAB — LIPID PANEL
CHOL/HDL RATIO: 5.2 ratio
Cholesterol: 160 mg/dL (ref 0–200)
HDL: 31 mg/dL — ABNORMAL LOW (ref >39)
LDL CALC: 91 mg/dL (ref 0–99)
TRIGLYCERIDES: 190 mg/dL — AB (ref <150)
VLDL: 38 mg/dL (ref 0–40)

## 2013-08-15 LAB — BASIC METABOLIC PANEL
BUN: 50 mg/dL — AB (ref 6–23)
CO2: 27 meq/L (ref 19–32)
Calcium: 8.8 mg/dL (ref 8.4–10.5)
Chloride: 99 mEq/L (ref 96–112)
Creatinine, Ser: 2.4 mg/dL — ABNORMAL HIGH (ref 0.50–1.35)
GFR calc Af Amer: 28 mL/min — ABNORMAL LOW (ref >90)
GFR calc non Af Amer: 24 mL/min — ABNORMAL LOW (ref >90)
GLUCOSE: 102 mg/dL — AB (ref 70–99)
Potassium: 5.4 mEq/L — ABNORMAL HIGH (ref 3.7–5.3)
SODIUM: 139 meq/L (ref 137–147)

## 2013-08-15 LAB — PRO B NATRIURETIC PEPTIDE: Pro B Natriuretic peptide (BNP): 223.8 pg/mL (ref 0–450)

## 2013-08-15 MED ORDER — HYDRALAZINE HCL 50 MG PO TABS: 75.0000 mg | ORAL_TABLET | Freq: Three times a day (TID) | ORAL | Status: DC

## 2013-08-15 NOTE — Patient Instructions (Signed)
Increase Hydralazine to 75 mg (1 & 1/2 tabs) Three times a day   Labs today  We will contact you in 6 months to schedule your next appointment.

## 2013-08-16 NOTE — Progress Notes (Signed)
Patient ID: [name], male   DOB: 1929-09-12, 80 y.o.   MRN: [id number]  PCP: Dr. Forde Dandy. Vascular: Dr. Fletcher Anon  HPI:  [name] is a 80 y.o. male with a history of  1) severe hypertension    --CT angiogram 2009 has been negative for renal artery stenosis.    --Rx limited by bradycardia , hyperkalemia and inability to tolerate several antihypertensives most recently cardura which caused symptomatic hypotension. 2) hyperlipidemia 3) diabetes c/b peripheral neuropathy 4) right bundle-branch block with chronic bradycardia  5) peripheral vascular disease: Status post right carotid endarterectomy and bilateral common iliac stenting.  Last angiogram showed subtotal occlusion right SFA and occlusion right popliteal.  6) chronic LE edema due to venous insufficiency.   7) CVA 9/13 8) chronic renal failure    (last cr 1.88) in 12/13)      -- acute kidney failure after he received dye and required short term hemodialysis in 9/13  04/2010: Lexiscan Myoview demonstrated normal perfusion.  04/2012 ABIs 0.58 on the right and 0.56 on the left.  Carotid US (6/14) RICA (S/p CEA) 99-24% LICA 26-83% Renal artery dopplers (9/14) with no obvious renal artery stenosis.   He returns for follow up. BP remains high.  Weight is stable. No beta blocker due to bradycardia, no ACEI/ARB due to CKD.  Main limitation at this point is claudication.  He has right > left calf pain after walking 30-40 yards on flat ground or walking uphill.  Last peripheral angiogram showed subtotal occlusion right SFA and occluded right popliteal.  No significant exertional dyspnea.  No chest pain.   ROS: All systems negative except as listed in HPI, PMH and Problem List.  Past Medical History  Diagnosis Date  . Carotid stenosis     u/s 5/11: R 0-39% L 40-59% (stable)  . HTN (hypertension)     refractory. unable to cardura in past due to hypotension  . Edema   . PAD (peripheral artery disease)   . Cerebrovascular disease, unspecified    . HLD (hyperlipidemia)   . Bradycardia   . Diabetes mellitus type II   . Hyperkalemia   . Shortness of breath   . Arthritis     " IN MY HANDS "  . Chronic kidney disease, stage III (moderate)    PHYSICAL EXAM: Filed Vitals:   08/15/13 0900  BP: 184/48  Pulse: 53  Weight: 188 lb 8 oz (85.503 kg)  SpO2: 97%   General:  Well appearing. No resp difficulty HEENT: normal except for L subconjunctival hemorrhage Neck: R scar. normal carotid upstrokes without bruits, JVP  5-6 Lungs: CTA CV: RRR . 2/6 SEM RSB.  Unable to palpate pedal pulses.  Abd: soft, NT. ND no organomegaly Ext: no clubbing, cyanosis, 1+ edema 1/3 up lower legs bilaterally.  Skin: warm and dry without rash  ASSESSMENT & PLAN: 1. HTN, uncontrolled 2. PAD 3. CKD: Stable.  4. Chronic diastolic HF/lower extremity edema 5. Trifascicular block - RBBB, LAFB, 1AVB  Volume status looks good on Lasix 40 mg daily.  BP continues to run high.  I will increase hydralazine to 75 mg tid.  He is due for carotid dopplers, will arrange.   Dr. Fletcher Anon to continue to manage PAD issues.  He is primarily limited by claudication.  Will check BMET/BNP today.  He should be on a statin, he is not sure why he is not taking one.  I will arrange for lipid check to determine which statin and what dose to  use.   [name] 08/16/2013

## 2013-08-18 ENCOUNTER — Telehealth (HOSPITAL_COMMUNITY): Payer: Self-pay | Admitting: Cardiology

## 2013-08-18 DIAGNOSIS — I509 Heart failure, unspecified: Secondary | ICD-10-CM

## 2013-08-18 MED ORDER — FUROSEMIDE 20 MG PO TABS: 20.0000 mg | ORAL_TABLET | Freq: Every day | ORAL | Status: DC

## 2013-08-18 NOTE — Telephone Encounter (Signed)
Message copied by JEFFRIES, Sharlot Gowda on Mon Aug 18, 2013  4:58 PM ------      Message from: Larey Dresser      Created: Fri Aug 15, 2013 12:48 PM       K and creatinine higher, would stop any K supplementation, low K diet, cut back Lasix to 20 daily. ------

## 2013-08-20 NOTE — Progress Notes (Signed)
What is this?

## 2013-09-02 ENCOUNTER — Ambulatory Visit (INDEPENDENT_AMBULATORY_CARE_PROVIDER_SITE_OTHER): Payer: Medicare Other | Admitting: Cardiovascular Disease

## 2013-09-02 ENCOUNTER — Encounter: Payer: Self-pay | Admitting: Cardiovascular Disease

## 2013-09-02 VITALS — BP 146/70 | HR 91 | Ht 70.5 in | Wt 187.1 lb

## 2013-09-02 DIAGNOSIS — I6529 Occlusion and stenosis of unspecified carotid artery: Secondary | ICD-10-CM

## 2013-09-02 DIAGNOSIS — I739 Peripheral vascular disease, unspecified: Secondary | ICD-10-CM

## 2013-09-02 NOTE — Progress Notes (Signed)
HPI:  80 year old gentleman presenting for followup evaluation. The patient is followed for peripheral arterial disease. He has undergone bilateral iliac stenting in 2004 for severe intermittent claudication. He has been followed since that time with serial duplex scans. His last ABI in December 2013 showed ABIs of 0.58 on the right and 0.56 on the left. He then developed some acute worsening of right leg and foot claudication with minimal walking. He developed an ulcer on the right second toe after breaking the skin with a small gangrenous area. He underwent  bilateral common iliac artery kissing stent placement extending into the distal aorta in 05/2013. The procedure was extremely difficult due to severe calcification. Post procedure ABI on the right side was 0.38. Duplex showed possible right external iliac artery stenosis with a peak velocity slightly above 400. Thus, I proceeded with repeat angiography which showed patent stents in the distal aorta extending into bilateral common iliac arteries. There was moderate stenosis involving the right external iliac artery with a peak gradient of less than 10 mm mercury at rest. Thus, no intervention was performed. The The ulceration on the toe is completely healed. He does have subtotal occlusion of R distal SFA/popliteal artery.  He has good days and bad days in terms of calf and foot pain. He continues to play golf. No rest pain.     Outpatient Encounter Prescriptions as of 09/02/2013  Medication Sig  . amLODipine (NORVASC) 10 MG tablet Take 1 tablet (10 mg total) by mouth daily.  [name] aspirin 81 MG tablet Take 1 tablet (81 mg total) by mouth daily.  . cloNIDine (CATAPRES) 0.3 MG tablet Take 2 tablets (0.6 mg total) by mouth 2 (two) times daily.  . clopidogrel (PLAVIX) 75 MG tablet Take 75 mg by mouth daily.   . fenofibrate 160 MG tablet Take 160 mg by mouth daily.    . folic acid (FOLVITE) 1 MG tablet Take 1 mg by mouth 2 (two) times daily.   .  furosemide (LASIX) 20 MG tablet Take 1 tablet (20 mg total) by mouth daily.  . hydrALAZINE (APRESOLINE) 50 MG tablet Take 1.5 tablets (75 mg total) by mouth 3 (three) times daily.  . Insulin Lispro Prot & Lispro (HUMALOG MIX 50/50 PEN [place]) Inject 40 Units into the skin 2 (two) times daily. 40 units in the am 40 units in the pm  . pregabalin (LYRICA) 75 MG capsule Take 75 mg by mouth 3 (three) times daily.     Allergies  Allergen Reactions  . Aliskiren Fumarate     REACTION: Reaction not known  . Doxazosin Mesylate   . Metformin And Related     Past Medical History  Diagnosis Date  . Carotid stenosis     u/s 5/11: R 0-39% L 40-59% (stable)  . HTN (hypertension)     refractory. unable to cardura in past due to hypotension  . Edema   . PAD (peripheral artery disease)   . Cerebrovascular disease, unspecified   . HLD (hyperlipidemia)   . Bradycardia   . Diabetes mellitus type II   . Hyperkalemia   . Shortness of breath   . Arthritis     " IN MY HANDS "  . Chronic kidney disease, stage III (moderate)     ROS: Negative except as per HPI  There were no vitals taken for this visit.  PHYSICAL EXAM: Pt is alert and oriented, NAD HEENT: normal Neck: JVP - normal, carotids 2+= with bilateral bruits  Lungs: CTA bilaterally CV: Bradycardic and regular with a soft systolic murmur over the left sternal border Abd: soft, NT, Positive BS, no hepatomegaly Ext: Femoral pulses are 1+ on the right side and +2 on the left side . Pedal pulses are nonpalpable bilaterally. There is 1+ edema of the right foot. Skin: There is mild erythema of the right foot with brawny edema . The wound on right second toe is healed.   ASSESSMENT AND PLAN: 1. Lower extremity peripheral arterial disease with healed gangrene: post iliac stenting.  There is also subtotal occlusion of the right SFA and and occluded right popliteal artery. Endovascular treatment of SFA and occluded right popliteal artery definitely  would help perfusion pressure to his foot. However, this is going to be at significant risk due to his severely calcified vessels, the need for antegrade access via the right femoral artery, chronic kidney disease and severely uncontrolled hypertension. Renal function recently worsened. Reserve revascularization for critical limb ischemia.  Follow up in 3 months.    2. hypertension: Blood pressure is more controlled.   Wellington [place]

## 2013-09-02 NOTE — Patient Instructions (Signed)
Your physician recommends that you schedule a follow-up appointment in: 3 MONTHS with Dr Arida  Your physician recommends that you continue on your current medications as directed. Please refer to the Current Medication list given to you today.  

## 2013-09-03 ENCOUNTER — Encounter: Payer: Self-pay | Admitting: Internal Medicine

## 2013-09-03 ENCOUNTER — Ambulatory Visit (HOSPITAL_COMMUNITY): Payer: Medicare Other | Attending: Internal Medicine | Admitting: *Deleted

## 2013-09-03 DIAGNOSIS — I6529 Occlusion and stenosis of unspecified carotid artery: Secondary | ICD-10-CM

## 2013-09-03 NOTE — Progress Notes (Signed)
Carotid duplex complete 

## 2013-09-25 ENCOUNTER — Encounter (INDEPENDENT_AMBULATORY_CARE_PROVIDER_SITE_OTHER): Payer: Medicare Other | Admitting: Ophthalmology

## 2013-09-25 DIAGNOSIS — E1165 Type 2 diabetes mellitus with hyperglycemia: Secondary | ICD-10-CM

## 2013-09-25 DIAGNOSIS — E1139 Type 2 diabetes mellitus with other diabetic ophthalmic complication: Secondary | ICD-10-CM

## 2013-09-25 DIAGNOSIS — H35039 Hypertensive retinopathy, unspecified eye: Secondary | ICD-10-CM

## 2013-09-25 DIAGNOSIS — H353290 Exudative age-related macular degeneration, unspecified eye, stage unspecified: Secondary | ICD-10-CM

## 2013-09-25 DIAGNOSIS — H3530 Unspecified macular degeneration: Secondary | ICD-10-CM

## 2013-09-25 DIAGNOSIS — E11319 Type 2 diabetes mellitus with unspecified diabetic retinopathy without macular edema: Secondary | ICD-10-CM

## 2013-09-25 DIAGNOSIS — I10 Essential (primary) hypertension: Secondary | ICD-10-CM

## 2013-09-25 DIAGNOSIS — H43819 Vitreous degeneration, unspecified eye: Secondary | ICD-10-CM

## 2013-10-01 ENCOUNTER — Ambulatory Visit (INDEPENDENT_AMBULATORY_CARE_PROVIDER_SITE_OTHER): Payer: Medicare Other | Admitting: Cardiovascular Disease

## 2013-10-01 ENCOUNTER — Encounter: Payer: Self-pay | Admitting: Cardiovascular Disease

## 2013-10-01 VITALS — BP 145/62 | HR 51 | Ht 70.5 in | Wt 189.0 lb

## 2013-10-01 DIAGNOSIS — I70219 Atherosclerosis of native arteries of extremities with intermittent claudication, unspecified extremity: Secondary | ICD-10-CM

## 2013-10-01 DIAGNOSIS — E785 Hyperlipidemia, unspecified: Secondary | ICD-10-CM

## 2013-10-01 DIAGNOSIS — I6529 Occlusion and stenosis of unspecified carotid artery: Secondary | ICD-10-CM

## 2013-10-01 DIAGNOSIS — I10 Essential (primary) hypertension: Secondary | ICD-10-CM

## 2013-10-01 NOTE — Patient Instructions (Signed)
Your physician wants you to follow-up in: 6 MONTHS with Dr Cooper.  You will receive a reminder letter in the mail two months in advance. If you don't receive a letter, please call our office to schedule the follow-up appointment.  Your physician recommends that you continue on your current medications as directed. Please refer to the Current Medication list given to you today.  

## 2013-10-01 NOTE — Progress Notes (Signed)
HPI:  80 year old gentleman presenting for followup evaluation. Cardiac problems include malignant hypertension, extensive PAD, history of stroke, diabetes with peripheral neuropathy, and hyperlipidemia. He also has chronic kidney disease with some worsening in his baseline creatinine over recent months. He's been evaluated for renal artery stenosis and has Doppler studies/CT angiogram had demonstrated no evidence of renal arterial disease.  The patient continues to have bilateral calf tightness with ambulation, right worse than left. His right hip also hurts with walking. He denies chest pain or pressure. Chronic dyspnea with exertion is unchanged. He still plays golf frequently.  Outpatient Encounter Prescriptions as of 10/01/2013  Medication Sig  . amLODipine (NORVASC) 10 MG tablet Take 1 tablet (10 mg total) by mouth daily.  [name] aspirin 81 MG tablet Take 1 tablet (81 mg total) by mouth daily.  . cloNIDine (CATAPRES) 0.3 MG tablet Take 2 tablets (0.6 mg total) by mouth 2 (two) times daily.  . clopidogrel (PLAVIX) 75 MG tablet Take 75 mg by mouth daily.   . fenofibrate 160 MG tablet Take 160 mg by mouth daily.    . folic acid (FOLVITE) 1 MG tablet Take 1 mg by mouth 2 (two) times daily.   . furosemide (LASIX) 20 MG tablet Take 1 tablet (20 mg total) by mouth daily.  . hydrALAZINE (APRESOLINE) 50 MG tablet Take 1.5 tablets (75 mg total) by mouth 3 (three) times daily.  . Insulin Lispro Prot & Lispro (HUMALOG MIX 50/50 PEN [place]) Inject 40 Units into the skin 2 (two) times daily. 40 units in the am 40 units in the pm  . pregabalin (LYRICA) 75 MG capsule Take 75 mg by mouth 3 (three) times daily.     Allergies  Allergen Reactions  . Aliskiren Fumarate     REACTION: Reaction not known  . Doxazosin Mesylate   . Metformin And Related     Past Medical History  Diagnosis Date  . Carotid stenosis     u/s 5/11: R 0-39% L 40-59% (stable)  . HTN (hypertension)     refractory. unable to cardura  in past due to hypotension  . Edema   . PAD (peripheral artery disease)   . Cerebrovascular disease, unspecified   . HLD (hyperlipidemia)   . Bradycardia   . Diabetes mellitus type II   . Hyperkalemia   . Shortness of breath   . Arthritis     " IN MY HANDS "  . Chronic kidney disease, stage III (moderate)     ROS: Negative except as per HPI  BP 145/62  Pulse 51  Ht 5' 10.5" (1.791 m)  Wt 85.73 kg (189 lb)  BMI 26.73 kg/m2  PHYSICAL EXAM: Pt is alert and oriented, NAD HEENT: normal Neck: JVP - normal, carotids 2+= with bilateral bruits Lungs: CTA bilaterally CV: Bradycardic and regular without murmur or gallop Abd: soft, NT, Positive BS, no hepatomegaly Ext: Mild pretibial edema bilaterally, nonpalpable pedal pulses. Skin: Diffuse ecchymoses on both arms  EKG:   Sinus bradycardia with first degree AV block, heart rate 51 beats per minute, right bundle branch block with left axis deviation.  ASSESSMENT AND PLAN: 1. Malignant hypertension. Blood pressure control is better on higher doses of clonidine and hydralazine. We'll continue same therapy.  2. Lower extremity peripheral arterial disease with intermittent claudication. Continue dual antiplatelet therapy. He is seen closely by Dr. Fletcher Anon who has done his most recent PV interventions.  3. Chronic kidney disease now with GFR less than 30.  I  will see him back in 6 months. He continues to follow closely with Dr. Forde Dandy.  Sherren Mocha 10/01/2013 2:26 PM

## 2013-11-06 ENCOUNTER — Encounter (INDEPENDENT_AMBULATORY_CARE_PROVIDER_SITE_OTHER): Payer: Medicare Other | Admitting: Ophthalmology

## 2013-11-06 DIAGNOSIS — H353290 Exudative age-related macular degeneration, unspecified eye, stage unspecified: Secondary | ICD-10-CM

## 2013-11-06 DIAGNOSIS — E11319 Type 2 diabetes mellitus with unspecified diabetic retinopathy without macular edema: Secondary | ICD-10-CM

## 2013-11-06 DIAGNOSIS — E1139 Type 2 diabetes mellitus with other diabetic ophthalmic complication: Secondary | ICD-10-CM

## 2013-11-06 DIAGNOSIS — H35039 Hypertensive retinopathy, unspecified eye: Secondary | ICD-10-CM

## 2013-11-06 DIAGNOSIS — E1165 Type 2 diabetes mellitus with hyperglycemia: Secondary | ICD-10-CM

## 2013-11-06 DIAGNOSIS — I10 Essential (primary) hypertension: Secondary | ICD-10-CM

## 2013-11-06 DIAGNOSIS — H43819 Vitreous degeneration, unspecified eye: Secondary | ICD-10-CM

## 2013-11-28 ENCOUNTER — Other Ambulatory Visit (HOSPITAL_COMMUNITY): Payer: Self-pay | Admitting: Internal Medicine

## 2013-12-02 ENCOUNTER — Ambulatory Visit (INDEPENDENT_AMBULATORY_CARE_PROVIDER_SITE_OTHER): Payer: Medicare Other | Admitting: Cardiovascular Disease

## 2013-12-02 ENCOUNTER — Encounter: Payer: Self-pay | Admitting: Cardiovascular Disease

## 2013-12-02 ENCOUNTER — Other Ambulatory Visit (HOSPITAL_COMMUNITY): Payer: Self-pay | Admitting: *Deleted

## 2013-12-02 VITALS — BP 182/79 | HR 45 | Ht 70.5 in | Wt 189.0 lb

## 2013-12-02 DIAGNOSIS — I739 Peripheral vascular disease, unspecified: Secondary | ICD-10-CM

## 2013-12-02 DIAGNOSIS — I6529 Occlusion and stenosis of unspecified carotid artery: Secondary | ICD-10-CM

## 2013-12-02 NOTE — Progress Notes (Signed)
HPI:  80 year old gentleman presenting for followup evaluation. The patient is followed for peripheral arterial disease. He has undergone bilateral iliac stenting in 2004 for severe intermittent claudication. He has been followed since that time with serial duplex scans. His last ABI in December 2013 showed ABIs of 0.58 on the right and 0.56 on the left. He then developed some acute worsening of right leg and foot claudication with minimal walking. He developed an ulcer on the right second toe after breaking the skin with a small gangrenous area. He underwent  bilateral common iliac artery kissing stent placement extending into the distal aorta in 05/2013. The procedure was extremely difficult due to severe calcification. Post procedure ABI on the right side was 0.38. Duplex showed possible right external iliac artery stenosis with a peak velocity slightly above 400. Thus, I proceeded with repeat angiography which showed patent stents in the distal aorta extending into bilateral common iliac arteries. There was moderate stenosis involving the right external iliac artery with a peak gradient of less than 10 mm mercury at rest. Thus, no intervention was performed. The The ulceration on the toe is completely healed. He does have subtotal occlusion of R distal SFA/popliteal artery.  He continues to complain of bilateral calf discomfort with walking which is mostly noticeable on the days when he plays golf. He is able to do activities of daily living reasonably well. He denies any rest pain. He did remove a corn from the left small toe. There is a small ulceration there is healing spontaneously.    Outpatient Encounter Prescriptions as of 12/02/2013  Medication Sig  . amLODipine (NORVASC) 10 MG tablet Take 1 tablet (10 mg total) by mouth daily.  [name] aspirin 81 MG tablet Take 1 tablet (81 mg total) by mouth daily.  . cloNIDine (CATAPRES) 0.3 MG tablet TAKE 2 TABLETS (0.6 MG) TWICE A DAY  . clopidogrel  (PLAVIX) 75 MG tablet Take 75 mg by mouth daily.   . fenofibrate 160 MG tablet Take 160 mg by mouth daily.    . folic acid (FOLVITE) 1 MG tablet Take 1 mg by mouth 2 (two) times daily.   . furosemide (LASIX) 20 MG tablet Take 1 tablet (20 mg total) by mouth daily.  . hydrALAZINE (APRESOLINE) 50 MG tablet Take 1.5 tablets (75 mg total) by mouth 3 (three) times daily.  . Insulin Lispro Prot & Lispro (HUMALOG MIX 50/50 PEN [place]) Inject 35 Units into the skin 2 (two) times daily. 40 units in the am 40 units in the pm  . pregabalin (LYRICA) 75 MG capsule Take 75 mg by mouth 3 (three) times daily.   . [DISCONTINUED] cloNIDine (CATAPRES) 0.3 MG tablet Take 2 tablets (0.6 mg total) by mouth 2 (two) times daily.    Allergies  Allergen Reactions  . Aliskiren Fumarate     REACTION: Reaction not known  . Doxazosin Mesylate   . Metformin And Related     Past Medical History  Diagnosis Date  . Carotid stenosis     u/s 5/11: R 0-39% L 40-59% (stable)  . HTN (hypertension)     refractory. unable to cardura in past due to hypotension  . Edema   . PAD (peripheral artery disease)   . Cerebrovascular disease, unspecified   . HLD (hyperlipidemia)   . Bradycardia   . Diabetes mellitus type II   . Hyperkalemia   . Shortness of breath   . Arthritis     " IN MY  HANDS "  . Chronic kidney disease, stage III (moderate)     ROS: Negative except as per HPI  BP 182/79  Pulse 45  Ht 5' 10.5" (1.791 m)  Wt 189 lb (85.73 kg)  BMI 26.73 kg/m2  SpO2 99%  PHYSICAL EXAM: Pt is alert and oriented, NAD HEENT: normal Neck: JVP - normal, carotids 2+= with bilateral bruits Lungs: CTA bilaterally CV: Bradycardic and regular with a soft systolic murmur over the left sternal border Abd: soft, NT, Positive BS, no hepatomegaly Ext: Femoral pulses are 1+ on the right side and +2 on the left side . Pedal pulses are nonpalpable bilaterally. There is 1+ edema of the right foot. Skin: There is mild erythema of the  right foot with brawny edema . The wound on right second toe is healed. Small ulceration on the left small toe at the site of corn removal. Seems to be healing.  ASSESSMENT AND PLAN: 1. Lower extremity peripheral arterial disease : post bilateral iliac stenting. He now has bilateral severe calf claudication. There is also subtotal occlusion of the right SFA and and occluded right popliteal artery.  Endovascular intervention is limited by severely calcified vessels, the need for antegrade access via the right femoral artery, chronic kidney disease and severely uncontrolled hypertension. Most recent creatinine was 2.4. Reserve revascularization for critical limb ischemia.  I requested a followup aortoiliac duplex.   2. hypertension: Blood pressure is chronically elevated likely due to severely calcified vessels appeared.   Kathlyn [place]

## 2013-12-02 NOTE — Patient Instructions (Signed)
Your physician has requested that you have an ankle brachial index (ABI). During this test an ultrasound and blood pressure cuff are used to evaluate the arteries that supply the arms and legs with blood. Allow thirty minutes for this exam. There are no restrictions or special instructions.  Your physician has requested that you have an aorto-iliac duplex. During this test, an ultrasound is used to evaluate the aorto-iliac. Allow 30 minutes for this exam. Do not eat after midnight the day before and avoid carbonated beverages  Your physician recommends that you schedule a follow-up appointment in: 3 months with Dr. Fletcher Anon.

## 2013-12-10 ENCOUNTER — Encounter (HOSPITAL_COMMUNITY): Payer: Medicare Other

## 2013-12-18 ENCOUNTER — Encounter (HOSPITAL_COMMUNITY): Payer: Medicare Other

## 2013-12-18 ENCOUNTER — Encounter (INDEPENDENT_AMBULATORY_CARE_PROVIDER_SITE_OTHER): Payer: Medicare Other | Admitting: Ophthalmology

## 2013-12-18 DIAGNOSIS — E1165 Type 2 diabetes mellitus with hyperglycemia: Secondary | ICD-10-CM

## 2013-12-18 DIAGNOSIS — H35039 Hypertensive retinopathy, unspecified eye: Secondary | ICD-10-CM

## 2013-12-18 DIAGNOSIS — E11319 Type 2 diabetes mellitus with unspecified diabetic retinopathy without macular edema: Secondary | ICD-10-CM

## 2013-12-18 DIAGNOSIS — H353290 Exudative age-related macular degeneration, unspecified eye, stage unspecified: Secondary | ICD-10-CM

## 2013-12-18 DIAGNOSIS — I10 Essential (primary) hypertension: Secondary | ICD-10-CM

## 2013-12-18 DIAGNOSIS — H43819 Vitreous degeneration, unspecified eye: Secondary | ICD-10-CM

## 2013-12-18 DIAGNOSIS — H3530 Unspecified macular degeneration: Secondary | ICD-10-CM

## 2013-12-18 DIAGNOSIS — E1139 Type 2 diabetes mellitus with other diabetic ophthalmic complication: Secondary | ICD-10-CM

## 2013-12-24 ENCOUNTER — Ambulatory Visit (HOSPITAL_BASED_OUTPATIENT_CLINIC_OR_DEPARTMENT_OTHER): Payer: Medicare Other | Admitting: *Deleted

## 2013-12-24 ENCOUNTER — Ambulatory Visit (HOSPITAL_COMMUNITY): Payer: Medicare Other | Attending: Cardiovascular Disease | Admitting: *Deleted

## 2013-12-24 DIAGNOSIS — I714 Abdominal aortic aneurysm, without rupture, unspecified: Secondary | ICD-10-CM | POA: Diagnosis present

## 2013-12-24 DIAGNOSIS — T82898A Other specified complication of vascular prosthetic devices, implants and grafts, initial encounter: Secondary | ICD-10-CM | POA: Insufficient documentation

## 2013-12-24 DIAGNOSIS — I739 Peripheral vascular disease, unspecified: Secondary | ICD-10-CM

## 2013-12-24 DIAGNOSIS — Z48812 Encounter for surgical aftercare following surgery on the circulatory system: Secondary | ICD-10-CM | POA: Insufficient documentation

## 2013-12-24 DIAGNOSIS — I700 Atherosclerosis of aorta: Secondary | ICD-10-CM

## 2013-12-24 NOTE — Progress Notes (Signed)
Abdominal Aorta Duplex Complete 

## 2013-12-24 NOTE — Progress Notes (Signed)
ABI Complete 

## 2014-01-29 ENCOUNTER — Encounter (INDEPENDENT_AMBULATORY_CARE_PROVIDER_SITE_OTHER): Payer: Medicare Other | Admitting: Ophthalmology

## 2014-01-29 DIAGNOSIS — H353290 Exudative age-related macular degeneration, unspecified eye, stage unspecified: Secondary | ICD-10-CM

## 2014-01-29 DIAGNOSIS — E1165 Type 2 diabetes mellitus with hyperglycemia: Secondary | ICD-10-CM

## 2014-01-29 DIAGNOSIS — H3530 Unspecified macular degeneration: Secondary | ICD-10-CM

## 2014-01-29 DIAGNOSIS — E1139 Type 2 diabetes mellitus with other diabetic ophthalmic complication: Secondary | ICD-10-CM

## 2014-01-29 DIAGNOSIS — I10 Essential (primary) hypertension: Secondary | ICD-10-CM

## 2014-01-29 DIAGNOSIS — H35039 Hypertensive retinopathy, unspecified eye: Secondary | ICD-10-CM

## 2014-01-29 DIAGNOSIS — E11319 Type 2 diabetes mellitus with unspecified diabetic retinopathy without macular edema: Secondary | ICD-10-CM

## 2014-01-30 ENCOUNTER — Emergency Department (HOSPITAL_COMMUNITY): Payer: Medicare Other

## 2014-01-30 ENCOUNTER — Encounter (HOSPITAL_COMMUNITY): Payer: Self-pay | Admitting: Emergency Medicine

## 2014-01-30 ENCOUNTER — Inpatient Hospital Stay (HOSPITAL_COMMUNITY)
Admission: EM | Admit: 2014-01-30 | Discharge: 2014-02-01 | DRG: 375 | Disposition: A | Discharge status: Home / Self Care | Payer: Medicare Other | Attending: Endocrinology | Admitting: Endocrinology

## 2014-01-30 DIAGNOSIS — Z794 Long term (current) use of insulin: Secondary | ICD-10-CM | POA: Diagnosis not present

## 2014-01-30 DIAGNOSIS — I739 Peripheral vascular disease, unspecified: Secondary | ICD-10-CM | POA: Diagnosis present

## 2014-01-30 DIAGNOSIS — I129 Hypertensive chronic kidney disease with stage 1 through stage 4 chronic kidney disease, or unspecified chronic kidney disease: Secondary | ICD-10-CM | POA: Diagnosis present

## 2014-01-30 DIAGNOSIS — N058 Unspecified nephritic syndrome with other morphologic changes: Secondary | ICD-10-CM | POA: Diagnosis present

## 2014-01-30 DIAGNOSIS — Z9849 Cataract extraction status, unspecified eye: Secondary | ICD-10-CM | POA: Diagnosis not present

## 2014-01-30 DIAGNOSIS — E860 Dehydration: Secondary | ICD-10-CM | POA: Diagnosis present

## 2014-01-30 DIAGNOSIS — K5900 Constipation, unspecified: Secondary | ICD-10-CM | POA: Diagnosis present

## 2014-01-30 DIAGNOSIS — Z7902 Long term (current) use of antithrombotics/antiplatelets: Secondary | ICD-10-CM

## 2014-01-30 DIAGNOSIS — Z7982 Long term (current) use of aspirin: Secondary | ICD-10-CM

## 2014-01-30 DIAGNOSIS — E785 Hyperlipidemia, unspecified: Secondary | ICD-10-CM | POA: Diagnosis present

## 2014-01-30 DIAGNOSIS — R1084 Generalized abdominal pain: Secondary | ICD-10-CM

## 2014-01-30 DIAGNOSIS — E1142 Type 2 diabetes mellitus with diabetic polyneuropathy: Secondary | ICD-10-CM | POA: Diagnosis present

## 2014-01-30 DIAGNOSIS — R1110 Vomiting, unspecified: Secondary | ICD-10-CM

## 2014-01-30 DIAGNOSIS — D649 Anemia, unspecified: Secondary | ICD-10-CM | POA: Diagnosis present

## 2014-01-30 DIAGNOSIS — Z79899 Other long term (current) drug therapy: Secondary | ICD-10-CM | POA: Diagnosis not present

## 2014-01-30 DIAGNOSIS — I2510 Atherosclerotic heart disease of native coronary artery without angina pectoris: Secondary | ICD-10-CM | POA: Diagnosis present

## 2014-01-30 DIAGNOSIS — N183 Chronic kidney disease, stage 3 unspecified: Secondary | ICD-10-CM | POA: Diagnosis present

## 2014-01-30 DIAGNOSIS — E1129 Type 2 diabetes mellitus with other diabetic kidney complication: Secondary | ICD-10-CM | POA: Diagnosis present

## 2014-01-30 DIAGNOSIS — K219 Gastro-esophageal reflux disease without esophagitis: Secondary | ICD-10-CM | POA: Diagnosis present

## 2014-01-30 DIAGNOSIS — C159 Malignant neoplasm of esophagus, unspecified: Secondary | ICD-10-CM | POA: Diagnosis present

## 2014-01-30 DIAGNOSIS — I4510 Unspecified right bundle-branch block: Secondary | ICD-10-CM | POA: Diagnosis present

## 2014-01-30 DIAGNOSIS — Z23 Encounter for immunization: Secondary | ICD-10-CM

## 2014-01-30 DIAGNOSIS — R1310 Dysphagia, unspecified: Secondary | ICD-10-CM

## 2014-01-30 DIAGNOSIS — E1149 Type 2 diabetes mellitus with other diabetic neurological complication: Secondary | ICD-10-CM | POA: Diagnosis present

## 2014-01-30 DIAGNOSIS — C7889 Secondary malignant neoplasm of other digestive organs: Secondary | ICD-10-CM | POA: Diagnosis present

## 2014-01-30 DIAGNOSIS — C787 Secondary malignant neoplasm of liver and intrahepatic bile duct: Secondary | ICD-10-CM | POA: Diagnosis present

## 2014-01-30 DIAGNOSIS — C786 Secondary malignant neoplasm of retroperitoneum and peritoneum: Secondary | ICD-10-CM | POA: Diagnosis present

## 2014-01-30 DIAGNOSIS — Z87891 Personal history of nicotine dependence: Secondary | ICD-10-CM

## 2014-01-30 HISTORY — DX: Cerebral infarction, unspecified: I63.9

## 2014-01-30 LAB — CBC WITH DIFFERENTIAL/PLATELET
BASOS ABS: 0 10*3/uL (ref 0.0–0.1)
Basophils Relative: 1 % (ref 0–1)
EOS ABS: 0 10*3/uL (ref 0.0–0.7)
Eosinophils Relative: 1 % (ref 0–5)
HCT: 37.3 % — ABNORMAL LOW (ref 39.0–52.0)
Hemoglobin: 12.1 g/dL — ABNORMAL LOW (ref 13.0–17.0)
Lymphocytes Relative: 16 % (ref 12–46)
Lymphs Abs: 1 10*3/uL (ref 0.7–4.0)
MCH: 28.3 pg (ref 26.0–34.0)
MCHC: 32.4 g/dL (ref 30.0–36.0)
MCV: 87.4 fL (ref 78.0–100.0)
Monocytes Absolute: 0.6 10*3/uL (ref 0.1–1.0)
Monocytes Relative: 8 % (ref 3–12)
NEUTROS PCT: 74 % (ref 43–77)
Neutro Abs: 4.9 10*3/uL (ref 1.7–7.7)
PLATELETS: 182 10*3/uL (ref 150–400)
RBC: 4.27 MIL/uL (ref 4.22–5.81)
RDW: 14.8 % (ref 11.5–15.5)
WBC: 6.5 10*3/uL (ref 4.0–10.5)

## 2014-01-30 LAB — URINALYSIS, ROUTINE W REFLEX MICROSCOPIC
Bilirubin Urine: NEGATIVE
Glucose, UA: NEGATIVE mg/dL
HGB URINE DIPSTICK: NEGATIVE
Ketones, ur: NEGATIVE mg/dL
Leukocytes, UA: NEGATIVE
NITRITE: NEGATIVE
Protein, ur: NEGATIVE mg/dL
Specific Gravity, Urine: 1.003 — ABNORMAL LOW (ref 1.005–1.030)
UROBILINOGEN UA: 0.2 mg/dL (ref 0.0–1.0)
pH: 7 (ref 5.0–8.0)

## 2014-01-30 LAB — GLUCOSE, CAPILLARY: Glucose-Capillary: 111 mg/dL — ABNORMAL HIGH (ref 70–99)

## 2014-01-30 LAB — COMPREHENSIVE METABOLIC PANEL
ALT: 15 U/L (ref 0–53)
ANION GAP: 16 — AB (ref 5–15)
AST: 27 U/L (ref 0–37)
Albumin: 3.9 g/dL (ref 3.5–5.2)
Alkaline Phosphatase: 50 U/L (ref 39–117)
BUN: 39 mg/dL — AB (ref 6–23)
CO2: 26 mEq/L (ref 19–32)
Calcium: 9.4 mg/dL (ref 8.4–10.5)
Chloride: 97 mEq/L (ref 96–112)
Creatinine, Ser: 2.15 mg/dL — ABNORMAL HIGH (ref 0.50–1.35)
GFR calc Af Amer: 31 mL/min — ABNORMAL LOW (ref >90)
GFR calc non Af Amer: 27 mL/min — ABNORMAL LOW (ref >90)
Glucose, Bld: 97 mg/dL (ref 70–99)
Potassium: 4.3 mEq/L (ref 3.7–5.3)
SODIUM: 139 meq/L (ref 137–147)
TOTAL PROTEIN: 7.5 g/dL (ref 6.0–8.3)
Total Bilirubin: 0.4 mg/dL (ref 0.3–1.2)

## 2014-01-30 LAB — LIPASE, BLOOD: Lipase: 35 U/L (ref 11–59)

## 2014-01-30 MED ORDER — CLONIDINE HCL 0.1 MG PO TABS
0.2000 mg | ORAL_TABLET | Freq: Once | ORAL | Status: AC
  Administered 2014-01-30: 0.2 mg via ORAL
  Filled 2014-01-30: qty 2

## 2014-01-30 MED ORDER — IOHEXOL 300 MG/ML  SOLN
50.0000 mL | Freq: Once | INTRAMUSCULAR | Status: AC | PRN
  Administered 2014-01-30: 50 mL via ORAL

## 2014-01-30 MED ORDER — ENOXAPARIN SODIUM 40 MG/0.4ML ~~LOC~~ SOLN: 40.0000 mg | SUBCUTANEOUS | Status: DC

## 2014-01-30 MED ORDER — GLUCAGON HCL RDNA (DIAGNOSTIC) 1 MG IJ SOLR
1.0000 mg | Freq: Once | INTRAMUSCULAR | Status: DC
  Filled 2014-01-30: qty 1

## 2014-01-30 MED ORDER — CLONIDINE HCL 0.3 MG/24HR TD PTWK
0.3000 mg | MEDICATED_PATCH | TRANSDERMAL | Status: DC
Start: 2014-01-30 — End: 2014-02-01
  Administered 2014-01-30: 0.3 mg via TRANSDERMAL
  Filled 2014-01-30: qty 1

## 2014-01-30 MED ORDER — HYDRALAZINE HCL 20 MG/ML IJ SOLN
10.0000 mg | INTRAMUSCULAR | Status: DC | PRN
  Administered 2014-02-01: 10 mg via INTRAVENOUS
  Filled 2014-01-30: qty 1

## 2014-01-30 MED ORDER — PANTOPRAZOLE SODIUM 40 MG IV SOLR
40.0000 mg | Freq: Two times a day (BID) | INTRAVENOUS | Status: DC
  Administered 2014-01-30 – 2014-01-31 (×2): 40 mg via INTRAVENOUS
  Filled 2014-01-30 (×3): qty 40

## 2014-01-30 MED ORDER — INSULIN ASPART 100 UNIT/ML ~~LOC~~ SOLN
0.0000 [IU] | Freq: Three times a day (TID) | SUBCUTANEOUS | Status: DC
  Administered 2014-01-31: 2 [IU] via SUBCUTANEOUS
  Administered 2014-02-01: 1 [IU] via SUBCUTANEOUS

## 2014-01-30 MED ORDER — ONDANSETRON HCL 4 MG/2ML IJ SOLN
4.0000 mg | Freq: Once | INTRAMUSCULAR | Status: AC
  Administered 2014-01-30: 4 mg via INTRAVENOUS
  Filled 2014-01-30: qty 2

## 2014-01-30 MED ORDER — CLOPIDOGREL BISULFATE 75 MG PO TABS
75.0000 mg | ORAL_TABLET | Freq: Every day | ORAL | Status: DC
  Filled 2014-01-30: qty 1

## 2014-01-30 MED ORDER — FLUTICASONE PROPIONATE 50 MCG/ACT NA SUSP
2.0000 | Freq: Every day | NASAL | Status: DC
  Filled 2014-01-30 (×2): qty 16

## 2014-01-30 MED ORDER — HYDRALAZINE HCL 20 MG/ML IJ SOLN
10.0000 mg | Freq: Once | INTRAMUSCULAR | Status: AC
  Administered 2014-01-30: 10 mg via INTRAVENOUS
  Filled 2014-01-30: qty 1

## 2014-01-30 MED ORDER — ENOXAPARIN SODIUM 40 MG/0.4ML ~~LOC~~ SOLN
40.0000 mg | SUBCUTANEOUS | Status: DC
  Administered 2014-01-30: 40 mg via SUBCUTANEOUS
  Filled 2014-01-30 (×2): qty 0.4

## 2014-01-30 MED ORDER — SODIUM CHLORIDE 0.9 % IV BOLUS (SEPSIS)
1000.0000 mL | Freq: Once | INTRAVENOUS | Status: AC
  Administered 2014-01-30: 1000 mL via INTRAVENOUS

## 2014-01-30 MED ORDER — INFLUENZA VAC SPLIT QUAD 0.5 ML IM SUSY
0.5000 mL | PREFILLED_SYRINGE | INTRAMUSCULAR | Status: AC
  Administered 2014-01-31: 0.5 mL via INTRAMUSCULAR
  Filled 2014-01-30 (×2): qty 0.5

## 2014-01-30 MED ORDER — INSULIN ASPART 100 UNIT/ML ~~LOC~~ SOLN: 0.0000 [IU] | Freq: Every day | SUBCUTANEOUS | Status: DC

## 2014-01-30 MED ORDER — ASPIRIN EC 81 MG PO TBEC
162.0000 mg | DELAYED_RELEASE_TABLET | Freq: Every day | ORAL | Status: DC
  Administered 2014-01-31 – 2014-02-01 (×2): 162 mg via ORAL
  Filled 2014-01-30 (×2): qty 2

## 2014-01-30 MED ORDER — SODIUM CHLORIDE 0.9 % IV SOLN
INTRAVENOUS | Status: DC
  Administered 2014-01-30 – 2014-01-31 (×4): via INTRAVENOUS

## 2014-01-30 NOTE — ED Notes (Signed)
Patient spit up a small amount of light red blood in an emesis bag.

## 2014-01-30 NOTE — ED Provider Notes (Signed)
4:00 PM Assumed pt care in sign out from Dr Jeanell Sparrow. 84yM with abdominal pain and progressively worsening dysphagia. This morning vomited breakfast and since then has been unable to tolerate any PO. Not clear cut esophageal etiology. Abdomen perhaps mildly distended and also tender. He points to RUQ but is mildly tender diffusely. Acute abdomen series w/o large stool burden, otherwise unremarkable. Will CT and then possible GI consultation if doesn't reveal obvious explanatory pathology.   Called by radiology. Pt with what appears to be liver mets and clinically most likely esophageal mass versus hiatal hernia. Explains worsening dysphagia. Now to point where unable to drink. Will discuss with GI. Continued IV hydration. Admission.   5:42 PM Discussed with Dr Ardis Hughs, [facility] gastroenterology. Will see pt in consultation.   Ct Abdomen Pelvis Wo Contrast  01/30/2014   CLINICAL DATA:  Dysphagia for 3 months, emesis starting today.  EXAM: CT ABDOMEN AND PELVIS WITHOUT CONTRAST  TECHNIQUE: Multidetector CT imaging of the abdomen and pelvis was performed following the standard protocol without IV contrast.  COMPARISON:  Acute abdominal series January 30, 2014 and CT of the abdomen and pelvis April 21, 2014  FINDINGS: LUNG BASES: Limited view of the lung bases are clear, minimal pleural thickening LEFT greater than RIGHT lung bases. Calcified RIGHT lung base pleural plaque. Heart appears mildly enlarged, included pericardium is unremarkable.  KIDNEYS/BLADDER: Kidneys are orthotopic, demonstrating normal size and morphology. No nephrolithiasis, hydronephrosis; limited assessment for renal masses on this nonenhanced examination. The unopacified ureters are normal in course and caliber. Urinary bladder is partially distended and unremarkable.  SOLID ORGANS: New scattered hepatic hypodensities including 29 mm lesion in RIGHT lobe of the liver, segment 6, axial 16/95. The spleen, pancreas and adrenal glands are  unremarkable for this non-contrast examination. Minimal layering density in the gallbladder.  GASTROINTESTINAL TRACT: New apparent hiatal hernia though, there is associated greater than expected soft tissue fullness. The stomach, small and large bowel are normal in course and caliber without inflammatory changes, the sensitivity may be decreased by lack of enteric contrast. Moderate to large amount of retained large bowel stool. The appendix is not discretely identified, however there are no inflammatory changes in the right lower quadrant. Mild sigmoid diverticulosis.  PERITONEUM/RETROPERITONEUM: Minimal Misty mesentery may be reactive, unchanged. No intraperitoneal free fluid nor free air. Severe calcific atherosclerosis of the aortoiliac vessels with mild infrarenal aortic ectasia to 2.6 cm. Aortoiliac bifurcation stent. No lymphadenopathy by CT size criteria. Subcentimeter lymph nodes the gastro esophageal junction are likely reactive. Internal reproductive organs are unremarkable.  SOFT TISSUES/ OSSEOUS STRUCTURES: Nonsuspicious. Moderate L5-S1 disc degeneration.  IMPRESSION: New hypodensities in the liver, findings are concerning for metastatic disease, additional consideration are abscess, less favored. Recommend contrast-enhanced imaging for further characterization when clinically able.  New apparent small hiatal hernia though, greater than expected soft tissue fullness could reflect esophageal mass. Consider endoscopy.  Moderate to large amount of retained large bowel stool without bowel obstruction.  Cholelithiasis without CT findings of acute cholecystitis.  New findings discussed with and reconfirmed by Dr.Mary Hockey on 01/30/2014 at 5:00 pm.   Electronically Signed   By: Elon Alas   On: 01/30/2014 17:01   Dg Abd Acute W/chest  01/30/2014   CLINICAL DATA:  Vomiting, diffuse abdominal pain.  EXAM: ACUTE ABDOMEN SERIES (ABDOMEN 2 VIEW & CHEST 1 VIEW)  COMPARISON:  04/21/2013 CT.  FINDINGS:  Calcifications along the right hemidiaphragm are stable since prior CT. Linear scarring in the left base. Heart is normal  size. No effusions.  Nonobstructive bowel gas pattern. Moderate to large stool burden throughout the colon. No free air organomegaly. Vascular stents noted in the region of the common iliac vessels.  IMPRESSION: Large stool burden throughout the colon. No evidence of bowel obstruction or free air.  Left basilar scarring. Calcifications along the right hemidiaphragm, stable.   Electronically Signed   By: Rolm Baptise M.D.   On: 01/30/2014 14:21   Virgel Manifold, MD 01/30/14 [phone]

## 2014-01-30 NOTE — ED Notes (Signed)
EDP notified of elevated creatinine. EDP stated to give oral contrast.

## 2014-01-30 NOTE — ED Provider Notes (Signed)
CSN: [id number]     Arrival date & time 01/30/14  1250 History   First MD Initiated Contact with Patient 01/30/14 1328     Chief Complaint  Patient presents with  . Dysphagia  . Emesis     (Consider location/radiation/quality/duration/timing/severity/associated sxs/prior Treatment) HPI 80 y.o. Male complaining of vomiting after eating eggs and bacon this am about 0930.  States he has been vomiting since.  States he can't even take any water in without vomiting it back.  He denies pain, fever, diarrhea.  He denies any similar episodes but states he has had some difficulty swallowing over the past several months.   Past Medical History  Diagnosis Date  . Carotid stenosis     u/s 5/11: R 0-39% L 40-59% (stable)  . HTN (hypertension)     refractory. unable to cardura in past due to hypotension  . Edema   . PAD (peripheral artery disease)   . Cerebrovascular disease, unspecified   . HLD (hyperlipidemia)   . Bradycardia   . Diabetes mellitus type II   . Hyperkalemia   . Shortness of breath   . Arthritis     " IN MY HANDS "  . Chronic kidney disease, stage III (moderate)    Past Surgical History  Procedure Laterality Date  . Carotid endarterectomy  1/01    right  . Balloon angioplasty, artery Bilateral 04/09/2013    LEFT ILIAC & RT COMMON ILIAC     DR ARIDA    . Femoral artery stent  ?   2010    DR COOPER   Family History  Problem Relation Age of Onset  . Coronary artery disease Father    History  Substance Use Topics  . Smoking status: Former Smoker    Quit date: 05/08/1994  . Smokeless tobacco: Never Used  . Alcohol Use: No    Review of Systems  All other systems reviewed and are negative.     Allergies  Aliskiren fumarate; Doxazosin mesylate; and Metformin and related  Home Medications   Prior to Admission medications   Medication Sig Start Date End Date Taking? Authorizing Provider  amLODipine (NORVASC) 10 MG tablet Take 1 tablet (10 mg total) by mouth  daily. 10/15/12  Yes Amy D Clegg, NP  aspirin 81 MG tablet Take 1 tablet (81 mg total) by mouth daily. 04/11/13  Yes Rhonda G Barrett, PA-C  cloNIDine (CATAPRES) 0.3 MG tablet Take 0.6 mg by mouth 2 (two) times daily.   Yes Historical Provider, MD  clopidogrel (PLAVIX) 75 MG tablet Take 75 mg by mouth daily.    Yes Historical Provider, MD  fenofibrate 160 MG tablet Take 160 mg by mouth daily.     Yes Historical Provider, MD  folic acid (FOLVITE) 1 MG tablet Take 1 mg by mouth 2 (two) times daily.    Yes Historical Provider, MD  furosemide (LASIX) 20 MG tablet Take 1 tablet (20 mg total) by mouth daily. 08/18/13  Yes Jolaine Artist, MD  hydrALAZINE (APRESOLINE) 50 MG tablet Take 1.5 tablets (75 mg total) by mouth 3 (three) times daily. 08/15/13 09/19/15 Yes Larey Dresser, MD  Insulin Lispro Prot & Lispro (HUMALOG MIX 50/50 PEN [place]) Inject 30 Units into the skin 2 (two) times daily.    Yes Historical Provider, MD  pregabalin (LYRICA) 75 MG capsule Take 75 mg by mouth 3 (three) times daily.    Yes Historical Provider, MD   BP 219/50  Pulse 62  Temp(Src) 97.9 F (  36.6 C) (Oral)  Resp 22  SpO2 100% Physical Exam  Nursing note and vitals reviewed. Constitutional: He appears well-developed and well-nourished. He appears distressed.  HENT:  Head: Normocephalic and atraumatic.  Right Ear: External ear normal.  Left Ear: External ear normal.  Nose: Nose normal.  Mouth/Throat: Oropharynx is clear and moist.  Eyes: Conjunctivae are normal. Pupils are equal, round, and reactive to light.  Neck: Normal range of motion. Neck supple.  Cardiovascular: Normal heart sounds and intact distal pulses.  Bradycardia present.   Pulmonary/Chest: Effort normal and breath sounds normal.  Abdominal: Soft.      ED Course  Procedures (including critical care time) Labs Review Labs Reviewed  URINALYSIS, ROUTINE W REFLEX MICROSCOPIC - Abnormal; Notable for the following:    Specific Gravity, Urine 1.003 (*)      All other components within normal limits  CBC WITH DIFFERENTIAL  COMPREHENSIVE METABOLIC PANEL  LIPASE, BLOOD    Imaging Review Dg Abd Acute W/chest  01/30/2014   CLINICAL DATA:  Vomiting, diffuse abdominal pain.  EXAM: ACUTE ABDOMEN SERIES (ABDOMEN 2 VIEW & CHEST 1 VIEW)  COMPARISON:  04/21/2013 CT.  FINDINGS: Calcifications along the right hemidiaphragm are stable since prior CT. Linear scarring in the left base. Heart is normal size. No effusions.  Nonobstructive bowel gas pattern. Moderate to large stool burden throughout the colon. No free air organomegaly. Vascular stents noted in the region of the common iliac vessels.  IMPRESSION: Large stool burden throughout the colon. No evidence of bowel obstruction or free air.  Left basilar scarring. Calcifications along the right hemidiaphragm, stable.   Electronically Signed   By: Rolm Baptise M.D.   On: 01/30/2014 14:21     EKG Interpretation   Date/Time:  Friday January 30 2014 13:21:01 EDT Ventricular Rate:  51 PR Interval:  316 QRS Duration: 156 QT Interval:  499 QTC Calculation: 460 R Axis:   -47 Text Interpretation:  Sinus rhythm Prolonged PR interval First degree A-V  block RBBB and LAFB Compared to previous tracing T wave inversion now  evident in V2 Confirmed by [facility]  MD, Jenny Reichmann ([id number]) on 01/30/2014 1:27:17  PM      MDM   Final diagnoses:  Intractable vomiting with nausea, vomiting of unspecified type  Generalized abdominal pain   80 y.o. Male with difficulty swallowing and upper abdominal pain.  Patient very hypertensive here with known hypertension.  Hydralazine iv given and will try oral clonidine-but patient has been unable to drink water per his report.  Plan ct abdomen.  Reevaluate patient and gi consult if appears esophageal obstruction as chief cause of complaints. Patient seen and reassessed and signed out to Dr.Kohut.      Shaune Pollack, MD 01/30/14 1600

## 2014-01-30 NOTE — H&P (Signed)
PCP:   Sheela Stack, MD   Chief Complaint:  Cant swallow   HPI: 80 YO WM with PVD, DM 2 presents with progressive dysphagia over the past 2 months. Finally today, he couldn't even keep down liquids. Eval in the ER has revealed what appears to be an obstructing esophageal tumor with hepatic metastases.  At our most recent visit in late August, his weight was down about 3 lbs and he had a few low sugars. He noted occas dysphagia for solids but not for liquids and not predictable. He has a hx of GERD but no stricture hx.  A CT abd done in Dec showed "FINDINGS: The lack of intravenous contrast limits the ability to evaluate solid abdominal organs. Normal hepatic contour. There is potential layering high attenuation debris within the neck of an otherwise normal-appearing gallbladder (axial image 27, series 2, coronal image 58). No definitive gallbladder wall thickening or pericholecystic fluid on this noncontrast examination. No ascites" with no obvious esophageal or hepatic abnormalities noted.  He notes some RIght costal margin pain on and off for a few weeks He had some toast earlier today as the only solid food today He has been constipated His PVD sxs are stable No neuro sxs Weight is down 10 lbs or so No more low sugars since seen in OCT He does feel week No f/c/s  Review of Systems:  Review of Systems - see above Past Medical History: Past Medical History  Diagnosis Date  . Carotid stenosis     u/s 5/11: R 0-39% L 40-59% (stable)  . HTN (hypertension)     refractory. unable to cardura in past due to hypotension  . Edema   . PAD (peripheral artery disease)   . Cerebrovascular disease, unspecified   . HLD (hyperlipidemia)   . Bradycardia   . Diabetes mellitus type II   . Hyperkalemia   . Shortness of breath   . Arthritis     " IN MY HANDS "  . Chronic kidney disease, stage III (moderate)   Past History  Past Medical History (reviewed - no changes required): DM2 1992;  neuropathy, nephropathy  CAD  Carotid dz: 2013: 40-59% disease in the right internal carotid artery and 60-79% stenosis of the left internal carotid  artery, check in 2014  Hyperlipidemia  Hypertension  PVD.ABI 2014  GERD  BPH/Nocturia  RBBB 2008  Elbow Fx  CT abd 2009  IMPRESSION:  Atherosclerotic disease of the aorta and visceral vessels. No critical stenosis involving the renal arteries.  Mild to moderate amount of stenosis at the origin of the celiac artery.  Ectasia of the abdominal aorta measuring up to 2.8 cm.  Cholelithiasis.  Acute renal failure secondary to contrast induced nephropathy slowly resolving, status post hemodialysis x2 with discharge creatinine now being 2.49, previously 8  ARMD with injections: 2014  04/2010: Lexiscan Myoview demonstrated normal perfusion.  04/2012 ABIs 0.58 on the right and 0.56 on the left.  Carotid u/s RICA (S/p CEA) 58-09% LICA 98-33%  april 8250: Mild heterogeneous plaque noted on the right.  Moderate heterogeneous plaque noted on the left.  Stable, 40-59% RICA stenosis.  Stable, 53-97% LICA stenosis.  Patent vertebral arteries with antegrade flow.  Normal subclavian arteries, bilaterally.       Past Surgical History  Procedure Laterality Date  . Carotid endarterectomy  1/01    right  . Balloon angioplasty, artery Bilateral 04/09/2013    LEFT ILIAC & RT COMMON ILIAC     DR ARIDA    .  Femoral artery stent  ?   2010    DR COOPER  Surgical History (reviewed - no changes required):  (R) CEA 1/01  SCCA 11/08  Bilateral Iliac Stent 10/10  On February 11, 2009, the patient underwent  bilateral common iliac stenting with balloon expandable stents.  bilat cataracts 2014  bilateral common iliac artery kissing stent placement extending into the distal aorta in 05/2013  "04/2010: Lexiscan Myoview demonstrated normal perfusion.  04/2012 ABIs 0.58 on the right and 0.56 on the left.  Carotid US (6/14) RICA (S/p CEA) 62-22% LICA 97-98%   Renal artery dopplers (9/14) with no obvious renal artery stenosis  Medications: Prior to Admission medications   Medication Sig Start Date End Date Taking? Authorizing Provider  amLODipine (NORVASC) 10 MG tablet Take 1 tablet (10 mg total) by mouth daily. 10/15/12  Yes Amy D Clegg, NP  aspirin 81 MG tablet Take 1 tablet (81 mg total) by mouth daily. 04/11/13  Yes Rhonda G Barrett, PA-C  cloNIDine (CATAPRES) 0.3 MG tablet Take 0.6 mg by mouth 2 (two) times daily.   Yes Historical Provider, MD  clopidogrel (PLAVIX) 75 MG tablet Take 75 mg by mouth daily.    Yes Historical Provider, MD  fenofibrate 160 MG tablet Take 160 mg by mouth daily.     Yes Historical Provider, MD  folic acid (FOLVITE) 1 MG tablet Take 1 mg by mouth 2 (two) times daily.    Yes Historical Provider, MD  furosemide (LASIX) 20 MG tablet Take 1 tablet (20 mg total) by mouth daily. 08/18/13  Yes Jolaine Artist, MD  hydrALAZINE (APRESOLINE) 50 MG tablet Take 1.5 tablets (75 mg total) by mouth 3 (three) times daily. 08/15/13 09/19/15 Yes Larey Dresser, MD  Insulin Lispro Prot & Lispro (HUMALOG MIX 50/50 PEN [place]) Inject 30 Units into the skin 2 (two) times daily.    Yes Historical Provider, MD  pregabalin (LYRICA) 75 MG capsule Take 75 mg by mouth 3 (three) times daily.    Yes Historical Provider, MD    Allergies:   Allergies  Allergen Reactions  . Aliskiren Fumarate     REACTION: Reaction not known  . Doxazosin Mesylate     Unknown.   . Metformin And Related     "Metformin will kill me."      Social History:  reports that he quit smoking about 19 years ago. He has never used smokeless tobacco. He reports that he does not drink alcohol or use illicit drugs. Social History (reviewed - no changes required): Married with 4 children and 7 grandchildren  Retired 1984  No Tobacco.  In [employer] [phone], saw [place] war time       Family History: Family History  Problem Relation Age of Onset  . Coronary artery disease  Father   General Comments - FH:  Father: died 23: CAD  mom died with CAD, age 78  brother with CABG  brother with long standing DM   Physical Exam: Filed Vitals:   01/30/14 1602 01/30/14 1711 01/30/14 1818 01/30/14 1830  BP: 165/56 127/48 138/50 146/44  Pulse:  99 83 81  Temp:      TempSrc:      Resp:  24 20   SpO2:  100% 99% 99%   General appearance: WM in no distress.lying flat with no dyspnea Face symmetric,  Head: Normocephalic, without obvious abnormality, atraumatic Neck: no adenopathy, no carotid bruit, no JVD and thyroid not enlarged, symmetric, no tenderness/mass/nodules Resp: clear with no  wheezes. No accessory ms in use Cardio: regular with sys murmur.  GI: soft, tender in RUQ; bowel sounds normal; no masses,  no organomegaly Extremities:reduce pulses, trace edema Lymph nodes:   Neurologic: Alert and oriented X 3, clear fluent speech, a bit tearful  Labs on Admission:   Recent Labs  01/30/14 1458  NA 139  K 4.3  CL 97  CO2 26  GLUCOSE 97  BUN 39*  CREATININE 2.15*  CALCIUM 9.4    Recent Labs  01/30/14 1458  AST 27  ALT 15  ALKPHOS 50  BILITOT 0.4  PROT 7.5  ALBUMIN 3.9    Recent Labs  01/30/14 1458  LIPASE 35    Recent Labs  01/30/14 1458  WBC 6.5  NEUTROABS 4.9  HGB 12.1*  HCT 37.3*  MCV 87.4  PLT 182        Radiological Exams on Admission: Ct Abdomen Pelvis Wo Contrast  01/30/2014   CLINICAL DATA:  Dysphagia for 3 months, emesis starting today.  EXAM: CT ABDOMEN AND PELVIS WITHOUT CONTRAST  TECHNIQUE: Multidetector CT imaging of the abdomen and pelvis was performed following the standard protocol without IV contrast.  COMPARISON:  Acute abdominal series January 30, 2014 and CT of the abdomen and pelvis April 21, 2014  FINDINGS: LUNG BASES: Limited view of the lung bases are clear, minimal pleural thickening LEFT greater than RIGHT lung bases. Calcified RIGHT lung base pleural plaque. Heart appears mildly enlarged, included  pericardium is unremarkable.  KIDNEYS/BLADDER: Kidneys are orthotopic, demonstrating normal size and morphology. No nephrolithiasis, hydronephrosis; limited assessment for renal masses on this nonenhanced examination. The unopacified ureters are normal in course and caliber. Urinary bladder is partially distended and unremarkable.  SOLID ORGANS: New scattered hepatic hypodensities including 29 mm lesion in RIGHT lobe of the liver, segment 6, axial 16/95. The spleen, pancreas and adrenal glands are unremarkable for this non-contrast examination. Minimal layering density in the gallbladder.  GASTROINTESTINAL TRACT: New apparent hiatal hernia though, there is associated greater than expected soft tissue fullness. The stomach, small and large bowel are normal in course and caliber without inflammatory changes, the sensitivity may be decreased by lack of enteric contrast. Moderate to large amount of retained large bowel stool. The appendix is not discretely identified, however there are no inflammatory changes in the right lower quadrant. Mild sigmoid diverticulosis.  PERITONEUM/RETROPERITONEUM: Minimal Misty mesentery may be reactive, unchanged. No intraperitoneal free fluid nor free air. Severe calcific atherosclerosis of the aortoiliac vessels with mild infrarenal aortic ectasia to 2.6 cm. Aortoiliac bifurcation stent. No lymphadenopathy by CT size criteria. Subcentimeter lymph nodes the gastro esophageal junction are likely reactive. Internal reproductive organs are unremarkable.  SOFT TISSUES/ OSSEOUS STRUCTURES: Nonsuspicious. Moderate L5-S1 disc degeneration.  IMPRESSION: New hypodensities in the liver, findings are concerning for metastatic disease, additional consideration are abscess, less favored. Recommend contrast-enhanced imaging for further characterization when clinically able.  New apparent small hiatal hernia though, greater than expected soft tissue fullness could reflect esophageal mass. Consider  endoscopy.  Moderate to large amount of retained large bowel stool without bowel obstruction.  Cholelithiasis without CT findings of acute cholecystitis.  New findings discussed with and reconfirmed by Dr.Kohut on 01/30/2014 at 5:00 pm.   Electronically Signed   By: Elon Alas   On: 01/30/2014 17:01   Dg Abd Acute W/chest  01/30/2014   CLINICAL DATA:  Vomiting, diffuse abdominal pain.  EXAM: ACUTE ABDOMEN SERIES (ABDOMEN 2 VIEW & CHEST 1 VIEW)  COMPARISON:  04/21/2013 CT.  FINDINGS: Calcifications along the right hemidiaphragm are stable since prior CT. Linear scarring in the left base. Heart is normal size. No effusions.  Nonobstructive bowel gas pattern. Moderate to large stool burden throughout the colon. No free air organomegaly. Vascular stents noted in the region of the common iliac vessels.  IMPRESSION: Large stool burden throughout the colon. No evidence of bowel obstruction or free air.  Left basilar scarring. Calcifications along the right hemidiaphragm, stable.   Electronically Signed   By: Rolm Baptise M.D.   On: 01/30/2014 14:21   Orders placed during the hospital encounter of 01/30/14  . EKG 12-LEAD  . EKG 12-LEAD    Assessment/Plan Active Problems:   Esophageal cancer: appears to be a signficant problem with hepatic mets seen. Not able to eat or even handle secretions well today. To be seen by GI. ? Stent at time of bx DM TYPE 2: will go with SS insulin CKD 3: sl worse than his baseline PVD: no ischemia at rest HYPERTENSION: add a clonidine patch and PRN hydralazine CAD: stable ANEMIA: mild, likely due to [place] 01/30/2014, 6:37 PM

## 2014-01-30 NOTE — ED Notes (Signed)
Pt c/o dysphagia x 3 months and emesis starting this morning.  Denies pain.  Pt reports "I've been having to take a sip a water to get food down , but I can't get anything down now."  Pt reported to NT that he has had central/R side chest pain recently.

## 2014-01-31 ENCOUNTER — Encounter (HOSPITAL_COMMUNITY)
Admission: EM | Disposition: A | Discharge status: Home / Self Care | Payer: Self-pay | Source: Home / Self Care | Attending: Endocrinology

## 2014-01-31 ENCOUNTER — Encounter (HOSPITAL_COMMUNITY): Payer: Self-pay | Admitting: Gastroenterology

## 2014-01-31 DIAGNOSIS — C159 Malignant neoplasm of esophagus, unspecified: Principal | ICD-10-CM

## 2014-01-31 DIAGNOSIS — R1310 Dysphagia, unspecified: Secondary | ICD-10-CM

## 2014-01-31 DIAGNOSIS — R1115 Cyclical vomiting syndrome unrelated to migraine: Secondary | ICD-10-CM

## 2014-01-31 HISTORY — PX: ESOPHAGOGASTRODUODENOSCOPY: SHX5428

## 2014-01-31 LAB — GLUCOSE, CAPILLARY
GLUCOSE-CAPILLARY: 128 mg/dL — AB (ref 70–99)
Glucose-Capillary: 112 mg/dL — ABNORMAL HIGH (ref 70–99)
Glucose-Capillary: 115 mg/dL — ABNORMAL HIGH (ref 70–99)
Glucose-Capillary: 152 mg/dL — ABNORMAL HIGH (ref 70–99)

## 2014-01-31 SURGERY — EGD (ESOPHAGOGASTRODUODENOSCOPY)
Anesthesia: Moderate Sedation

## 2014-01-31 MED ORDER — FENTANYL CITRATE 0.05 MG/ML IJ SOLN
INTRAMUSCULAR | Status: AC
  Filled 2014-01-31: qty 2

## 2014-01-31 MED ORDER — SODIUM CHLORIDE 0.9 % IV SOLN
INTRAVENOUS | Status: DC
  Administered 2014-01-31: 500 mL via INTRAVENOUS

## 2014-01-31 MED ORDER — CLOPIDOGREL BISULFATE 75 MG PO TABS
75.0000 mg | ORAL_TABLET | Freq: Every day | ORAL | Status: DC
  Administered 2014-02-01: 75 mg via ORAL
  Filled 2014-01-31: qty 1

## 2014-01-31 MED ORDER — PANTOPRAZOLE SODIUM 40 MG PO TBEC
40.0000 mg | DELAYED_RELEASE_TABLET | Freq: Two times a day (BID) | ORAL | Status: DC
  Administered 2014-01-31 – 2014-02-01 (×2): 40 mg via ORAL
  Filled 2014-01-31 (×4): qty 1

## 2014-01-31 MED ORDER — PREGABALIN 75 MG PO CAPS
75.0000 mg | ORAL_CAPSULE | Freq: Three times a day (TID) | ORAL | Status: DC
  Administered 2014-01-31 – 2014-02-01 (×4): 75 mg via ORAL
  Filled 2014-01-31 (×4): qty 1

## 2014-01-31 MED ORDER — MIDAZOLAM HCL 10 MG/2ML IJ SOLN
INTRAMUSCULAR | Status: DC | PRN
  Administered 2014-01-31: 2 mg via INTRAVENOUS
  Administered 2014-01-31: 1 mg via INTRAVENOUS

## 2014-01-31 MED ORDER — FENTANYL CITRATE 0.05 MG/ML IJ SOLN
INTRAMUSCULAR | Status: DC | PRN
  Administered 2014-01-31: 25 ug via INTRAVENOUS

## 2014-01-31 MED ORDER — HYDROMORPHONE HCL 2 MG/ML IJ SOLN
2.0000 mg | INTRAMUSCULAR | Status: DC | PRN
  Administered 2014-01-31 – 2014-02-01 (×3): 2 mg via INTRAVENOUS
  Filled 2014-01-31 (×3): qty 1

## 2014-01-31 MED ORDER — ENOXAPARIN SODIUM 40 MG/0.4ML ~~LOC~~ SOLN
40.0000 mg | SUBCUTANEOUS | Status: DC
  Administered 2014-01-31: 40 mg via SUBCUTANEOUS
  Filled 2014-01-31 (×2): qty 0.4

## 2014-01-31 MED ORDER — MIDAZOLAM HCL 10 MG/2ML IJ SOLN
INTRAMUSCULAR | Status: AC
  Filled 2014-01-31: qty 2

## 2014-01-31 NOTE — Consult Note (Addendum)
[facility] (covering [facility] for Drs. Benson Norway and Collene Mares this weekend)    Referring Provider: No ref. provider found Primary Care Physician:  Sheela Stack, MD Primary Gastroenterologist:  none  Reason for Consultation: Dysphagia, abnormal CT scan   HPI:  [name] is a 80 y.o. male with progressive dysphagia over about 2-3 months.  Solids and then eventually liquids.  No pyrosis.  He takes no antiacid meds. No NSAIDs. He's lost no weight.  Otherwise feels well.  Came to hosp because the dysphagia progressed to even water.  Overnight he's been able to tolerate water, ice chips.   Past Medical History  Diagnosis Date  . Carotid stenosis     u/s 5/11: R 0-39% L 40-59% (stable)  . HTN (hypertension)     refractory. unable to cardura in past due to hypotension  . Edema   . PAD (peripheral artery disease)   . Cerebrovascular disease, unspecified   . HLD (hyperlipidemia)   . Bradycardia   . Diabetes mellitus type II   . Hyperkalemia   . Shortness of breath   . Arthritis     " IN MY HANDS "  . Chronic kidney disease, stage III (moderate)     Past Surgical History  Procedure Laterality Date  . Carotid endarterectomy  1/01    right  . Balloon angioplasty, artery Bilateral 04/09/2013    LEFT ILIAC & RT COMMON ILIAC     DR ARIDA    . Femoral artery stent  ?   2010    DR COOPER    Prior to Admission medications   Medication Sig Start Date End Date Taking? Authorizing Provider  amLODipine (NORVASC) 10 MG tablet Take 1 tablet (10 mg total) by mouth daily. 10/15/12  Yes Amy D Clegg, NP  aspirin 81 MG tablet Take 1 tablet (81 mg total) by mouth daily. 04/11/13  Yes Rhonda G Barrett, PA-C  cloNIDine (CATAPRES) 0.3 MG tablet Take 0.6 mg by mouth 2 (two) times daily.   Yes Historical Provider, MD  clopidogrel (PLAVIX) 75 MG tablet Take 75 mg by mouth daily.    Yes Historical Provider, MD  fenofibrate 160 MG tablet Take 160 mg by mouth daily.     Yes Historical Provider,  MD  folic acid (FOLVITE) 1 MG tablet Take 1 mg by mouth 2 (two) times daily.    Yes Historical Provider, MD  furosemide (LASIX) 20 MG tablet Take 1 tablet (20 mg total) by mouth daily. 08/18/13  Yes Jolaine Artist, MD  hydrALAZINE (APRESOLINE) 50 MG tablet Take 1.5 tablets (75 mg total) by mouth 3 (three) times daily. 08/15/13 09/19/15 Yes Larey Dresser, MD  Insulin Lispro Prot & Lispro (HUMALOG MIX 50/50 PEN [place]) Inject 30 Units into the skin 2 (two) times daily.    Yes Historical Provider, MD  pregabalin (LYRICA) 75 MG capsule Take 75 mg by mouth 3 (three) times daily.    Yes Historical Provider, MD    Current Facility-Administered Medications  Medication Dose Route Frequency Provider Last Rate Last Dose  . 0.9 %  sodium chloride infusion   Intravenous Continuous Sheela Stack, MD 125 mL/hr at 01/31/14 [phone]    . aspirin EC tablet 162 mg  162 mg Oral Daily Sheela Stack, MD      . cloNIDine (CATAPRES - Dosed in mg/24 hr) patch 0.3 mg  0.3 mg Transdermal Weekly Sheela Stack, MD   0.3 mg at 01/30/14 2253  . clopidogrel (PLAVIX) tablet  75 mg  75 mg Oral Daily Sheela Stack, MD      . enoxaparin (LOVENOX) injection 40 mg  40 mg Subcutaneous Q24H Sheela Stack, MD   40 mg at 01/30/14 1952  . fluticasone (FLONASE) 50 MCG/ACT nasal spray 2 spray  2 spray Each Nare Daily Sheela Stack, MD      . glucagon (human recombinant) [facility]) injection 1 mg  1 mg Intravenous Once Virgel Manifold, MD      . hydrALAZINE (APRESOLINE) injection 10 mg  10 mg Intravenous Q4H PRN Sheela Stack, MD      . HYDROmorphone (DILAUDID) injection 2 mg  2 mg Intravenous Q4H PRN Sheela Stack, MD   2 mg at 01/31/14 0131  . Influenza vac split quadrivalent PF (FLUARIX) injection 0.5 mL  0.5 mL Intramuscular Tomorrow-1000 Virgel Manifold, MD      . insulin aspart (novoLOG) injection 0-5 Units  0-5 Units Subcutaneous QHS Sheela Stack, MD      . insulin aspart (novoLOG) injection 0-9  Units  0-9 Units Subcutaneous TID WC Sheela Stack, MD      . pantoprazole (PROTONIX) injection 40 mg  40 mg Intravenous Q12H Milus Banister, MD   40 mg at 01/30/14 2252    Allergies as of 01/30/2014 - Review Complete 01/30/2014  Allergen Reaction Noted  . Aliskiren fumarate    . Doxazosin mesylate    . Metformin and related  04/21/2013    Family History  Problem Relation Age of Onset  . Coronary artery disease Father     History   Social History  . Marital Status: Married    Spouse Name: N/A    Number of Children: 4  . Years of Education: N/A   Occupational History  . Not on file.   Social History Main Topics  . Smoking status: Former Smoker    Quit date: 05/08/1994  . Smokeless tobacco: Never Used  . Alcohol Use: No  . Drug Use: No  . Sexual Activity: Not on file   Other Topics Concern  . Not on file   Social History Narrative  . No narrative on file     Review of Systems: Pertinent positive and negative review of systems were noted in the above HPI section. Complete review of systems was performed and was otherwise normal.   Physical Exam: Vital signs in last 24 hours: Temp:  [97.7 F (36.5 C)-98 F (36.7 C)] 98 F (36.7 C) (09/26 0606) Pulse Rate:  [44-99] 46 (09/26 0606) Resp:  [16-27] 18 (09/26 0606) BP: (122-231)/(34-172) 190/48 mmHg (09/26 0606) SpO2:  [94 %-100 %] 98 % (09/26 0606) Last BM Date: 01/28/14 Constitutional: generally well-appearing Psychiatric: alert and oriented x3 Eyes: extraocular movements intact Mouth: oral pharynx moist, no lesions Neck: supple no lymphadenopathy Cardiovascular: heart regular rate and rhythm Lungs: clear to auscultation bilaterally Abdomen: soft, nontender, nondistended, no obvious ascites, no peritoneal signs, normal bowel sounds Extremities: no lower extremity edema bilaterally Skin: no lesions on visible extremities   Lab Results:  Recent Labs  01/30/14 1458  WBC 6.5  HGB 12.1*  HCT  37.3*  PLT 182  MCV 87.4   BMET  Recent Labs  01/30/14 1458  NA 139  K 4.3  CL 97  CO2 26  GLUCOSE 97  BUN 39*  CREATININE 2.15*  CALCIUM 9.4   LFT  Recent Labs  01/30/14 1458  BILITOT 0.4  AST 27  ALT 15  ALKPHOS 50  PROT 7.5  ALBUMIN 3.9   Imaging/Other Results: Ct Abdomen Pelvis Wo Contrast  01/30/2014   CLINICAL DATA:  Dysphagia for 3 months, emesis starting today.  EXAM: CT ABDOMEN AND PELVIS WITHOUT CONTRAST  TECHNIQUE: Multidetector CT imaging of the abdomen and pelvis was performed following the standard protocol without IV contrast.  COMPARISON:  Acute abdominal series January 30, 2014 and CT of the abdomen and pelvis April 21, 2014  FINDINGS: LUNG BASES: Limited view of the lung bases are clear, minimal pleural thickening LEFT greater than RIGHT lung bases. Calcified RIGHT lung base pleural plaque. Heart appears mildly enlarged, included pericardium is unremarkable.  KIDNEYS/BLADDER: Kidneys are orthotopic, demonstrating normal size and morphology. No nephrolithiasis, hydronephrosis; limited assessment for renal masses on this nonenhanced examination. The unopacified ureters are normal in course and caliber. Urinary bladder is partially distended and unremarkable.  SOLID ORGANS: New scattered hepatic hypodensities including 29 mm lesion in RIGHT lobe of the liver, segment 6, axial 16/95. The spleen, pancreas and adrenal glands are unremarkable for this non-contrast examination. Minimal layering density in the gallbladder.  GASTROINTESTINAL TRACT: New apparent hiatal hernia though, there is associated greater than expected soft tissue fullness. The stomach, small and large bowel are normal in course and caliber without inflammatory changes, the sensitivity may be decreased by lack of enteric contrast. Moderate to large amount of retained large bowel stool. The appendix is not discretely identified, however there are no inflammatory changes in the right lower quadrant.  Mild sigmoid diverticulosis.  PERITONEUM/RETROPERITONEUM: Minimal Misty mesentery may be reactive, unchanged. No intraperitoneal free fluid nor free air. Severe calcific atherosclerosis of the aortoiliac vessels with mild infrarenal aortic ectasia to 2.6 cm. Aortoiliac bifurcation stent. No lymphadenopathy by CT size criteria. Subcentimeter lymph nodes the gastro esophageal junction are likely reactive. Internal reproductive organs are unremarkable.  SOFT TISSUES/ OSSEOUS STRUCTURES: Nonsuspicious. Moderate L5-S1 disc degeneration.  IMPRESSION: New hypodensities in the liver, findings are concerning for metastatic disease, additional consideration are abscess, less favored. Recommend contrast-enhanced imaging for further characterization when clinically able.  New apparent small hiatal hernia though, greater than expected soft tissue fullness could reflect esophageal mass. Consider endoscopy.  Moderate to large amount of retained large bowel stool without bowel obstruction.  Cholelithiasis without CT findings of acute cholecystitis.  New findings discussed with and reconfirmed by Dr.Kohut on 01/30/2014 at 5:00 pm.   Electronically Signed   By: Elon Alas   On: 01/30/2014 17:01   Dg Abd Acute W/chest  01/30/2014   CLINICAL DATA:  Vomiting, diffuse abdominal pain.  EXAM: ACUTE ABDOMEN SERIES (ABDOMEN 2 VIEW & CHEST 1 VIEW)  COMPARISON:  04/21/2013 CT.  FINDINGS: Calcifications along the right hemidiaphragm are stable since prior CT. Linear scarring in the left base. Heart is normal size. No effusions.  Nonobstructive bowel gas pattern. Moderate to large stool burden throughout the colon. No free air organomegaly. Vascular stents noted in the region of the common iliac vessels.  IMPRESSION: Large stool burden throughout the colon. No evidence of bowel obstruction or free air.  Left basilar scarring. Calcifications along the right hemidiaphragm, stable.   Electronically Signed   By: Rolm Baptise M.D.   On:  01/30/2014 14:21      Impression/Plan: 80 y.o. male with progressive dysphagia, on plavix  CT scan (non-IV contrast) suggests possible mass at esophagus and lesions in liver.  Certainly could be metastatic esophageal cancer.  He's not been losing weight which is unusual for met esophageal cancer.  Perhaps just  GERD, peptic related?  I started him on PPI IV last night and he is tolerating his secretions, water this AM.  He is on plavix, received lovenox last night and so it is probably not safe to dilate currently.  5 day plavix washout is ideal.  Will plan on EGD today, will be in late morning or early afternoon.  Can biopsy if mass is noted.  I will start holding plavix today.  Can continue lovenox for now.    Milus Banister, MD  01/31/2014, 7:47 AM [facility] Pager [phone]

## 2014-01-31 NOTE — Op Note (Signed)
[place], [address]   ENDOSCOPY PROCEDURE REPORT  PATIENT: [name], [name]  MR#: [id number] BIRTHDATE: 1930/04/25 , 36  yrs. old GENDER: male ENDOSCOPIST: Milus Banister, MD (covering [place] for Drs. Mann/Hung this weekend) REFERRED BY:  Dr. Forde Dandy PROCEDURE DATE:  01/31/2014 PROCEDURE:  EGD w/ biopsy ASA CLASS:     Class III INDICATIONS:  dysphagia, abnormal CT scan (?mass at GE junction, ? masses in liver; was non-IV contrast). MEDICATIONS: Fentanyl 25 mcg IV and Versed 3 mg IV TOPICAL ANESTHETIC: Cetacaine Spray  DESCRIPTION OF PROCEDURE: After the risks benefits and alternatives of the procedure were thoroughly explained, informed consent was obtained.  The Pentax Gastroscope [id number] endoscope was introduced through the mouth and advanced to the second portion of the duodenum , Without limitations.  The instrument was slowly withdrawn as the mucosa was fully examined.  There was an ulcerated, circumferential 2-3cm mass at the GE junction (all on the esophageal side of the junction) which causes incomplete stricture.  The 65mm adult gastroscope was able to be advanced through the stricture without difficulty.  The examination was otherwise normal.  The junction mass was extensively biopsied. Retroflexed views revealed no abnormalities.     The scope was then withdrawn from the patient and the procedure completed.  COMPLICATIONS: There were no complications.  ENDOSCOPIC IMPRESSION: There was an ulcerated, circumferential 2-3cm mass at the GE junction (all on the esophageal side of the junction) which causes incomplete stricture.  The 22mm adult gastroscope was able to be advanced through the stricture without difficulty.  The examination was otherwise normal.  The junction mass was extensively biopsied   RECOMMENDATIONS: He is swallowing better on PPI, but I don't think he should advance his diet past puree type consistency. Should  continue PPI twice daily indefinitely.  Await final pathology. Workup for this can be as outpatient, I will defer discharge decision (in-patient vs out-patient oncology consultation) to his primary care provider. 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   eSigned:  Milus Banister, MD 01/31/2014 3:11 PM

## 2014-01-31 NOTE — Progress Notes (Signed)
Patient with complaint of severe neuropathic pain in feet.  Patient takes Lyrica at home but this has not been resumed.  Dr. Forde Dandy notified and order received to resume medication.  Zandra Abts [facility]  01/31/2014

## 2014-01-31 NOTE — Progress Notes (Signed)
Subjective: Had a reasonable night. Doing better with secretions. Had pain in the middle of the night but none now. Breathing well   Objective: Vital signs in last 24 hours: Temp:  [97.7 F (36.5 C)-98 F (36.7 C)] 98 F (36.7 C) (09/26 0606) Pulse Rate:  [44-99] 46 (09/26 0606) Resp:  [16-27] 18 (09/26 0606) BP: (122-231)/(34-172) 190/48 mmHg (09/26 0606) SpO2:  [94 %-100 %] 98 % (09/26 0606)  Intake/Output from previous day: 09/25 0701 - 09/26 0700 In: 1000 [I.V.:1000] Out: 2320 [Urine:2320] Intake/Output this shift:    Less toxic, lying at 30 degrees no distress. Lungs clear ht regular abd still a bit tender RUQ, mentating well  Lab Results   Recent Labs  01/30/14 1458  WBC 6.5  RBC 4.27  HGB 12.1*  HCT 37.3*  MCV 87.4  MCH 28.3  RDW 14.8  PLT 182    Recent Labs  01/30/14 1458  NA 139  K 4.3  CL 97  CO2 26  GLUCOSE 97  BUN 39*  CREATININE 2.15*  CALCIUM 9.4    Studies/Results: Ct Abdomen Pelvis Wo Contrast  01/30/2014   CLINICAL DATA:  Dysphagia for 3 months, emesis starting today.  EXAM: CT ABDOMEN AND PELVIS WITHOUT CONTRAST  TECHNIQUE: Multidetector CT imaging of the abdomen and pelvis was performed following the standard protocol without IV contrast.  COMPARISON:  Acute abdominal series January 30, 2014 and CT of the abdomen and pelvis April 21, 2014  FINDINGS: LUNG BASES: Limited view of the lung bases are clear, minimal pleural thickening LEFT greater than RIGHT lung bases. Calcified RIGHT lung base pleural plaque. Heart appears mildly enlarged, included pericardium is unremarkable.  KIDNEYS/BLADDER: Kidneys are orthotopic, demonstrating normal size and morphology. No nephrolithiasis, hydronephrosis; limited assessment for renal masses on this nonenhanced examination. The unopacified ureters are normal in course and caliber. Urinary bladder is partially distended and unremarkable.  SOLID ORGANS: New scattered hepatic hypodensities including 29 mm  lesion in RIGHT lobe of the liver, segment 6, axial 16/95. The spleen, pancreas and adrenal glands are unremarkable for this non-contrast examination. Minimal layering density in the gallbladder.  GASTROINTESTINAL TRACT: New apparent hiatal hernia though, there is associated greater than expected soft tissue fullness. The stomach, small and large bowel are normal in course and caliber without inflammatory changes, the sensitivity may be decreased by lack of enteric contrast. Moderate to large amount of retained large bowel stool. The appendix is not discretely identified, however there are no inflammatory changes in the right lower quadrant. Mild sigmoid diverticulosis.  PERITONEUM/RETROPERITONEUM: Minimal Misty mesentery may be reactive, unchanged. No intraperitoneal free fluid nor free air. Severe calcific atherosclerosis of the aortoiliac vessels with mild infrarenal aortic ectasia to 2.6 cm. Aortoiliac bifurcation stent. No lymphadenopathy by CT size criteria. Subcentimeter lymph nodes the gastro esophageal junction are likely reactive. Internal reproductive organs are unremarkable.  SOFT TISSUES/ OSSEOUS STRUCTURES: Nonsuspicious. Moderate L5-S1 disc degeneration.  IMPRESSION: New hypodensities in the liver, findings are concerning for metastatic disease, additional consideration are abscess, less favored. Recommend contrast-enhanced imaging for further characterization when clinically able.  New apparent small hiatal hernia though, greater than expected soft tissue fullness could reflect esophageal mass. Consider endoscopy.  Moderate to large amount of retained large bowel stool without bowel obstruction.  Cholelithiasis without CT findings of acute cholecystitis.  New findings discussed with and reconfirmed by Dr.Kohut on 01/30/2014 at 5:00 pm.   Electronically Signed   By: Elon Alas   On: 01/30/2014 17:01   Dg  Abd Acute W/chest  01/30/2014   CLINICAL DATA:  Vomiting, diffuse abdominal pain.  EXAM:  ACUTE ABDOMEN SERIES (ABDOMEN 2 VIEW & CHEST 1 VIEW)  COMPARISON:  04/21/2013 CT.  FINDINGS: Calcifications along the right hemidiaphragm are stable since prior CT. Linear scarring in the left base. Heart is normal size. No effusions.  Nonobstructive bowel gas pattern. Moderate to large stool burden throughout the colon. No free air organomegaly. Vascular stents noted in the region of the common iliac vessels.  IMPRESSION: Large stool burden throughout the colon. No evidence of bowel obstruction or free air.  Left basilar scarring. Calcifications along the right hemidiaphragm, stable.   Electronically Signed   By: Rolm Baptise M.D.   On: 01/30/2014 14:21    Scheduled Meds: . aspirin EC  162 mg Oral Daily  . cloNIDine  0.3 mg Transdermal Weekly  . enoxaparin (LOVENOX) injection  40 mg Subcutaneous Q24H  . fluticasone  2 spray Each Nare Daily  . glucagon (human recombinant)  1 mg Intravenous Once  . Influenza vac split quadrivalent PF  0.5 mL Intramuscular Tomorrow-1000  . insulin aspart  0-5 Units Subcutaneous QHS  . insulin aspart  0-9 Units Subcutaneous TID WC  . pantoprazole (PROTONIX) IV  40 mg Intravenous Q12H   Continuous Infusions: . sodium chloride 125 mL/hr at 01/31/14 0658   PRN Meds:hydrALAZINE, HYDROmorphone (DILAUDID) injection  Assessment/Plan:  Esophageal cancer: to have diagnostic EGD later today DM TYPE 2: doing K CKD 3: check in AM PVD: no ischemia at rest  HYPERTENSION: doing OK  CAD: stable  ANEMIA: mild, likely due to CA    LOS: 1 day   Eran Mistry ALAN 01/31/2014, 9:48 AM

## 2014-01-31 NOTE — Interval H&P Note (Signed)
History and Physical Interval Note:  01/31/2014 2:42 PM  [name]  has presented today for surgery, with the diagnosis of dysphagia, abnormal CT  The various methods of treatment have been discussed with the patient and family. After consideration of risks, benefits and other options for treatment, the patient has consented to  Procedure(s): ESOPHAGOGASTRODUODENOSCOPY (EGD) (N/A) as a surgical intervention .  The patient's history has been reviewed, patient examined, no change in status, stable for surgery.  I have reviewed the patient's chart and labs.  Questions were answered to the patient's satisfaction.     Milus Banister

## 2014-02-01 LAB — COMPREHENSIVE METABOLIC PANEL
ALT: 12 U/L (ref 0–53)
AST: 22 U/L (ref 0–37)
Albumin: 3 g/dL — ABNORMAL LOW (ref 3.5–5.2)
Alkaline Phosphatase: 47 U/L (ref 39–117)
Anion gap: 13 (ref 5–15)
BUN: 22 mg/dL (ref 6–23)
CALCIUM: 8.5 mg/dL (ref 8.4–10.5)
CO2: 23 mEq/L (ref 19–32)
Chloride: 106 mEq/L (ref 96–112)
Creatinine, Ser: 1.44 mg/dL — ABNORMAL HIGH (ref 0.50–1.35)
GFR calc Af Amer: 51 mL/min — ABNORMAL LOW (ref >90)
GFR calc non Af Amer: 44 mL/min — ABNORMAL LOW (ref >90)
Glucose, Bld: 140 mg/dL — ABNORMAL HIGH (ref 70–99)
Potassium: 4.1 mEq/L (ref 3.7–5.3)
SODIUM: 142 meq/L (ref 137–147)
Total Bilirubin: 0.4 mg/dL (ref 0.3–1.2)
Total Protein: 6 g/dL (ref 6.0–8.3)

## 2014-02-01 LAB — GLUCOSE, CAPILLARY: GLUCOSE-CAPILLARY: 133 mg/dL — AB (ref 70–99)

## 2014-02-01 LAB — CBC
HEMATOCRIT: 33.1 % — AB (ref 39.0–52.0)
HEMOGLOBIN: 10.8 g/dL — AB (ref 13.0–17.0)
MCH: 29 pg (ref 26.0–34.0)
MCHC: 32.6 g/dL (ref 30.0–36.0)
MCV: 89 fL (ref 78.0–100.0)
Platelets: 139 10*3/uL — ABNORMAL LOW (ref 150–400)
RBC: 3.72 MIL/uL — AB (ref 4.22–5.81)
RDW: 14.7 % (ref 11.5–15.5)
WBC: 3.9 10*3/uL — ABNORMAL LOW (ref 4.0–10.5)

## 2014-02-01 MED ORDER — TRAMADOL HCL 50 MG PO TABS: 50.0000 mg | ORAL_TABLET | Freq: Two times a day (BID) | ORAL | Status: DC | PRN

## 2014-02-01 NOTE — Discharge Instructions (Signed)
Reduce insulin to 15 units twice a day If you have any lows, reduce to 10 twice a day

## 2014-02-01 NOTE — Plan of Care (Signed)
Problem: Discharge Progression Outcomes Goal: Discharge plan in place and appropriate Outcome: Adequate for Discharge Discharged to grandaughter and wife via wheelchair. Discharge instructions explained to grandaughter and pt. One prescription given to grandaughter.

## 2014-02-01 NOTE — Progress Notes (Signed)
Grandaughter called with question about Catapres patch, and when it should come off. She was instructed to remove patch in the AM before starting back on oral Catapres.

## 2014-02-01 NOTE — Discharge Summary (Signed)
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     aspirin 81 MG tablet  Take 1 tablet (81 mg total) by mouth daily.     cloNIDine 0.3 MG tablet  Commonly known as:  CATAPRES  Take 0.6 mg by mouth 2 (two) times daily.     clopidogrel 75 MG tablet  Commonly known as:  PLAVIX  Take 75 mg by mouth daily.     HUMALOG MIX 50/50 PEN [place]  Inject 30 Units into the skin 2 (two) times daily.     hydrALAZINE 50 MG tablet  Commonly known as:  APRESOLINE  Take 1.5 tablets (75 mg total) by mouth 3 (three) times daily.     pregabalin 75 MG capsule  Commonly known as:  LYRICA  Take 75 mg by mouth 3 (three) times daily.     traMADol 50 MG tablet  Commonly known as:  ULTRAM  Take 1 tablet (50 mg total) by mouth every 12 (twelve) hours as needed for moderate pain.        Hospital Procedures: Ct Abdomen Pelvis Wo Contrast  01/30/2014   CLINICAL DATA:  Dysphagia for 3 months, emesis starting today.  EXAM: CT ABDOMEN AND PELVIS WITHOUT CONTRAST  TECHNIQUE:  Multidetector CT imaging of the abdomen and pelvis was performed following the standard protocol without IV contrast.  COMPARISON:  Acute abdominal series January 30, 2014 and CT of the abdomen and pelvis April 21, 2014  FINDINGS: LUNG BASES: Limited view of the lung bases are clear, minimal pleural thickening LEFT greater than RIGHT lung bases. Calcified RIGHT lung base pleural plaque. Heart appears mildly enlarged, included pericardium is unremarkable.  KIDNEYS/BLADDER: Kidneys are orthotopic, demonstrating normal size and morphology. No nephrolithiasis, hydronephrosis; limited assessment for renal masses on this nonenhanced examination. The unopacified ureters are normal in course and caliber. Urinary bladder is partially distended and unremarkable.  SOLID ORGANS: New scattered hepatic hypodensities including 29 mm lesion in RIGHT lobe of the liver, segment 6, axial 16/95. The spleen, pancreas and adrenal glands are unremarkable for this non-contrast examination. Minimal layering density in the gallbladder.  GASTROINTESTINAL TRACT: New apparent hiatal hernia though, there is associated greater than expected soft tissue fullness. The stomach, small and large bowel are normal in course and caliber without inflammatory changes, the sensitivity may be decreased by lack of enteric contrast. Moderate to large amount of retained large bowel stool. The appendix is not discretely identified, however there are no inflammatory changes in the right lower quadrant.  Mild sigmoid diverticulosis.  PERITONEUM/RETROPERITONEUM: Minimal Misty mesentery may be reactive, unchanged. No intraperitoneal free fluid nor free air. Severe calcific atherosclerosis of the aortoiliac vessels with mild infrarenal aortic ectasia to 2.6 cm. Aortoiliac bifurcation stent. No lymphadenopathy by CT size criteria. Subcentimeter lymph nodes the gastro esophageal junction are likely reactive. Internal reproductive organs are unremarkable.  SOFT  TISSUES/ OSSEOUS STRUCTURES: Nonsuspicious. Moderate L5-S1 disc degeneration.  IMPRESSION: New hypodensities in the liver, findings are concerning for metastatic disease, additional consideration are abscess, less favored. Recommend contrast-enhanced imaging for further characterization when clinically able.  New apparent small hiatal hernia though, greater than expected soft tissue fullness could reflect esophageal mass. Consider endoscopy.  Moderate to large amount of retained large bowel stool without bowel obstruction.  Cholelithiasis without CT findings of acute cholecystitis.  New findings discussed with and reconfirmed by Dr.Kohut on 01/30/2014 at 5:00 pm.   Electronically Signed   By: Elon Alas   On: 01/30/2014 17:01   Dg Abd Acute W/chest  01/30/2014   CLINICAL DATA:  Vomiting, diffuse abdominal pain.  EXAM: ACUTE ABDOMEN SERIES (ABDOMEN 2 VIEW & CHEST 1 VIEW)  COMPARISON:  04/21/2013 CT.  FINDINGS: Calcifications along the right hemidiaphragm are stable since prior CT. Linear scarring in the left base. Heart is normal size. No effusions.  Nonobstructive bowel gas pattern. Moderate to large stool burden throughout the colon. No free air organomegaly. Vascular stents noted in the region of the common iliac vessels.  IMPRESSION: Large stool burden throughout the colon. No evidence of bowel obstruction or free air.  Left basilar scarring. Calcifications along the right hemidiaphragm, stable.   Electronically Signed   By: Rolm Baptise M.D.   On: 01/30/2014 14:21    History of Present Illness: Swallowing trouble  Hospital Course: This is an 80 year old white male with extensive vascular history as well as diabetes who presented with swallowing difficulties. A workup in the emergency room unfortunately showed a mass at the end of the esophagus and hepatic metastases. An endoscopy was done yesterday confirming an ulcerated mass that was asymmetric and normal certainly malignant with biopsies done  but now pending. This appears to be a stage IV GE junction origin esophageal carcinoma. The were able to pass a 9 mm scope past the mass and the patient has been able to tolerate a pured diet. He is handling his secretions better. His primary complaint is of neuropathic foot pain. He is doing fair dealing with this diagnosis. His brother died of a similar problem 6 years ago. Oncology has not yet been consulted. I'm hopeful that a tyrosine kinase inhibitor might be an option Record have to watch his sugars were carefully. I cut his insulin in half and instructed him to cut it further if necessary. I've added some Ultram for pain I reduced his medication list  Day of Discharge Exam BP 201/56  Pulse 86  Temp(Src) 98.5 F (36.9 C) (Oral)  Resp 18  Ht 5' 10.5" (1.791 m)  Wt 76.703 kg (169 lb 1.6 oz)  BMI 23.91 kg/m2  SpO2 98%  Physical Exam: General appearance: Sitting up in no distress Eyes: no scleral icterus Throat: oropharynx moist without erythema Resp: Clear with no wheezes rales or rhonchi. No excess her muscles in use Cardio: Regular with systolic murmur GI: soft, tender at the right costal margin; bowel sounds normal; no masses,  Extremities: Reduced pulses no edema Neuro: He is awake alert and mentating well. He is somewhat anxious and dysphoric appropriately.  Discharge  Labs:  Recent Labs  01/30/14 1458 02/01/14 0530  NA 139 142  K 4.3 4.1  CL 97 106  CO2 26 23  GLUCOSE 97 140*  BUN 39* 22  CREATININE 2.15* 1.44*  CALCIUM 9.4 8.5    Recent Labs  01/30/14 1458 02/01/14 0530  AST 27 22  ALT 15 12  ALKPHOS 50 47  BILITOT 0.4 0.4  PROT 7.5 6.0  ALBUMIN 3.9 3.0*    Recent Labs  01/30/14 1458 02/01/14 0530  WBC 6.5 3.9*  NEUTROABS 4.9  --   HGB 12.1* 10.8*  HCT 37.3* 33.1*  MCV 87.4 89.0  PLT 182 139*        Discharge instructions: Cut your insulin in half. If you start having lows take 5 more units at each dose   Disposition: To  home  Follow-up Appts: Follow-up with Dr. Forde Dandy at [facility] in next week  Call for appointment.  Condition on Discharge: Fair  Tests Needing Follow-up: Pathology from  a laterally I used a 4 I did 13 with and remarks as the reproductive biopsy  Signed: Kashae Carstens ALAN 02/01/2014, 11:02 AM

## 2014-02-02 ENCOUNTER — Encounter (HOSPITAL_COMMUNITY): Payer: Self-pay | Admitting: Gastroenterology

## 2014-02-16 ENCOUNTER — Telehealth: Payer: Self-pay | Admitting: *Deleted

## 2014-02-16 NOTE — Telephone Encounter (Signed)
This RN received message from pt stating " I was in the hospital last week and was told I have esophageal cancer and I went to see a doctor at [facility] but he said he could do anything for me "  " so I am calling to see if you could help me " " the doctor at [facility] suggested I see an oncologist in [place] and keep my care closer to my home ".  Per message he left a return call number as [phone].  This RN inquired with Dr Jannifer Rodney - he does not recall consulting on this patient.  This note will be followed up for appropriate care.

## 2014-02-18 ENCOUNTER — Encounter: Payer: Self-pay | Admitting: Emergency Medicine

## 2014-02-18 NOTE — Progress Notes (Signed)
Contacted Dr Lorain Childes referral coordinator; for their office to send a referral for patient to be seen by Dr Benay Spice at [facility]. Left message for referral coordinator to call this office back for further assistance.

## 2014-02-19 ENCOUNTER — Telehealth: Payer: Self-pay | Admitting: Oncology

## 2014-02-19 ENCOUNTER — Telehealth: Payer: Self-pay | Admitting: Nurse Practitioner

## 2014-02-19 NOTE — Telephone Encounter (Signed)
S/W PATIENT AND GAVE NP APPT FOR 10/19 @ 1:15 W/LISA THOMAS REFERRING DR. Joylene Draft DX- ESOPHAGEAL CA

## 2014-02-19 NOTE — Telephone Encounter (Signed)
C/D 02/19/14 for appt. 02/23/14

## 2014-02-23 ENCOUNTER — Telehealth: Payer: Self-pay | Admitting: Oncology

## 2014-02-23 ENCOUNTER — Encounter: Payer: Self-pay | Admitting: Nurse Practitioner

## 2014-02-23 ENCOUNTER — Ambulatory Visit: Payer: Medicare Other

## 2014-02-23 ENCOUNTER — Ambulatory Visit (HOSPITAL_BASED_OUTPATIENT_CLINIC_OR_DEPARTMENT_OTHER): Payer: Medicare Other | Admitting: Nurse Practitioner

## 2014-02-23 VITALS — BP 164/36 | HR 46 | Temp 97.8°F | Resp 18 | Ht 69.0 in | Wt 177.1 lb

## 2014-02-23 DIAGNOSIS — I10 Essential (primary) hypertension: Secondary | ICD-10-CM

## 2014-02-23 DIAGNOSIS — R1319 Other dysphagia: Secondary | ICD-10-CM

## 2014-02-23 DIAGNOSIS — E1140 Type 2 diabetes mellitus with diabetic neuropathy, unspecified: Secondary | ICD-10-CM

## 2014-02-23 DIAGNOSIS — N189 Chronic kidney disease, unspecified: Secondary | ICD-10-CM

## 2014-02-23 DIAGNOSIS — C160 Malignant neoplasm of cardia: Secondary | ICD-10-CM

## 2014-02-23 NOTE — Progress Notes (Signed)
Checked in new patient with no financial issues prior to seeing the dr. He has appt card and has not been out of the country.

## 2014-02-23 NOTE — Telephone Encounter (Signed)
gv and printed appt sched and avs for pt for OCT. °

## 2014-02-23 NOTE — Progress Notes (Addendum)
[place] New Patient Consult   Referring MD: Herminio Commons [place], Md [address] [place], [place] [address]   [name] 80 y.o.  05/05/1930    Reason for Referral: Adenocarcinoma GE junction    HPI: [name] is an 16 year old man with a 4-5 month history of progressive dysphagia. No associated pain. He presented to the Emergency Department for evaluation on 01/30/2014 due to food becoming "stuck".   He underwent an upper endoscopy by Dr. Ardis Hughs on 01/31/2014 with findings of an ulcerated circumferential 2-3 mass at the GE junction (all on the esophageal side at the junction) which caused incomplete stricture. The 9 mm adult gastroscope was able to be advanced through the stricture without difficulty. Biopsy of the mass showed invasive poorly differentiated adenocarcinoma. There was no amplification of HER-2 detected.  CT scans of the abdomen and pelvis on 01/30/2014 showed new hypodensities in the liver concerning for metastatic disease. There was a new apparent small hiatal hernia though greater than expected soft tissue fullness. Moderate to large amount of retained large bowel stool without bowel obstruction. Cholelithiasis without CT findings of acute cholecystitis.    Past Medical History  Diagnosis Date  . Carotid stenosis     u/s 5/11: R 0-39% L 40-59% (stable)  . HTN (hypertension)     refractory. unable to cardura in past due to hypotension  . Edema   . PAD (peripheral artery disease)   . Cerebrovascular disease, unspecified   . HLD (hyperlipidemia)   . Bradycardia   . Diabetes mellitus type II   . Hyperkalemia   . Shortness of breath   . Arthritis     " IN MY HANDS "  . Chronic kidney disease, stage III (moderate)   . Stroke     Past Surgical History  Procedure Laterality Date  . Carotid endarterectomy  1/01    right  . Balloon angioplasty, artery Bilateral 04/09/2013    LEFT ILIAC & RT COMMON ILIAC     DR ARIDA    . Femoral artery  stent  ?   2010    DR COOPER  . Esophagogastroduodenoscopy N/A 01/31/2014    Procedure: ESOPHAGOGASTRODUODENOSCOPY (EGD);  Surgeon: Milus Banister, MD;  Location: [facility];  Service: Endoscopy;  Laterality: N/A;    Medications: Reviewed  Allergies:  Allergies  Allergen Reactions  . Contrast Media [Iodinated Diagnostic Agents]     History of renal failure following contrast requiring temporary dialysis  . Aliskiren Fumarate     REACTION: Reaction not known  . Doxazosin Mesylate     Unknown.   . Metformin And Related     "Metformin will kill me."      Family history: Mother deceased at age 70. Father deceased at age 86 with MI, possible history of diabetes. Brother diagnosed with stage II esophagus cancer 5 years ago; diabetes mellitus. Brother with diabetes mellitus. Brother deceased with diabetes, MI age 19.  Social History: He lives in [place]. He is married. He has 4 children, 2 sons and 2 daughters. One daughter has diabetes. One son has had an MI. He retired from [employer] in 1976. 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 He quit smoking in 1993. He began smoking at age 24. No alcohol use "in a long time". He has never had a blood transfusion   ROS: Progressive dysphagia over the past 4-5 months. No pain. No weight loss. No nausea or vomiting. No fevers  or sweats. He denies any unusual headaches or vision change. He reports a history of macular degeneration. No shortness of breath or cough. No chest pain. He reports chronic constipation which he thinks is medication related. No bloody or black stools. No hematuria or dysuria. He reports neuropathy in both legs related to diabetes. The neuropathy is characterized by lower leg numbness as well as tingling in the feet. He is on Lyrica.  Physical Exam:  Blood pressure 164/36, pulse 46, temperature 97.8 F (36.6 C), temperature source Oral, resp. rate 18, height _0  (1.753 m),  weight 177 lb 1.6 oz (80.332 kg).  HEENT: Pupils equal round and reactive to light. Extraocular movements intact. Sclera anicteric. Oropharynx is without thrush or ulceration. Lungs: Breath sounds diminished at the left lung base. No respiratory distress. Cardiac: Regular rate and rhythm. Abdomen: Soft and nontender. No organomegaly. No mass.  Vascular: Bilateral pitting pretibial edema. Chronic stasis changes bilaterally. Lymph nodes: No palpable cervical, supraclavicular, axillary or inguinal lymph nodes. Neurologic: Alert and oriented. Motor strength 5 over 5. Knee DTRs 1+, symmetric. Skin: Ecchymoses scattered over lower arms.   LAB:  CBC  Lab Results  Component Value Date   WBC 3.9* 02/01/2014   HGB 10.8* 02/01/2014   HCT 33.1* 02/01/2014   MCV 89.0 02/01/2014   PLT 139* 02/01/2014   NEUTROABS 4.9 01/30/2014     CMP      Component Value Date/Time   NA 142 02/01/2014 0530   K 4.1 02/01/2014 0530   CL 106 02/01/2014 0530   CO2 23 02/01/2014 0530   GLUCOSE 140* 02/01/2014 0530   BUN 22 02/01/2014 0530   CREATININE 1.44* 02/01/2014 0530   CALCIUM 8.5 02/01/2014 0530   PROT 6.0 02/01/2014 0530   ALBUMIN 3.0* 02/01/2014 0530   AST 22 02/01/2014 0530   ALT 12 02/01/2014 0530   ALKPHOS 47 02/01/2014 0530   BILITOT 0.4 02/01/2014 0530   GFRNONAA 44* 02/01/2014 0530   GFRAA 51* 02/01/2014 0530      Imaging:  No results found.    Assessment/Plan:   1. GE junction adenocarcinoma  EGD 01/31/2014 with an ulcerated circumferential 2-3 cm mass at the GE junction all on the esophageal side causing incomplete stricture.  Biopsy showed invasive poorly differentiated adenocarcinoma; no amplification of HER-2.  CT abdomen/pelvis 01/30/2014 showed new hypodensities in the liver concerning for metastatic disease. 2. Dysphagia secondary to #1 3. Diabetes mellitus 4. Chronic kidney disease 5. Peripheral neuropathy related to diabetes 6. Peripheral arterial  disease 7. Hypertension 8. Hyperlipidemia   Disposition: [name] has been diagnosed with adenocarcinoma of the GE junction. He appears to have metastatic disease involving the liver. Dr. Benay Spice discussed the diagnosis, prognosis and treatment options with Mr. Fitzpatrick and his family. They understand that no therapy will be curative if the liver lesions on CT represent metastatic disease. We are referring him for a staging PET scan for further delineation of the liver lesions.  He is symptomatic with dysphagia although at present is tolerating a relatively regular diet. We will refer him to the [facility] dietitian.  He has multiple comorbid conditions including chronic kidney disease and neuropathy that will limit use of certain chemotherapy drugs. In general however he seems to have a good performance status and appears to be a candidate for systemic therapy which will be discussed in detail at the time of his next visit when the PET scan result is available.  He will return for a followup visit on  03/04/2014. We will obtain a CEA at that time. He will contact the office in the interim with any problems.  Patient seen with Dr. Benay Spice. 50 minutes were spent face-to-face at today's visit with the majority of that time involved in counseling/coordination of care.   Ned Card, ANP/GNP-BC 02/23/2014, 2:23 PM  This was a shared visit with Ned Card. Mr. Conover was interviewed and examined.   He has been diagnosed with adenocarcinoma of the GE junction and appears to have metastatic disease involving the liver. I reviewed the CT images with him today. We discussed treatment options. He is interested in a trial of systemic chemotherapy. He understands no therapy will be curative if he has metastatic disease. We decided to proceed with a staging PET scan to confirm the presence of liver metastases. He will return for an office visit and further discussion after the staging PET scan. We  will need to take into consideration his multiple comorbid conditions when recommending a chemotherapy regimen.  Julieanne Manson, M.D.

## 2014-02-27 ENCOUNTER — Ambulatory Visit: Payer: Medicare Other | Admitting: Nutrition

## 2014-02-27 NOTE — Progress Notes (Signed)
80 year old male diagnosed with cancer of the GE junction, which appears metastatic.  He is a patient of Dr. Benay Spice.  Past medical history includes carotid stenosis, hypertension, anemia, peripheral artery disease, hyperlipidemia, diabetes, chronic kidney disease stage III, and stroke.  Medications include Humalog.  Labs include glucose 140, creatinine 1.4, and albumin 3.0 on September 27.  Height: 69 inches. Weight: 177.1 pounds October 19. Usual body weight: 188 pounds January 2015. BMI: 26.14.  Patient reports 4-5 month history of dysphagia.  Patient currently drinking liquids and soft, moist, foods.  He pured most of his foods to be smooth.  He drinks one Glucerna daily in the morning with his coffee.  Dietary recall reveals patient is eating small amounts frequently throughout the day.  Patient endorses approximate 10 pound weight loss.  Nutrition diagnosis: Unintentional weight loss related to new diagnosis of cancer of the GE junction as evidenced by 6% weight loss from usual body weight over 10 months.  Intervention: Patient educated to continue small frequent meals adding high-calorie, high-protein foods. Recommended patient increase Glucerna twice a day to 3 times a day. Provided oral nutrition supplement samples and coupons. Reviewed strategies for pureing foods. Fact sheets provided including increasing calories and protein, swallowing difficulties and recipes.  Monitoring, evaluation, goals: Patient will increase oral intake to promote weight maintenance.  Next visit: Will followup with patient during chemotherapy.  Patient has my contact information for questions or concerns.  **Disclaimer: This note was dictated with voice recognition software. Similar sounding words can inadvertently be transcribed and this note may contain transcription errors which may not have been corrected upon publication of note.**

## 2014-03-03 ENCOUNTER — Encounter: Payer: Self-pay | Admitting: Cardiovascular Disease

## 2014-03-03 ENCOUNTER — Ambulatory Visit (INDEPENDENT_AMBULATORY_CARE_PROVIDER_SITE_OTHER): Payer: Medicare Other | Admitting: Cardiovascular Disease

## 2014-03-03 VITALS — BP 167/52 | HR 56 | Ht 69.0 in | Wt 178.0 lb

## 2014-03-03 DIAGNOSIS — I6529 Occlusion and stenosis of unspecified carotid artery: Secondary | ICD-10-CM

## 2014-03-03 DIAGNOSIS — I739 Peripheral vascular disease, unspecified: Secondary | ICD-10-CM

## 2014-03-03 MED ORDER — FUROSEMIDE 20 MG PO TABS: 20.0000 mg | ORAL_TABLET | Freq: Every day | ORAL | Status: DC | PRN

## 2014-03-03 NOTE — Patient Instructions (Signed)
Your physician has recommended you make the following change in your medication:  1. START Furosemide 20mg  take one by mouth daily as needed for lower extremity swelling  Your physician wants you to follow-up in: 6 MONTHS with Dr Fletcher Anon.  You will receive a reminder letter in the mail two months in advance. If you don't receive a letter, please call our office to schedule the follow-up appointment.

## 2014-03-03 NOTE — Progress Notes (Signed)
HPI:  80 year old gentleman presenting for followup evaluation. The patient is followed for peripheral arterial disease. He has undergone bilateral iliac stenting in 2004 for severe intermittent claudication. He developed acute worsening of right leg and foot claudication with minimal walking in 04/2013 with an ulcer on the right second toe after breaking the skin with a small gangrenous area. He underwent  bilateral common iliac artery kissing stent placement extending into the distal aorta in 05/2013. The procedure was extremely difficult due to severe calcification. Post procedure ABI on the right side was 0.38. Duplex showed possible right external iliac artery stenosis with a peak velocity slightly above 400. Thus, I proceeded with repeat angiography which showed patent stents in the distal aorta extending into bilateral common iliac arteries. There was moderate stenosis involving the right external iliac artery with a peak gradient of less than 10 mm mercury at rest. Thus, no intervention was performed. The The ulceration on the toe is completely healed. He does have subtotal occlusion of R distal SFA/popliteal artery.  He continues to complain of bilateral calf discomfort with walking . He is able to finish walking in [employer]. Unfortunately, he was diagnosed with esophageal cancer recently.  He is having worsening bilateral leg edema.     Outpatient Encounter Prescriptions as of 03/03/2014  Medication Sig  . amLODipine (NORVASC) 10 MG tablet Take 1 tablet (10 mg total) by mouth daily.  [name] aspirin 81 MG tablet Take 1 tablet (81 mg total) by mouth daily.  . cloNIDine (CATAPRES) 0.3 MG tablet Take 0.6 mg by mouth 2 (two) times daily.  . clopidogrel (PLAVIX) 75 MG tablet Take 75 mg by mouth daily.   . hydrALAZINE (APRESOLINE) 50 MG tablet Take 1.5 tablets (75 mg total) by mouth 3 (three) times daily.  . Insulin Lispro Prot & Lispro (HUMALOG MIX 50/50 PEN [place]) Inject 15 Units into the skin 2  (two) times daily.   . pregabalin (LYRICA) 75 MG capsule Take 75 mg by mouth 3 (three) times daily.   . traMADol (ULTRAM) 50 MG tablet Take 1 tablet (50 mg total) by mouth every 12 (twelve) hours as needed for moderate pain.    Allergies  Allergen Reactions  . Contrast Media [Iodinated Diagnostic Agents]     History of renal failure following contrast requiring temporary dialysis  . Aliskiren Fumarate     REACTION: Reaction not known  . Doxazosin Mesylate     Unknown.   . Metformin And Related     "Metformin will kill me."    . Metformin Hcl Other (See Comments)    Renal Failure  . Aliskiren Rash  . Doxazosin Mesylate Rash    Past Medical History  Diagnosis Date  . Carotid stenosis     u/s 5/11: R 0-39% L 40-59% (stable)  . HTN (hypertension)     refractory. unable to cardura in past due to hypotension  . Edema   . PAD (peripheral artery disease)   . Cerebrovascular disease, unspecified   . HLD (hyperlipidemia)   . Bradycardia   . Diabetes mellitus type II   . Hyperkalemia   . Shortness of breath   . Arthritis     " IN MY HANDS "  . Chronic kidney disease, stage III (moderate)   . Stroke     ROS: Negative except as per HPI  BP 167/52  Pulse 56  Ht 5\' 9"  (1.753 m)  Wt 178 lb (80.74 kg)  BMI 26.27 kg/m2  PHYSICAL EXAM: Pt is alert and oriented, NAD HEENT: normal Neck: JVP - normal, carotids 2+= with bilateral bruits Lungs: CTA bilaterally CV: Bradycardic and regular with a soft systolic murmur over the left sternal border Abd: soft, NT, Positive BS, no hepatomegaly Ext: Femoral pulses are 1+ on the right side and +2 on the left side . Pedal pulses are nonpalpable bilaterally. There is 1+ edema bilaterally. Skin: There is mild erythema of the right foot with brawny edema .   ASSESSMENT AND PLAN: 1. Lower extremity peripheral arterial disease : post bilateral iliac stenting. He now has bilateral severe calf claudication. There is also subtotal occlusion of the  right SFA and and occluded right popliteal artery.  Endovascular intervention is limited by severely calcified vessels, the need for antegrade access via the right femoral artery, chronic kidney disease and severely uncontrolled hypertension.  Reserve revascularization for critical limb ischemia.     2. hypertension: Blood pressure is chronically elevated likely due to severely calcified vessels appeared.   3. Leg edema: worse than before. I gave Lasix 20 mg once daily prn.   4. Esophageal cancer: has not started treatment yet.   [name]

## 2014-03-04 ENCOUNTER — Ambulatory Visit (HOSPITAL_COMMUNITY)
Admission: RE | Admit: 2014-03-04 | Discharge: 2014-03-04 | Disposition: A | Discharge status: Home / Self Care | Payer: Medicare Other | Source: Ambulatory Visit | Attending: Diagnostic Radiology | Admitting: Diagnostic Radiology

## 2014-03-04 ENCOUNTER — Ambulatory Visit (HOSPITAL_BASED_OUTPATIENT_CLINIC_OR_DEPARTMENT_OTHER): Payer: Medicare Other | Admitting: Oncology

## 2014-03-04 ENCOUNTER — Other Ambulatory Visit (HOSPITAL_BASED_OUTPATIENT_CLINIC_OR_DEPARTMENT_OTHER): Payer: Medicare Other

## 2014-03-04 ENCOUNTER — Telehealth: Payer: Self-pay | Admitting: *Deleted

## 2014-03-04 ENCOUNTER — Telehealth: Payer: Self-pay | Admitting: Oncology

## 2014-03-04 VITALS — BP 151/45 | HR 69 | Temp 98.0°F | Resp 18 | Ht 69.0 in | Wt 174.6 lb

## 2014-03-04 DIAGNOSIS — R1310 Dysphagia, unspecified: Secondary | ICD-10-CM

## 2014-03-04 DIAGNOSIS — I10 Essential (primary) hypertension: Secondary | ICD-10-CM

## 2014-03-04 DIAGNOSIS — C772 Secondary and unspecified malignant neoplasm of intra-abdominal lymph nodes: Secondary | ICD-10-CM | POA: Insufficient documentation

## 2014-03-04 DIAGNOSIS — C160 Malignant neoplasm of cardia: Secondary | ICD-10-CM | POA: Insufficient documentation

## 2014-03-04 DIAGNOSIS — E1142 Type 2 diabetes mellitus with diabetic polyneuropathy: Secondary | ICD-10-CM

## 2014-03-04 DIAGNOSIS — N189 Chronic kidney disease, unspecified: Secondary | ICD-10-CM

## 2014-03-04 DIAGNOSIS — R918 Other nonspecific abnormal finding of lung field: Secondary | ICD-10-CM | POA: Diagnosis not present

## 2014-03-04 DIAGNOSIS — C786 Secondary malignant neoplasm of retroperitoneum and peritoneum: Secondary | ICD-10-CM

## 2014-03-04 DIAGNOSIS — J984 Other disorders of lung: Secondary | ICD-10-CM | POA: Diagnosis not present

## 2014-03-04 DIAGNOSIS — J929 Pleural plaque without asbestos: Secondary | ICD-10-CM | POA: Diagnosis not present

## 2014-03-04 DIAGNOSIS — E875 Hyperkalemia: Secondary | ICD-10-CM

## 2014-03-04 DIAGNOSIS — C159 Malignant neoplasm of esophagus, unspecified: Secondary | ICD-10-CM

## 2014-03-04 DIAGNOSIS — I739 Peripheral vascular disease, unspecified: Secondary | ICD-10-CM

## 2014-03-04 DIAGNOSIS — R188 Other ascites: Secondary | ICD-10-CM | POA: Diagnosis not present

## 2014-03-04 DIAGNOSIS — C787 Secondary malignant neoplasm of liver and intrahepatic bile duct: Secondary | ICD-10-CM | POA: Diagnosis not present

## 2014-03-04 LAB — GLUCOSE, CAPILLARY: GLUCOSE-CAPILLARY: 124 mg/dL — AB (ref 70–99)

## 2014-03-04 IMAGING — PT NM PET TUM IMG INITIAL (PI) SKULL BASE T - THIGH
2 series · 18 of 25 positions shown · non-contrast
Comparison: CT abdomen pelvis dated 01/30/2014

CLINICAL DATA: Initial treatment strategy for newly diagnosed GE
junction adenocarcinoma. Possible metastatic disease involving the
liver.

EXAM:
NUCLEAR MEDICINE PET SKULL BASE TO THIGH
TECHNIQUE: 8.74 mCi F-18 FDG was injected intravenously. Full-ring PET imaging
was performed from the skull base to thigh after the radiotracer. CT
data was obtained and used for attenuation correction and anatomic
localization.
FASTING BLOOD GLUCOSE:  Value: 3316 mg/dl

[Series 607: range-ct sk_thigh 5.0 (id)<alpha range(1)> · 17 of 223 slices shown]
[im 1/223]
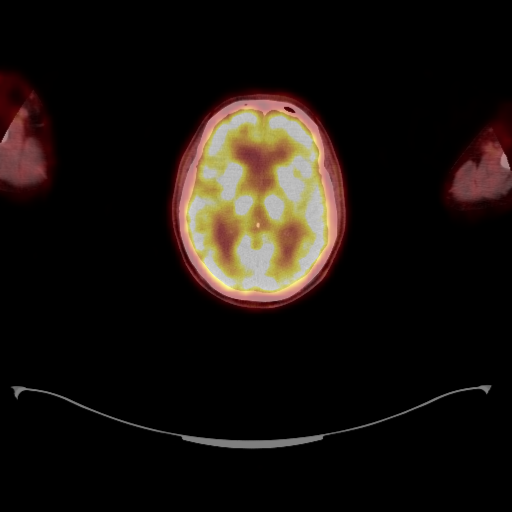
[im 20/223]
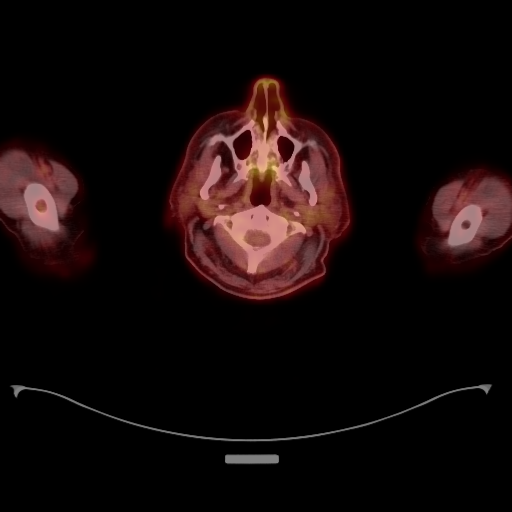
[im 29/223]
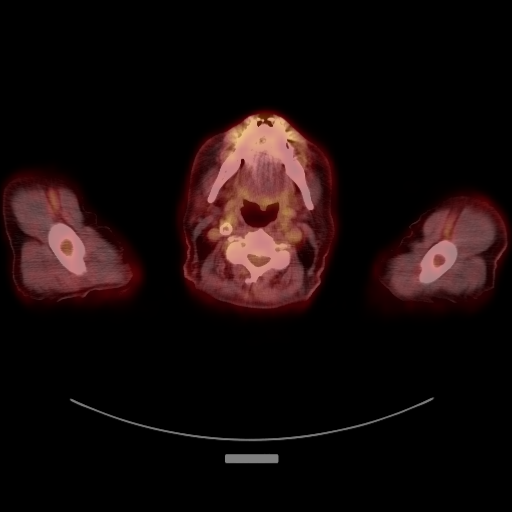
[im 39/223]
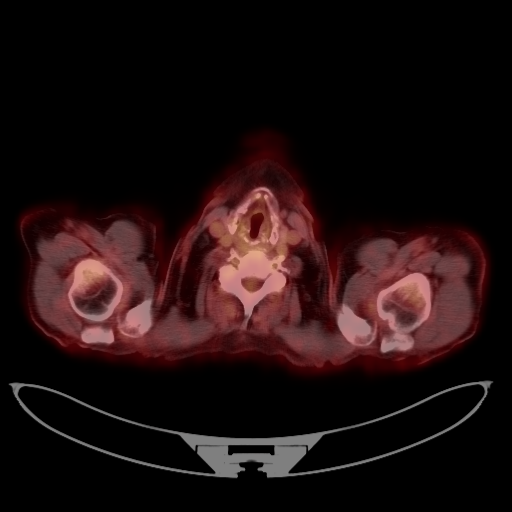
[im 58/223]
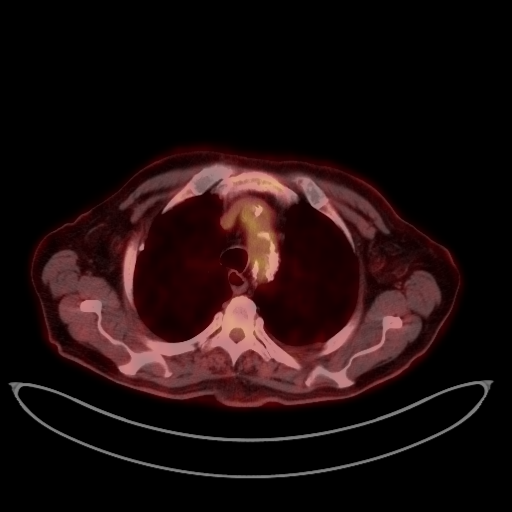
[im 68/223]
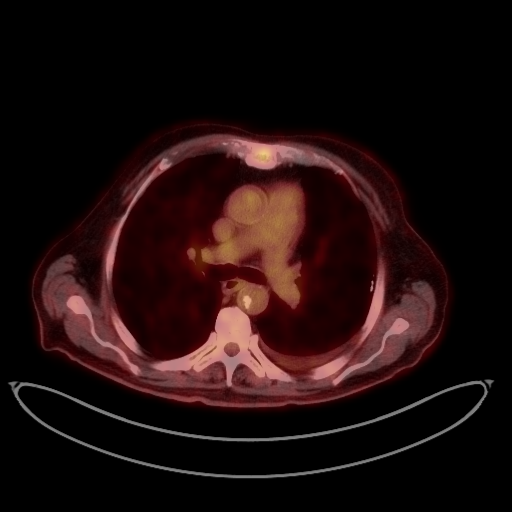
[im 87/223]
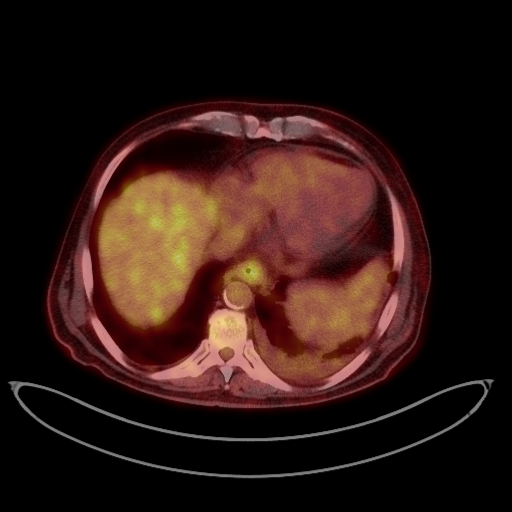
[im 97/223]
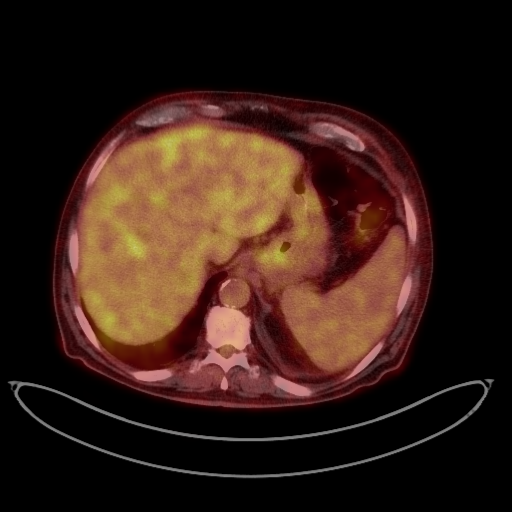
[im 107/223]
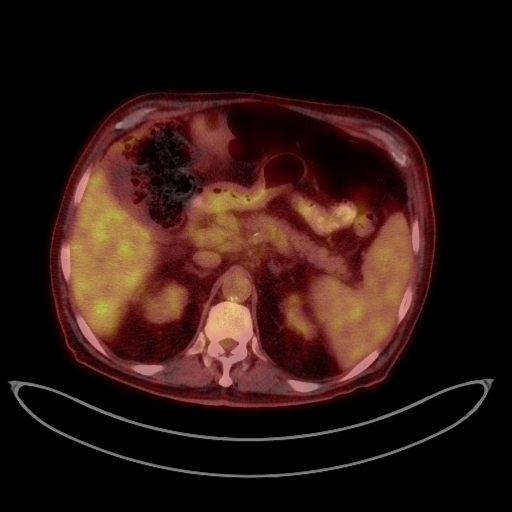
[im 126/223]
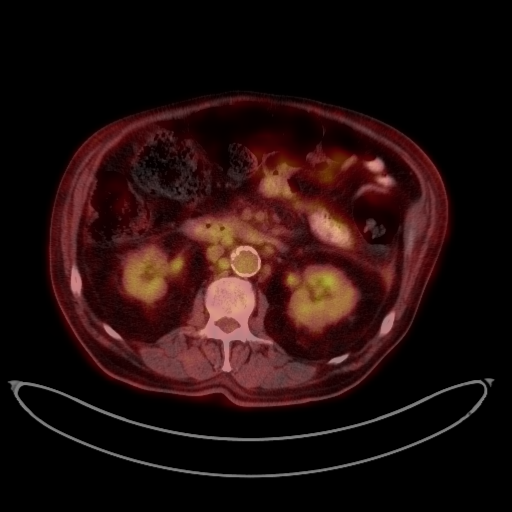
[im 136/223]
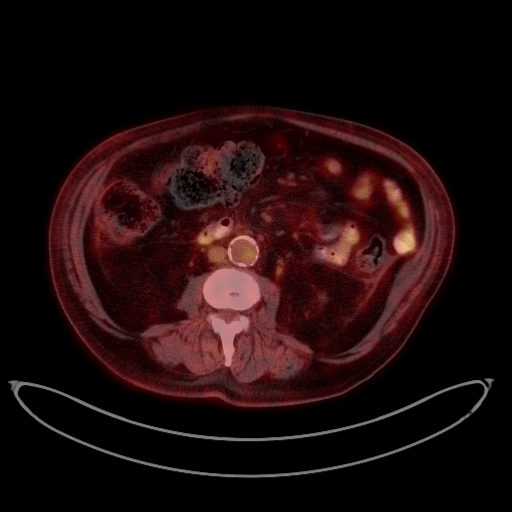
[im 145/223]
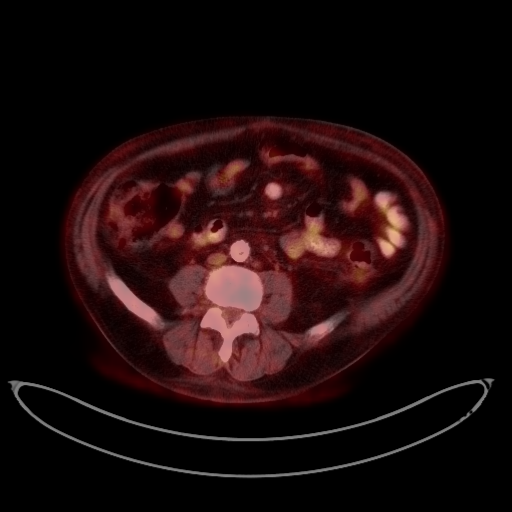
[im 165/223]
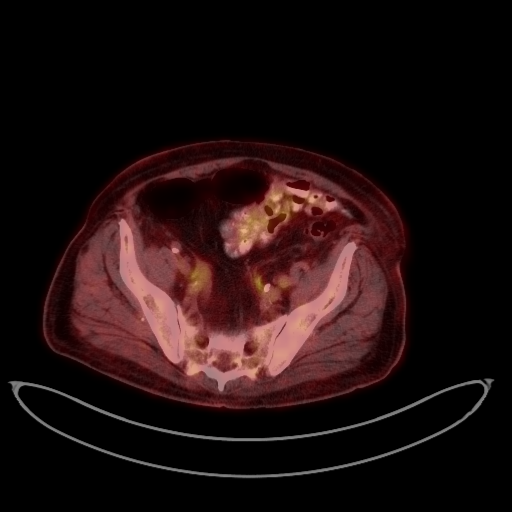
[im 174/223]
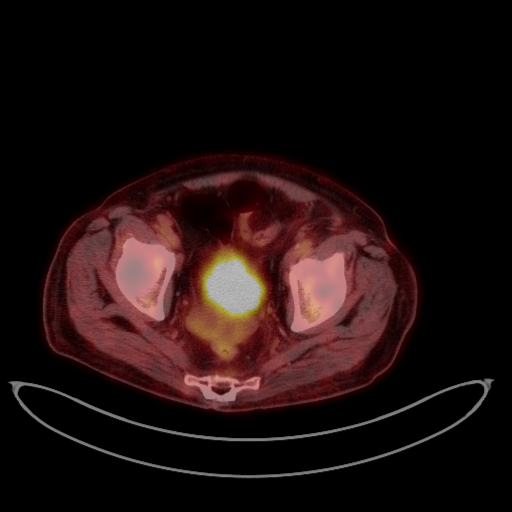
[im 194/223]
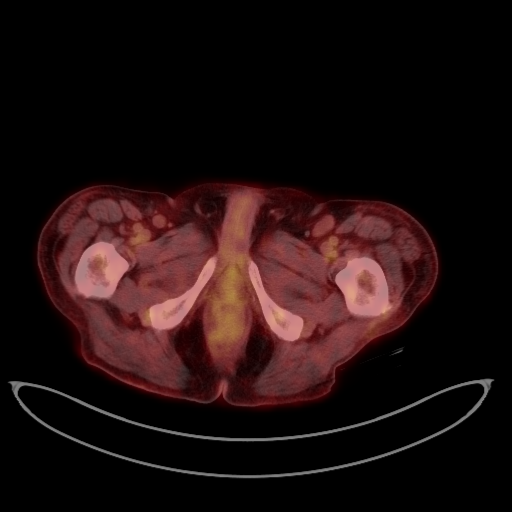
[im 203/223]
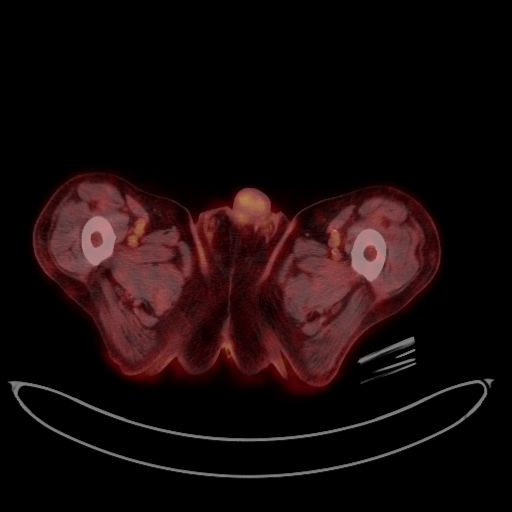
[im 213/223]
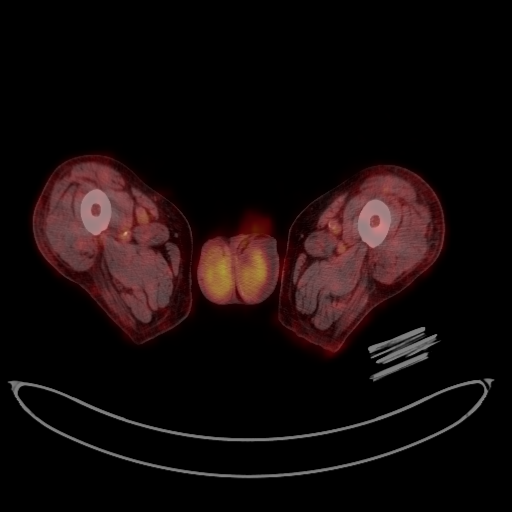

[Series 1034: results mm oncology reading · 0.93mm/px · 1 of 5 slices shown]
[im 1/5]
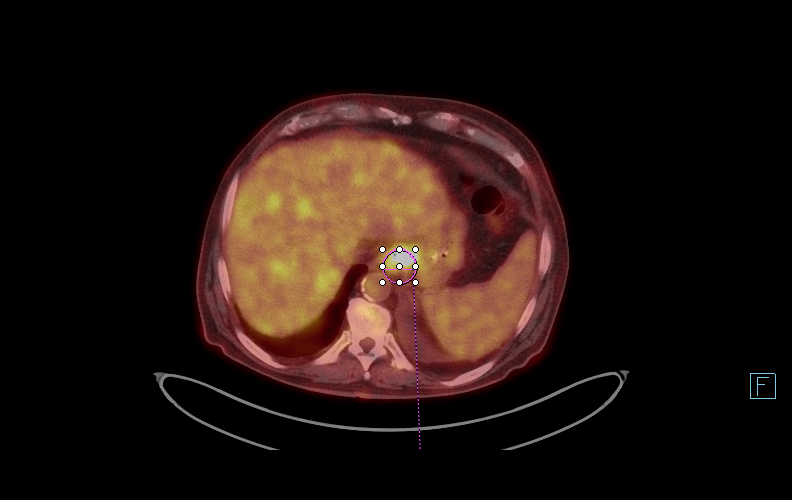

[18 of 25 positions shown; findings below may reference images not displayed]

FINDINGS: NECK

No hypermetabolic lymph nodes in the neck.

CHEST

Evaluation of lung parenchyma is constrained by respiratory motion.

Scattered tiny 3-4 mm pulmonary nodules in the bilateral lower lobes
(series 6/images 34, 41, and 43), indeterminate.

Mild focal/patchy opacity in the left lower lobe (series 6/ image
44), non FDG avid, possibly atelectasis. Small left pleural
effusion. Calcified pleural plaques.

ABDOMEN/PELVIS

Hypermetabolic soft tissue mass at the GE junction (series 4/image
103), corresponding to known GE junction adenocarcinoma, max SUV
7.9.

Numerous hypermetabolic metastases throughout the liver, including a
lesion with max SUV 5.2 in the posterior segment right hepatic lobe
(PET image 95) and max SUV 4.3 in the lateral segment left hepatic
lobe (PET image 105).

No abnormal hypermetabolic activity within the pancreas, adrenal
glands, or spleen.

Upper abdominal/retroperitoneal nodes, including a right
para-aortic/retrocaval node measuring up to 9 mm (series 4/ image
133), max SUV 4.2.

Small to moderate abdominopelvic ascites. Although worrisome for
peritoneal involvement, there is no gross peritoneal disease/omental
caking.

Anterior bladder wall thickening.

Ectasia of the infrarenal abdominal aorta measuring up to 2.6 cm.

SKELETON

No focal hypermetabolic activity to suggest skeletal metastasis.
IMPRESSION: Hypermetabolic soft tissue mass at the junction, corresponding to
known GE junction adenocarcinoma.

Numerous hypermetabolic liver metastases.

Upper abdominal/retroperitoneal nodal metastases.

Small to moderate abdominopelvic ascites.

Scattered small bilateral pulmonary nodules measuring 3-4 mm,
indeterminate. Small left pleural effusion.

Mild focal/patchy left lower lobe opacity, non FDG avid, possibly
atelectasis. Calcified pleural plaques.

## 2014-03-04 MED ORDER — FLUDEOXYGLUCOSE F - 18 (FDG) INJECTION
8.7400 | Freq: Once | INTRAVENOUS | Status: AC | PRN
Start: 2014-03-04 — End: 2014-03-04
  Administered 2014-03-04: 8.74 via INTRAVENOUS

## 2014-03-04 NOTE — Progress Notes (Signed)
[facility] OFFICE PROGRESS NOTE   Diagnosis: Gastroesophageal carcinoma  INTERVAL HISTORY:   [name] returns as scheduled. He feels well. He is tolerating a regular diet. He is golfing twice weekly.  Objective:  Vital signs in last 24 hours:  Blood pressure 151/45, pulse 69, temperature 98 F (36.7 C), temperature source Oral, resp. rate 18, height $RemoveBe'5\' 9"'FTDMgqGxH$  (1.753 m), weight 174 lb 9.6 oz (79.198 kg).    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 Possible metastatic disease involving the liver.  EXAM: NUCLEAR MEDICINE PET SKULL BASE TO THIGH  TECHNIQUE: 8.74 mCi F-18 FDG was injected intravenously. Full-ring PET imaging was performed from the skull base to thigh after the radiotracer. CT data was obtained and used for attenuation correction and anatomic localization.  FASTING BLOOD GLUCOSE:  Value: 1024 mg/dl  COMPARISON:  CT abdomen pelvis dated 01/30/2014  FINDINGS: NECK  No hypermetabolic lymph nodes in the neck.  CHEST  Evaluation of lung parenchyma is constrained by respiratory motion.  Scattered tiny 3-4 mm pulmonary nodules in the bilateral lower lobes (series 6/images 34, 41, and 43), indeterminate.  Mild focal/patchy opacity in the left lower lobe (series 6/ image 44), non FDG avid, possibly atelectasis. Small left pleural effusion. Calcified pleural plaques.  ABDOMEN/PELVIS  Hypermetabolic soft tissue mass at the GE junction (series 4/image 103), corresponding to known GE junction adenocarcinoma, max SUV 7.9.  Numerous hypermetabolic metastases throughout the liver, including a lesion with max SUV 5.2 in the posterior segment right hepatic lobe (PET image 95) and max  SUV 4.3 in the lateral segment left hepatic lobe (PET image 105).  No abnormal hypermetabolic activity within the pancreas, adrenal glands, or spleen.  Upper abdominal/retroperitoneal nodes, including a right para-aortic/retrocaval node measuring up to 9 mm (series 4/ image 133), max SUV 4.2.  Small to moderate abdominopelvic ascites. Although worrisome for peritoneal involvement, there is no gross peritoneal disease/omental caking.  Anterior bladder wall thickening.  Ectasia of the infrarenal abdominal aorta measuring up to 2.6 cm.  SKELETON  No focal hypermetabolic activity to suggest skeletal metastasis.  IMPRESSION: Hypermetabolic soft tissue mass at the junction, corresponding to known GE junction adenocarcinoma.  Numerous hypermetabolic liver metastases.  Upper abdominal/retroperitoneal nodal metastases.  Small to moderate abdominopelvic ascites.  Scattered small bilateral pulmonary nodules measuring 3-4 mm, indeterminate. Small left pleural effusion.  Mild focal/patchy left lower lobe opacity, non FDG avid, possibly atelectasis. Calcified pleural plaques.   Electronically Signed   By: Julian Hy M.D.   On: 03/04/2014 10:26    Medications: I have reviewed the patient's current medications.  Assessment/Plan: 1. GE junction adenocarcinoma EGD 01/31/2014 with an ulcerated circumferential 2-3 cm mass at the GE junction all on the esophageal side causing incomplete stricture.  Biopsy showed invasive poorly differentiated adenocarcinoma; no amplification of HER-2.  CT abdomen/pelvis 01/30/2014 showed new hypodensities in the liver concerning for metastatic disease. PET scan 03/04/2014 confirmed a hypermetabolic GE junction mass, numerous hypermetabolic liver metastases, and upper abdominal/retroperitoneal nodal metastases 2. Dysphagia secondary to #1 3. Diabetes mellitus 4. Chronic kidney disease 5. Peripheral neuropathy related to diabetes 6. Peripheral arterial  disease 7. Hypertension 8. Hyperlipidemia   Disposition:  Mr. [name] appears  stable. I reviewed the PET scan images with Mr. [name] and his daughter. He has metastatic gastroesophageal carcinoma. He understands no therapy will be curative. We discussed supportive care versus a trial of systemic therapy. He would like to proceed with chemotherapy. Treatment options are limited by his renal function and neuropathy. I recommend proceeding with the ELF regimen. The etoposide will be dose reduced. We reviewed potential toxicities associated with this chemotherapy regimen including the chance for mucositis, diarrhea, alopecia, and allergic reaction, and hematologic toxicity. We reviewed the hand/foot syndrome and hyperpigmentation seen with 5 fluorouracil. He agrees to proceed.  Mr. [name] will attend a chemotherapy teaching class. He will be referred for placement of a Port-A-Cath.  The plan is to begin a first cycle of chemotherapy 03/11/2014. He will return for an office visit and nadir CBC 03/20/2014.  Approximately 40 minutes were spent with patient today. The majority of the time was used for counseling and coordination of care.  Betsy Coder, MD  03/04/2014  12:07 PM

## 2014-03-04 NOTE — Telephone Encounter (Signed)
Per Dr. Benay Spice: OK to hold Plavix for 5 days for port placement. Tiffany in [place] notified.

## 2014-03-04 NOTE — Telephone Encounter (Signed)
Per staff message and POF I have scheduled appts. Advised scheduler of appts. JMW  

## 2014-03-04 NOTE — Telephone Encounter (Signed)
Gave avs & cal. Sent MW mess to sch tx. Also IR will call pt with d/t for appt they need order for stopping the pt on prescription.

## 2014-03-05 LAB — CEA: CEA: 138.5 ng/mL — AB (ref 0.0–5.0)

## 2014-03-06 ENCOUNTER — Telehealth: Payer: Self-pay | Admitting: Oncology

## 2014-03-06 ENCOUNTER — Other Ambulatory Visit (HOSPITAL_BASED_OUTPATIENT_CLINIC_OR_DEPARTMENT_OTHER): Payer: Medicare Other

## 2014-03-06 ENCOUNTER — Other Ambulatory Visit: Payer: Self-pay | Admitting: Radiology

## 2014-03-06 ENCOUNTER — Other Ambulatory Visit: Payer: Medicare Other

## 2014-03-06 ENCOUNTER — Other Ambulatory Visit: Payer: Self-pay | Admitting: *Deleted

## 2014-03-06 DIAGNOSIS — C160 Malignant neoplasm of cardia: Secondary | ICD-10-CM | POA: Diagnosis present

## 2014-03-06 DIAGNOSIS — C159 Malignant neoplasm of esophagus, unspecified: Secondary | ICD-10-CM

## 2014-03-06 LAB — CBC WITH DIFFERENTIAL/PLATELET
BASO%: 0.5 % (ref 0.0–2.0)
Basophils Absolute: 0 10*3/uL (ref 0.0–0.1)
EOS ABS: 0.1 10*3/uL (ref 0.0–0.5)
EOS%: 1.2 % (ref 0.0–7.0)
HEMATOCRIT: 26.9 % — AB (ref 38.4–49.9)
HGB: 8.5 g/dL — ABNORMAL LOW (ref 13.0–17.1)
LYMPH%: 19.4 % (ref 14.0–49.0)
MCH: 28.6 pg (ref 27.2–33.4)
MCHC: 31.6 g/dL — ABNORMAL LOW (ref 32.0–36.0)
MCV: 90.6 fL (ref 79.3–98.0)
MONO#: 0.4 10*3/uL (ref 0.1–0.9)
MONO%: 8.7 % (ref 0.0–14.0)
NEUT#: 3 10*3/uL (ref 1.5–6.5)
NEUT%: 70.2 % (ref 39.0–75.0)
PLATELETS: 119 10*3/uL — AB (ref 140–400)
RBC: 2.97 10*6/uL — ABNORMAL LOW (ref 4.20–5.82)
RDW: 15.9 % — AB (ref 11.0–14.6)
WBC: 4.3 10*3/uL (ref 4.0–10.3)
lymph#: 0.8 10*3/uL — ABNORMAL LOW (ref 0.9–3.3)

## 2014-03-06 LAB — COMPREHENSIVE METABOLIC PANEL (CC13)
ALBUMIN: 2.8 g/dL — AB (ref 3.5–5.0)
ALT: 19 U/L (ref 0–55)
AST: 25 U/L (ref 5–34)
Alkaline Phosphatase: 141 U/L (ref 40–150)
Anion Gap: 7 mEq/L (ref 3–11)
BUN: 22.3 mg/dL (ref 7.0–26.0)
CO2: 24 mEq/L (ref 22–29)
Calcium: 8.6 mg/dL (ref 8.4–10.4)
Chloride: 109 mEq/L (ref 98–109)
Creatinine: 1.5 mg/dL — ABNORMAL HIGH (ref 0.7–1.3)
GLUCOSE: 125 mg/dL (ref 70–140)
POTASSIUM: 4.8 meq/L (ref 3.5–5.1)
Sodium: 140 mEq/L (ref 136–145)
Total Bilirubin: 0.32 mg/dL (ref 0.20–1.20)
Total Protein: 5.5 g/dL — ABNORMAL LOW (ref 6.4–8.3)

## 2014-03-06 LAB — CEA: CEA: 129.9 ng/mL — ABNORMAL HIGH (ref 0.0–5.0)

## 2014-03-06 NOTE — Telephone Encounter (Signed)
Confirm appt d/t for 03/11/14 @ 10:30 w/ lab.

## 2014-03-08 ENCOUNTER — Other Ambulatory Visit: Payer: Self-pay | Admitting: Oncology

## 2014-03-09 ENCOUNTER — Encounter (HOSPITAL_COMMUNITY): Payer: Self-pay

## 2014-03-09 ENCOUNTER — Other Ambulatory Visit: Payer: Self-pay | Admitting: Oncology

## 2014-03-09 ENCOUNTER — Ambulatory Visit (HOSPITAL_COMMUNITY)
Admission: RE | Admit: 2014-03-09 | Discharge: 2014-03-09 | Disposition: A | Discharge status: Home / Self Care | Payer: Medicare Other | Source: Ambulatory Visit | Attending: Oncology | Admitting: Oncology

## 2014-03-09 DIAGNOSIS — I679 Cerebrovascular disease, unspecified: Secondary | ICD-10-CM | POA: Insufficient documentation

## 2014-03-09 DIAGNOSIS — Z7902 Long term (current) use of antithrombotics/antiplatelets: Secondary | ICD-10-CM | POA: Insufficient documentation

## 2014-03-09 DIAGNOSIS — C159 Malignant neoplasm of esophagus, unspecified: Secondary | ICD-10-CM

## 2014-03-09 DIAGNOSIS — Z7982 Long term (current) use of aspirin: Secondary | ICD-10-CM | POA: Diagnosis not present

## 2014-03-09 DIAGNOSIS — I129 Hypertensive chronic kidney disease with stage 1 through stage 4 chronic kidney disease, or unspecified chronic kidney disease: Secondary | ICD-10-CM | POA: Insufficient documentation

## 2014-03-09 DIAGNOSIS — Z79899 Other long term (current) drug therapy: Secondary | ICD-10-CM | POA: Insufficient documentation

## 2014-03-09 DIAGNOSIS — E119 Type 2 diabetes mellitus without complications: Secondary | ICD-10-CM | POA: Insufficient documentation

## 2014-03-09 DIAGNOSIS — Z452 Encounter for adjustment and management of vascular access device: Secondary | ICD-10-CM | POA: Insufficient documentation

## 2014-03-09 DIAGNOSIS — E785 Hyperlipidemia, unspecified: Secondary | ICD-10-CM | POA: Insufficient documentation

## 2014-03-09 DIAGNOSIS — C160 Malignant neoplasm of cardia: Secondary | ICD-10-CM | POA: Insufficient documentation

## 2014-03-09 DIAGNOSIS — C799 Secondary malignant neoplasm of unspecified site: Secondary | ICD-10-CM | POA: Diagnosis not present

## 2014-03-09 DIAGNOSIS — I739 Peripheral vascular disease, unspecified: Secondary | ICD-10-CM | POA: Diagnosis not present

## 2014-03-09 DIAGNOSIS — Z87891 Personal history of nicotine dependence: Secondary | ICD-10-CM | POA: Insufficient documentation

## 2014-03-09 DIAGNOSIS — N183 Chronic kidney disease, stage 3 (moderate): Secondary | ICD-10-CM | POA: Insufficient documentation

## 2014-03-09 HISTORY — DX: Malignant (primary) neoplasm, unspecified: C80.1

## 2014-03-09 LAB — GLUCOSE, CAPILLARY
GLUCOSE-CAPILLARY: 78 mg/dL (ref 70–99)
Glucose-Capillary: 68 mg/dL — ABNORMAL LOW (ref 70–99)

## 2014-03-09 LAB — CBC WITH DIFFERENTIAL/PLATELET
BASOS ABS: 0 10*3/uL (ref 0.0–0.1)
Basophils Relative: 1 % (ref 0–1)
Eosinophils Absolute: 0 10*3/uL (ref 0.0–0.7)
Eosinophils Relative: 1 % (ref 0–5)
HCT: 26.4 % — ABNORMAL LOW (ref 39.0–52.0)
HEMOGLOBIN: 8.4 g/dL — AB (ref 13.0–17.0)
Lymphocytes Relative: 24 % (ref 12–46)
Lymphs Abs: 0.9 10*3/uL (ref 0.7–4.0)
MCH: 28.3 pg (ref 26.0–34.0)
MCHC: 31.8 g/dL (ref 30.0–36.0)
MCV: 88.9 fL (ref 78.0–100.0)
MONOS PCT: 11 % (ref 3–12)
Monocytes Absolute: 0.4 10*3/uL (ref 0.1–1.0)
NEUTROS ABS: 2.5 10*3/uL (ref 1.7–7.7)
NEUTROS PCT: 63 % (ref 43–77)
Platelets: 134 10*3/uL — ABNORMAL LOW (ref 150–400)
RBC: 2.97 MIL/uL — ABNORMAL LOW (ref 4.22–5.81)
RDW: 15.2 % (ref 11.5–15.5)
WBC: 3.9 10*3/uL — AB (ref 4.0–10.5)

## 2014-03-09 LAB — PROTIME-INR
INR: 1.11 (ref 0.00–1.49)
Prothrombin Time: 14.5 seconds (ref 11.6–15.2)

## 2014-03-09 LAB — APTT: APTT: 44 s — AB (ref 24–37)

## 2014-03-09 MED ORDER — FENTANYL CITRATE 0.05 MG/ML IJ SOLN
INTRAMUSCULAR | Status: AC | PRN
  Administered 2014-03-09: 50 ug via INTRAVENOUS

## 2014-03-09 MED ORDER — MIDAZOLAM HCL 2 MG/2ML IJ SOLN
INTRAMUSCULAR | Status: AC | PRN
  Administered 2014-03-09: 1 mg via INTRAVENOUS

## 2014-03-09 MED ORDER — CEFAZOLIN SODIUM-DEXTROSE 2-3 GM-% IV SOLR
2.0000 g | INTRAVENOUS | Status: AC
  Administered 2014-03-09: 2 g via INTRAVENOUS

## 2014-03-09 MED ORDER — LIDOCAINE HCL 1 % IJ SOLN
INTRAMUSCULAR | Status: AC
  Filled 2014-03-09: qty 20

## 2014-03-09 MED ORDER — HEPARIN SOD (PORK) LOCK FLUSH 100 UNIT/ML IV SOLN
INTRAVENOUS | Status: AC
  Filled 2014-03-09: qty 5

## 2014-03-09 MED ORDER — FENTANYL CITRATE 0.05 MG/ML IJ SOLN
INTRAMUSCULAR | Status: AC
  Filled 2014-03-09: qty 6

## 2014-03-09 MED ORDER — DEXTROSE-NACL 5-0.45 % IV SOLN
INTRAVENOUS | Status: DC
  Administered 2014-03-09: 13:00:00 via INTRAVENOUS

## 2014-03-09 MED ORDER — HEPARIN SOD (PORK) LOCK FLUSH 100 UNIT/ML IV SOLN
INTRAVENOUS | Status: AC | PRN
  Administered 2014-03-09: 500 [IU]

## 2014-03-09 MED ORDER — SODIUM CHLORIDE 0.9 % IV SOLN
INTRAVENOUS | Status: DC
Start: 2014-03-09 — End: 2014-03-10
  Administered 2014-03-09: 13:00:00 via INTRAVENOUS

## 2014-03-09 MED ORDER — CEFAZOLIN SODIUM-DEXTROSE 2-3 GM-% IV SOLR
INTRAVENOUS | Status: AC
  Filled 2014-03-09: qty 50

## 2014-03-09 MED ORDER — MIDAZOLAM HCL 2 MG/2ML IJ SOLN
INTRAMUSCULAR | Status: AC
  Filled 2014-03-09: qty 6

## 2014-03-09 MED ORDER — DEXTROSE 5 % IV SOLN: INTRAVENOUS | Status: DC

## 2014-03-09 NOTE — Procedures (Signed)
RIJV PAC Tip SVC RA No comp 

## 2014-03-09 NOTE — Discharge Instructions (Signed)
Implanted Port Insertion, Care After °Refer to this sheet in the next few weeks. These instructions provide you with information on caring for yourself after your procedure. Your health care provider may also give you more specific instructions. Your treatment has been planned according to current medical practices, but problems sometimes occur. Call your health care provider if you have any problems or questions after your procedure. °WHAT TO EXPECT AFTER THE PROCEDURE °After your procedure, it is typical to have the following:  °· Discomfort at the port insertion site. Ice packs to the area will help. °· Bruising on the skin over the port. This will subside in 3-4 days. °HOME CARE INSTRUCTIONS °· After your port is placed, you will get a manufacturer's information card. The card has information about your port. Keep this card with you at all times.   °· Know what kind of port you have. There are many types of ports available.   °· Wear a medical alert bracelet in case of an emergency. This can help alert health care workers that you have a port.   °· The port can stay in for as long as your health care provider believes it is necessary.   °· A home health care nurse may give medicines and take care of the port.   °· You or a family member can get special training and directions for giving medicine and taking care of the port at home.   °SEEK MEDICAL CARE IF:  °· Your port does not flush or you are unable to get a blood return.   °· You have a fever or chills. °SEEK IMMEDIATE MEDICAL CARE IF: °· You have new fluid or pus coming from your incision.   °· You notice a bad smell coming from your incision site.   °· You have swelling, pain, or more redness at the incision or port site.   °· You have chest pain or shortness of breath. °Document Released: 02/12/2013 Document Revised: 04/29/2013 Document Reviewed: 02/12/2013 °Exit[employer]® Patient Information ©2015 [employer]. This information is not intended to replace  advice given to you by your health care provider. Make sure you discuss any questions you have with your health care provider. °Implanted Port Home Guide °An implanted port is a type of central line that is placed under the skin. Central lines are used to provide IV access when treatment or nutrition needs to be given through a person's veins. Implanted ports are used for long-term IV access. An implanted port may be placed because:  °· You need IV medicine that would be irritating to the small veins in your hands or arms.   °· You need long-term IV medicines, such as antibiotics.   °· You need IV nutrition for a long period.   °· You need frequent blood draws for lab tests.   °· You need dialysis.   °Implanted ports are usually placed in the chest area, but they can also be placed in the upper arm, the abdomen, or the leg. An implanted port has two main parts:  °· Reservoir. The reservoir is round and will appear as a small, raised area under your skin. The reservoir is the part where a needle is inserted to give medicines or draw blood.   °· Catheter. The catheter is a thin, flexible tube that extends from the reservoir. The catheter is placed into a large vein. Medicine that is inserted into the reservoir goes into the catheter and then into the vein.   °HOW WILL I CARE FOR MY INCISION SITE? °Do not get the incision site wet. Bathe or   shower as directed by your health care provider.  °HOW IS MY PORT ACCESSED? °Special steps must be taken to access the port:  °· Before the port is accessed, a numbing cream can be placed on the skin. This helps numb the skin over the port site.   °· Your health care provider uses a sterile technique to access the port. °· Your health care provider must put on a mask and sterile gloves. °· The skin over your port is cleaned carefully with an antiseptic and allowed to dry. °· The port is gently pinched between sterile gloves, and a needle is inserted into the port. °· Only  "non-coring" port needles should be used to access the port. Once the port is accessed, a blood return should be checked. This helps ensure that the port is in the vein and is not clogged.   °· If your port needs to remain accessed for a constant infusion, a clear (transparent) bandage will be placed over the needle site. The bandage and needle will need to be changed every week, or as directed by your health care provider.   °· Keep the bandage covering the needle clean and dry. Do not get it wet. Follow your health care provider's instructions on how to take a shower or bath while the port is accessed.   °· If your port does not need to stay accessed, no bandage is needed over the port.   °WHAT IS FLUSHING? °Flushing helps keep the port from getting clogged. Follow your health care provider's instructions on how and when to flush the port. Ports are usually flushed with saline solution or a medicine called heparin. The need for flushing will depend on how the port is used.  °· If the port is used for intermittent medicines or blood draws, the port will need to be flushed:   °· After medicines have been given.   °· After blood has been drawn.   °· As part of routine maintenance.   °· If a constant infusion is running, the port may not need to be flushed.   °HOW LONG WILL MY PORT STAY IMPLANTED? °The port can stay in for as long as your health care provider thinks it is needed. When it is time for the port to come out, surgery will be done to remove it. The procedure is similar to the one performed when the port was put in.  °WHEN SHOULD I SEEK IMMEDIATE MEDICAL CARE? °When you have an implanted port, you should seek immediate medical care if:  °· You notice a bad smell coming from the incision site.   °· You have swelling, redness, or drainage at the incision site.   °· You have more swelling or pain at the port site or the surrounding area.   °· You have a fever that is not controlled with medicine. °Document  Released: 04/24/2005 Document Revised: 02/12/2013 Document Reviewed: 12/30/2012 °ExitCare® Patient Information ©2015 [employer]. This information is not intended to replace advice given to you by your health care provider. Make sure you discuss any questions you have with your health care provider. °Conscious Sedation °Sedation is the use of medicines to promote relaxation and relieve discomfort and anxiety. Conscious sedation is a type of sedation. Under conscious sedation you are less alert than normal but are still able to respond to instructions or stimulation. Conscious sedation is used during short medical and dental procedures. It is milder than deep sedation or general anesthesia and allows you to return to your regular activities sooner.  °LET YOUR HEALTH CARE PROVIDER   KNOW ABOUT:  °· Any allergies you have. °· All medicines you are taking, including vitamins, herbs, eye drops, creams, and over-the-counter medicines. °· Use of steroids (by mouth or creams). °· Previous problems you or members of your family have had with the use of anesthetics. °· Any blood disorders you have. °· Previous surgeries you have had. °· Medical conditions you have. °· Possibility of pregnancy, if this applies. °· Use of cigarettes, alcohol, or illegal drugs. °RISKS AND COMPLICATIONS °Generally, this is a safe procedure. However, as with any procedure, problems can occur. Possible problems include: °· Oversedation. °· Trouble breathing on your own. You may need to have a breathing tube until you are awake and breathing on your own. °· Allergic reaction to any of the medicines used for the procedure. °BEFORE THE PROCEDURE °· You may have blood tests done. These tests can help show how well your kidneys and liver are working. They can also show how well your blood clots. °· A physical exam will be done.   °· Only take medicines as directed by your health care provider. You may need to stop taking medicines (such as blood  thinners, aspirin, or nonsteroidal anti-inflammatory drugs) before the procedure.   °· Do not eat or drink at least 6 hours before the procedure or as directed by your health care provider. °· Arrange for a responsible adult, family member, or friend to take you home after the procedure. He or she should stay with you for at least 24 hours after the procedure, until the medicine has worn off. °PROCEDURE  °· An intravenous (IV) catheter will be inserted into one of your veins. Medicine will be able to flow directly into your body through this catheter. You may be given medicine through this tube to help prevent pain and help you relax. °· The medical or dental procedure will be done. °AFTER THE PROCEDURE °· You will stay in a recovery area until the medicine has worn off. Your blood pressure and pulse will be checked.   °·  Depending on the procedure you had, you may be allowed to go home when you can tolerate liquids and your pain is under control. °Document Released: 01/17/2001 Document Revised: 04/29/2013 Document Reviewed: 12/30/2012 °ExitCare® Patient Information ©2015 [employer]. This information is not intended to replace advice given to you by your health care provider. Make sure you discuss any questions you have with your health care provider. °Conscious Sedation, Adult, Care After °Refer to this sheet in the next few weeks. These instructions provide you with information on caring for yourself after your procedure. Your health care provider may also give you more specific instructions. Your treatment has been planned according to current medical practices, but problems sometimes occur. Call your health care provider if you have any problems or questions after your procedure. °WHAT TO EXPECT AFTER THE PROCEDURE  °After your procedure: °· You may feel sleepy, clumsy, and have poor balance for several hours. °· Vomiting may occur if you eat too soon after the procedure. °HOME CARE INSTRUCTIONS °· Do not  participate in any activities where you could become injured for at least 24 hours. Do not: °¨ Drive. °¨ Swim. °¨ Ride a bicycle. °¨ Operate heavy machinery. °¨ Cook. °¨ Use power tools. °¨ Climb ladders. °¨ Work from a high place. °· Do not make important decisions or sign legal documents until you are improved. °· If you vomit, drink water, juice, or soup when you can drink without vomiting. Make sure you have little   or no nausea before eating solid foods. °· Only take over-the-counter or prescription medicines for pain, discomfort, or fever as directed by your health care provider. °· Make sure you and your family fully understand everything about the medicines given to you, including what side effects may occur. °· You should not drink alcohol, take sleeping pills, or take medicines that cause drowsiness for at least 24 hours. °· If you smoke, do not smoke without supervision. °· If you are feeling better, you may resume normal activities 24 hours after you were sedated. °· Keep all appointments with your health care provider. °SEEK MEDICAL CARE IF: °· Your skin is pale or bluish in color. °· You continue to feel nauseous or vomit. °· Your pain is getting worse and is not helped by medicine. °· You have bleeding or swelling. °· You are still sleepy or feeling clumsy after 24 hours. °SEEK IMMEDIATE MEDICAL CARE IF: °· You develop a rash. °· You have difficulty breathing. °· You develop any type of allergic problem. °· You have a fever. °MAKE SURE YOU: °· Understand these instructions. °· Will watch your condition. °· Will get help right away if you are not doing well or get worse. °Document Released: 02/12/2013 Document Reviewed: 02/12/2013 °ExitCare® Patient Information ©2015 [employer]. This information is not intended to replace advice given to you by your health care provider. Make sure you discuss any questions you have with your health care provider. ° °

## 2014-03-09 NOTE — H&P (Signed)
Chief Complaint: "I'm getting a port a cath"  Referring Physician(s): Sherrill,Gary B  History of Present Illness: [name] is a 80 y.o. male with history of metastatic GE junction adenocarcinoma who presents today for port a cath placement for chemotherapy.   Past Medical History  Diagnosis Date  . Carotid stenosis     u/s 5/11: R 0-39% L 40-59% (stable)  . HTN (hypertension)     refractory. unable to cardura in past due to hypotension  . Edema   . PAD (peripheral artery disease)   . Cerebrovascular disease, unspecified   . HLD (hyperlipidemia)   . Bradycardia   . Diabetes mellitus type II   . Hyperkalemia   . Shortness of breath   . Arthritis     " IN MY HANDS "  . Chronic kidney disease, stage III (moderate)   . Stroke     Past Surgical History  Procedure Laterality Date  . Carotid endarterectomy  1/01    right  . Balloon angioplasty, artery Bilateral 04/09/2013    LEFT ILIAC & RT COMMON ILIAC     DR ARIDA    . Femoral artery stent  ?   2010    DR COOPER  . Esophagogastroduodenoscopy N/A 01/31/2014    Procedure: ESOPHAGOGASTRODUODENOSCOPY (EGD);  Surgeon: Milus Banister, MD;  Location: [facility];  Service: Endoscopy;  Laterality: N/A;    Allergies: Contrast media; Aliskiren fumarate; Doxazosin mesylate; Metformin and related; Metformin hcl; Aliskiren; and Doxazosin mesylate  Medications: Prior to Admission medications   Medication Sig Start Date End Date Taking? Authorizing Provider  amLODipine (NORVASC) 10 MG tablet Take 1 tablet (10 mg total) by mouth daily. 10/15/12   Amy D Ninfa Meeker, NP  aspirin 81 MG tablet Take 1 tablet (81 mg total) by mouth daily. 04/11/13   Rhonda G Barrett, PA-C  cloNIDine (CATAPRES) 0.3 MG tablet Take 0.6 mg by mouth 2 (two) times daily.    Historical Provider, MD  clopidogrel (PLAVIX) 75 MG tablet Take 75 mg by mouth daily.     Historical Provider, MD  furosemide (LASIX) 20 MG tablet Take 1 tablet (20 mg total) by mouth daily  as needed (for swelling). 03/03/14   Wellington Hampshire, MD  hydrALAZINE (APRESOLINE) 50 MG tablet Take 1.5 tablets (75 mg total) by mouth 3 (three) times daily. 08/15/13 09/19/15  Larey Dresser, MD  Insulin Lispro Prot & Lispro (HUMALOG MIX 50/50 PEN Arrey) Inject 15 Units into the skin 2 (two) times daily.     Historical Provider, MD  pregabalin (LYRICA) 75 MG capsule Take 75 mg by mouth 3 (three) times daily.     Historical Provider, MD  traMADol (ULTRAM) 50 MG tablet Take 1 tablet (50 mg total) by mouth every 12 (twelve) hours as needed for moderate pain. 02/01/14   Sheela Stack, MD    Family History  Problem Relation Age of Onset  . Coronary artery disease Father     History   Social History  . Marital Status: Married    Spouse Name: N/A    Number of Children: 4  . Years of Education: N/A   Social History Main Topics  . Smoking status: Former Smoker    Quit date: 05/08/1994  . Smokeless tobacco: Never Used  . Alcohol Use: No  . Drug Use: No  . Sexual Activity: Not on file   Other Topics Concern  . Not on file   Social History Narrative  . No narrative  on file         Review of Systems  Constitutional: Negative for fever and chills.  Respiratory: Negative for shortness of breath.   Cardiovascular: Positive for leg swelling. Negative for chest pain.  Gastrointestinal: Negative for nausea, vomiting, abdominal pain and blood in stool.       Occ dysphagia  Genitourinary: Negative for dysuria and hematuria.  Musculoskeletal: Negative for back pain.  Neurological: Negative for headaches.  Hematological: Bruises/bleeds easily.    Vital Signs: BP 163/36 mmHg  Pulse 38  Temp(Src) 98.4 F (36.9 C) (Oral)  Resp 18  Ht 5\' 9"  (1.753 m)  Wt 174 lb (78.926 kg)  BMI 25.68 kg/m2  SpO2 100%  Physical Exam  Constitutional: He appears well-developed and well-nourished.  Cardiovascular: Regular rhythm.   bradycardic  Pulmonary/Chest: Effort normal.  Dim BS bases,>  on left  Abdominal: Soft. Bowel sounds are normal. He exhibits distension. There is no tenderness.  Musculoskeletal: Normal range of motion. He exhibits edema.    Imaging: Nm Pet Image Initial (pi) Skull Base To Thigh  03/04/2014   CLINICAL DATA:  Initial treatment strategy for newly diagnosed GE junction adenocarcinoma. Possible metastatic disease involving the liver.  EXAM: NUCLEAR MEDICINE PET SKULL BASE TO THIGH  TECHNIQUE: 8.74 mCi F-18 FDG was injected intravenously. Full-ring PET imaging was performed from the skull base to thigh after the radiotracer. CT data was obtained and used for attenuation correction and anatomic localization.  FASTING BLOOD GLUCOSE:  Value: 1024 mg/dl  COMPARISON:  CT abdomen pelvis dated 01/30/2014  FINDINGS: NECK  No hypermetabolic lymph nodes in the neck.  CHEST  Evaluation of lung parenchyma is constrained by respiratory motion.  Scattered tiny 3-4 mm pulmonary nodules in the bilateral lower lobes (series 6/images 34, 41, and 43), indeterminate.  Mild focal/patchy opacity in the left lower lobe (series 6/ image 44), non FDG avid, possibly atelectasis. Small left pleural effusion. Calcified pleural plaques.  ABDOMEN/PELVIS  Hypermetabolic soft tissue mass at the GE junction (series 4/image 103), corresponding to known GE junction adenocarcinoma, max SUV 7.9.  Numerous hypermetabolic metastases throughout the liver, including a lesion with max SUV 5.2 in the posterior segment right hepatic lobe (PET image 95) and max SUV 4.3 in the lateral segment left hepatic lobe (PET image 105).  No abnormal hypermetabolic activity within the pancreas, adrenal glands, or spleen.  Upper abdominal/retroperitoneal nodes, including a right para-aortic/retrocaval node measuring up to 9 mm (series 4/ image 133), max SUV 4.2.  Small to moderate abdominopelvic ascites. Although worrisome for peritoneal involvement, there is no gross peritoneal disease/omental caking.  Anterior bladder wall  thickening.  Ectasia of the infrarenal abdominal aorta measuring up to 2.6 cm.  SKELETON  No focal hypermetabolic activity to suggest skeletal metastasis.  IMPRESSION: Hypermetabolic soft tissue mass at the junction, corresponding to known GE junction adenocarcinoma.  Numerous hypermetabolic liver metastases.  Upper abdominal/retroperitoneal nodal metastases.  Small to moderate abdominopelvic ascites.  Scattered small bilateral pulmonary nodules measuring 3-4 mm, indeterminate. Small left pleural effusion.  Mild focal/patchy left lower lobe opacity, non FDG avid, possibly atelectasis. Calcified pleural plaques.   Electronically Signed   By: Julian Hy M.D.   On: 03/04/2014 10:26    Labs:  CBC:  Recent Labs  05/21/13 0756 01/30/14 1458 02/01/14 0530 03/06/14 0852  WBC 4.5 6.5 3.9* 4.3  HGB 11.0* 12.1* 10.8* 8.5*  HCT 33.4* 37.3* 33.1* 26.9*  PLT 122* 182 139* 119*    COAGS:  Recent Labs  04/09/13 1005 05/21/13 0756  INR 1.06 1.05  APTT 34  --     BMP:  Recent Labs  05/21/13 0756 08/15/13 0939 01/30/14 1458 02/01/14 0530 03/06/14 0852  NA 143 139 139 142 140  K 4.9 5.4* 4.3 4.1 4.8  CL 105 99 97 106  --   CO2 27 27 26 23 24   GLUCOSE 78 102* 97 140* 125  BUN 32* 50* 39* 22 22.3  CALCIUM 9.0 8.8 9.4 8.5 8.6  CREATININE 1.77* 2.40* 2.15* 1.44* 1.5*  GFRNONAA 34* 24* 27* 44*  --   GFRAA 40* 28* 31* 51*  --     LIVER FUNCTION TESTS:  Recent Labs  04/21/13 1016 01/30/14 1458 02/01/14 0530 03/06/14 0852  BILITOT 0.3 0.4 0.4 0.32  AST 16 27 22 25   ALT 9 15 12 19   ALKPHOS 46 50 47 141  PROT 6.1 7.5 6.0 5.5*  ALBUMIN 2.8* 3.9 3.0* 2.8*    TUMOR MARKERS:  Recent Labs  03/04/14 1132 03/06/14 0852  CEA 138.5* 129.9*    Assessment and Plan: DIETER HANE is a 80 y.o. male with history of metastatic GE junction adenocarcinoma who presents today for port a cath placement for chemotherapy.  Details/risks of procedure d/w pt/wife with their  understanding and consent.            Signed: Autumn Messing 03/09/2014, 12:24 PM

## 2014-03-09 NOTE — Progress Notes (Addendum)
Notified Rowe Robert, PA of patient's NPO status and CBG. Order received for D51/2 fluid.

## 2014-03-10 ENCOUNTER — Telehealth: Payer: Self-pay | Admitting: *Deleted

## 2014-03-10 ENCOUNTER — Encounter (HOSPITAL_COMMUNITY): Payer: Self-pay | Admitting: Emergency Medicine

## 2014-03-10 ENCOUNTER — Emergency Department (HOSPITAL_COMMUNITY): Payer: Medicare Other

## 2014-03-10 ENCOUNTER — Emergency Department (HOSPITAL_COMMUNITY)
Admission: EM | Admit: 2014-03-10 | Discharge: 2014-03-10 | Disposition: A | Discharge status: Home / Self Care | Payer: Medicare Other | Attending: Emergency Medicine | Admitting: Emergency Medicine

## 2014-03-10 DIAGNOSIS — E119 Type 2 diabetes mellitus without complications: Secondary | ICD-10-CM | POA: Insufficient documentation

## 2014-03-10 DIAGNOSIS — T8249XA Other complication of vascular dialysis catheter, initial encounter: Secondary | ICD-10-CM | POA: Insufficient documentation

## 2014-03-10 DIAGNOSIS — Z95828 Presence of other vascular implants and grafts: Secondary | ICD-10-CM

## 2014-03-10 DIAGNOSIS — Z8501 Personal history of malignant neoplasm of esophagus: Secondary | ICD-10-CM | POA: Insufficient documentation

## 2014-03-10 DIAGNOSIS — N183 Chronic kidney disease, stage 3 (moderate): Secondary | ICD-10-CM | POA: Diagnosis not present

## 2014-03-10 DIAGNOSIS — I129 Hypertensive chronic kidney disease with stage 1 through stage 4 chronic kidney disease, or unspecified chronic kidney disease: Secondary | ICD-10-CM | POA: Diagnosis not present

## 2014-03-10 DIAGNOSIS — Z79899 Other long term (current) drug therapy: Secondary | ICD-10-CM | POA: Diagnosis not present

## 2014-03-10 DIAGNOSIS — Z7982 Long term (current) use of aspirin: Secondary | ICD-10-CM | POA: Insufficient documentation

## 2014-03-10 DIAGNOSIS — Z7902 Long term (current) use of antithrombotics/antiplatelets: Secondary | ICD-10-CM | POA: Diagnosis not present

## 2014-03-10 DIAGNOSIS — Z794 Long term (current) use of insulin: Secondary | ICD-10-CM | POA: Diagnosis not present

## 2014-03-10 DIAGNOSIS — Z8673 Personal history of transient ischemic attack (TIA), and cerebral infarction without residual deficits: Secondary | ICD-10-CM | POA: Diagnosis not present

## 2014-03-10 DIAGNOSIS — M1990 Unspecified osteoarthritis, unspecified site: Secondary | ICD-10-CM | POA: Diagnosis not present

## 2014-03-10 DIAGNOSIS — R52 Pain, unspecified: Secondary | ICD-10-CM

## 2014-03-10 MED ORDER — HYDROCODONE-ACETAMINOPHEN 5-325 MG PO TABS: 1.0000 | ORAL_TABLET | Freq: Four times a day (QID) | ORAL | Status: DC | PRN

## 2014-03-10 NOTE — Discharge Instructions (Signed)
You have an implanted port a catheter. It has been giving her pain. Everything appears to be in place. He'll be given pain medication. You should follow-up with her oncologist.  Implanted [facility] Guide An implanted port is a type of central line that is placed under the skin. Central lines are used to provide IV access when treatment or nutrition needs to be given through a person's veins. Implanted ports are used for long-term IV access. An implanted port may be placed because:   You need IV medicine that would be irritating to the small veins in your hands or arms.   You need long-term IV medicines, such as antibiotics.   You need IV nutrition for a long period.   You need frequent blood draws for lab tests.   You need dialysis.  Implanted ports are usually placed in the chest area, but they can also be placed in the upper arm, the abdomen, or the leg. An implanted port has two main parts:   Reservoir. The reservoir is round and will appear as a small, raised area under your skin. The reservoir is the part where a needle is inserted to give medicines or draw blood.   Catheter. The catheter is a thin, flexible tube that extends from the reservoir. The catheter is placed into a large vein. Medicine that is inserted into the reservoir goes into the catheter and then into the vein.  HOW WILL I CARE FOR MY INCISION SITE? Do not get the incision site wet. Bathe or shower as directed by your health care provider.  HOW IS MY PORT ACCESSED? Special steps must be taken to access the port:   Before the port is accessed, a numbing cream can be placed on the skin. This helps numb the skin over the port site.   Your health care provider uses a sterile technique to access the port.  Your health care provider must put on a mask and sterile gloves.  The skin over your port is cleaned carefully with an antiseptic and allowed to dry.  The port is gently pinched between sterile gloves, and a  needle is inserted into the port.  Only "non-coring" port needles should be used to access the port. Once the port is accessed, a blood return should be checked. This helps ensure that the port is in the vein and is not clogged.   If your port needs to remain accessed for a constant infusion, a clear (transparent) bandage will be placed over the needle site. The bandage and needle will need to be changed every week, or as directed by your health care provider.   Keep the bandage covering the needle clean and dry. Do not get it wet. Follow your health care provider's instructions on how to take a shower or bath while the port is accessed.   If your port does not need to stay accessed, no bandage is needed over the port.  WHAT IS FLUSHING? Flushing helps keep the port from getting clogged. Follow your health care provider's instructions on how and when to flush the port. Ports are usually flushed with saline solution or a medicine called heparin. The need for flushing will depend on how the port is used.   If the port is used for intermittent medicines or blood draws, the port will need to be flushed:   After medicines have been given.   After blood has been drawn.   As part of routine maintenance.   If a constant  infusion is running, the port may not need to be flushed.  HOW LONG WILL MY PORT STAY IMPLANTED? The port can stay in for as long as your health care provider thinks it is needed. When it is time for the port to come out, surgery will be done to remove it. The procedure is similar to the one performed when the port was put in.  WHEN SHOULD I SEEK IMMEDIATE MEDICAL CARE? When you have an implanted port, you should seek immediate medical care if:   You notice a bad smell coming from the incision site.   You have swelling, redness, or drainage at the incision site.   You have more swelling or pain at the port site or the surrounding area.   You have a fever that is  not controlled with medicine. Document Released: 04/24/2005 Document Revised: 02/12/2013 Document Reviewed: 12/30/2012 [facility] Patient Information 2015 [place], [place]. This information is not intended to replace advice given to you by your health care provider. Make sure you discuss any questions you have with your health care provider.

## 2014-03-10 NOTE — ED Notes (Signed)
Patient was educated not to drive, operate heavy machinery, or drink alcohol while taking narcotic medication.  

## 2014-03-10 NOTE — ED Provider Notes (Signed)
CSN: [id number]     Arrival date & time 03/10/14  1353 History   First MD Initiated Contact with Patient 03/10/14 1411     Chief Complaint  Patient presents with  . Post-op Problem     (Consider location/radiation/quality/duration/timing/severity/associated sxs/prior Treatment) HPI  This is an 80 year old male who presents with pain over the right Port-A-Cath site. Patient had a Port-A-Cath placed yesterday. He said states that since pacemaker he has had pain over the site of the Port-A-Cath. He states that the pain is sharp. He does not take anything at home for pain. Currently he is pain-free. He states that the pain comes and goes. He denies any other symptoms including shortness of breath, headache or fevers. He denies any redness or swelling over the site.  Past Medical History  Diagnosis Date  . Carotid stenosis     u/s 5/11: R 0-39% L 40-59% (stable)  . HTN (hypertension)     refractory. unable to cardura in past due to hypotension  . Edema   . PAD (peripheral artery disease)   . Cerebrovascular disease, unspecified   . HLD (hyperlipidemia)   . Bradycardia   . Diabetes mellitus type II   . Hyperkalemia   . Shortness of breath   . Arthritis     " IN MY HANDS "  . Chronic kidney disease, stage III (moderate)   . Stroke   . Cancer     esophageal.    Past Surgical History  Procedure Laterality Date  . Carotid endarterectomy  1/01    right  . Balloon angioplasty, artery Bilateral 04/09/2013    LEFT ILIAC & RT COMMON ILIAC     DR ARIDA    . Femoral artery stent  ?   2010    DR COOPER  . Esophagogastroduodenoscopy N/A 01/31/2014    Procedure: ESOPHAGOGASTRODUODENOSCOPY (EGD);  Surgeon: Milus Banister, MD;  Location: [facility];  Service: Endoscopy;  Laterality: N/A;   Family History  Problem Relation Age of Onset  . Coronary artery disease Father    History  Substance Use Topics  . Smoking status: Former Smoker    Quit date: 05/08/1994  . Smokeless tobacco:  Never Used  . Alcohol Use: No    Review of Systems  Constitutional: Negative.  Negative for fever.       Pain over Port-A-Cath site  Respiratory: Negative.  Negative for chest tightness and shortness of breath.   Cardiovascular: Negative.  Negative for chest pain.  Gastrointestinal: Negative.   Genitourinary: Negative.  Negative for dysuria.  Skin: Negative for color change.  Neurological: Negative for headaches.  All other systems reviewed and are negative.     Allergies  Contrast media; Metformin hcl; Aliskiren; and Doxazosin mesylate  Home Medications   Prior to Admission medications   Medication Sig Start Date End Date Taking? Authorizing Provider  amLODipine (NORVASC) 10 MG tablet Take 1 tablet (10 mg total) by mouth daily. 10/15/12  Yes Amy D Clegg, NP  aspirin 81 MG tablet Take 1 tablet (81 mg total) by mouth daily. 04/11/13  Yes Rhonda G Barrett, PA-C  cloNIDine (CATAPRES) 0.3 MG tablet Take 0.6 mg by mouth 2 (two) times daily.   Yes Historical Provider, MD  clopidogrel (PLAVIX) 75 MG tablet Take 75 mg by mouth daily.    Yes Historical Provider, MD  furosemide (LASIX) 20 MG tablet Take 1 tablet (20 mg total) by mouth daily as needed (for swelling). 03/03/14  Yes Wellington Hampshire, MD  hydrALAZINE (APRESOLINE) 50 MG tablet Take 1.5 tablets (75 mg total) by mouth 3 (three) times daily. 08/15/13 09/19/15 Yes Larey Dresser, MD  Insulin Lispro Prot & Lispro (HUMALOG MIX 50/50 PEN [place]) Inject 18 Units into the skin 2 (two) times daily.    Yes Historical Provider, MD  pregabalin (LYRICA) 75 MG capsule Take 75 mg by mouth 3 (three) times daily.    Yes Historical Provider, MD  traMADol (ULTRAM) 50 MG tablet Take 1 tablet (50 mg total) by mouth every 12 (twelve) hours as needed for moderate pain. 02/01/14  Yes Sheela Stack, MD  HYDROcodone-acetaminophen (NORCO/VICODIN) 5-325 MG per tablet Take 1-2 tablets by mouth every 6 (six) hours as needed for moderate pain or severe pain.  03/10/14   Merryl Hacker, MD   BP 144/43 mmHg  Pulse 46  Temp(Src) 97.8 F (36.6 C) (Oral)  Resp 14  SpO2 97% Physical Exam  Constitutional: He is oriented to person, place, and time. No distress.  HENT:  Head: Normocephalic and atraumatic.  Cardiovascular: Normal rate, regular rhythm and normal heart sounds.   No murmur heard. Pulmonary/Chest: Effort normal and breath sounds normal. No respiratory distress. He has no wheezes.  Port-A-Cath in the right upper chest, incision, clean dry and intact, Dermabond in place, no active bleeding, mild ecchymosis, otherwise no ecchymosis, no TTP  Musculoskeletal: He exhibits no edema.  Neurological: He is alert and oriented to person, place, and time.  Skin: Skin is warm and dry.  Psychiatric: He has a normal mood and affect.  Nursing note and vitals reviewed.   ED Course  Procedures (including critical care time) Labs Review Labs Reviewed - No data to display  Imaging Review Dg Chest 2 View  03/10/2014   CLINICAL DATA:  Right-sided chest pain after Port-A-Cath placement yesterday.  EXAM: CHEST  2 VIEW  COMPARISON:  Port-A-Cath placement imaging 03/09/2014. Acute abdominal series 01/30/2014.  FINDINGS: A right IJ Port-A-Cath is in place. Tip is at the cavoatrial junction. The heart is mildly enlarged. Left basilar atelectasis is noted. The upper lung fields are clear. There is no pneumothorax. Pleural calcifications are again noted.  IMPRESSION: 1. Interval placement of right IJ Port-A-Cath without radiographic evidence for complication. 2. Progressive left basilar airspace disease, likely reflecting atelectasis. 3. Bilateral pleural calcifications   Electronically Signed   By: Lawrence Santiago M.D.   On: 03/10/2014 15:20   Ir Fluoro Guide Cv Line Right  03/09/2014   CLINICAL DATA:  Adenocarcinoma of the stomach and esophagus  EXAM: TUNNEL POWER PORT PLACEMENT WITH SUBCUTANEOUS POCKET UTILIZING ULTRASOUND & FLOUROSCOPY  FLUOROSCOPY TIME:  One  minutes  MEDICATIONS AND MEDICAL HISTORY: Versed 1 mg, Fentanyl 50 mcg.  As antibiotic prophylaxis, Ancef was ordered pre-procedure and administered intravenously within one hour of incision.  ANESTHESIA/SEDATION: Moderate sedation time: 30 minutes  CONTRAST:  None  PROCEDURE: After written informed consent was obtained, patient was placed in the supine position on angiographic table. The right neck and chest was prepped and draped in a sterile fashion. Lidocaine was utilized for local anesthesia. The right internal jugular vein was noted to be patent initially with ultrasound. Under sonographic guidance, a micropuncture needle was inserted into the right IJ vein (Ultrasound and fluoroscopic image documentation was performed). The needle was removed over an 018 wire which was exchanged for a Amplatz. This was advanced into the IVC. An 8-French dilator was advanced over the Amplatz.  A small incision was made in the right upper chest  over the anterior right second rib. Utilizing blunt dissection, a subcutaneous pocket was created in the caudal direction. The pocket was irrigated with a copious amount of sterile normal saline. The port catheter was tunneled from the chest incision, and out the neck incision. The reservoir was inserted into the subcutaneous pocket and secured with two 3-0 Ethilon stitches. A peel-away sheath was advanced over the Amplatz wire. The port catheter was cut to measure length and inserted through the peel-away sheath. The peel-away sheath was removed. The chest incision was closed with 3-0 Vicryl interrupted stitches for the subcutaneous tissue and a running of 4-0 Vicryl subcuticular stitch for the skin. The neck incision was closed with a 4-0 Vicryl subcuticular stitch. Derma-bond was applied to both surgical incisions. The port reservoir was flushed and instilled with heparinized saline. No complications.  FINDINGS: A right IJ vein Port-A-Cath is in place with its tip at the cavoatrial  junction.  IMPRESSION: Successful 8 French right internal jugular vein power port placement with its tip at the SVC/RA junction.   Electronically Signed   By: Maryclare Bean M.D.   On: 03/09/2014 16:27   Ir US Guide Vasc Access Right  03/09/2014   CLINICAL DATA:  Adenocarcinoma of the stomach and esophagus  EXAM: TUNNEL POWER PORT PLACEMENT WITH SUBCUTANEOUS POCKET UTILIZING ULTRASOUND & FLOUROSCOPY  FLUOROSCOPY TIME:  One minutes  MEDICATIONS AND MEDICAL HISTORY: Versed 1 mg, Fentanyl 50 mcg.  As antibiotic prophylaxis, Ancef was ordered pre-procedure and administered intravenously within one hour of incision.  ANESTHESIA/SEDATION: Moderate sedation time: 30 minutes  CONTRAST:  None  PROCEDURE: After written informed consent was obtained, patient was placed in the supine position on angiographic table. The right neck and chest was prepped and draped in a sterile fashion. Lidocaine was utilized for local anesthesia. The right internal jugular vein was noted to be patent initially with ultrasound. Under sonographic guidance, a micropuncture needle was inserted into the right IJ vein (Ultrasound and fluoroscopic image documentation was performed). The needle was removed over an 018 wire which was exchanged for a Amplatz. This was advanced into the IVC. An 8-French dilator was advanced over the Amplatz.  A small incision was made in the right upper chest over the anterior right second rib. Utilizing blunt dissection, a subcutaneous pocket was created in the caudal direction. The pocket was irrigated with a copious amount of sterile normal saline. The port catheter was tunneled from the chest incision, and out the neck incision. The reservoir was inserted into the subcutaneous pocket and secured with two 3-0 Ethilon stitches. A peel-away sheath was advanced over the Amplatz wire. The port catheter was cut to measure length and inserted through the peel-away sheath. The peel-away sheath was removed. The chest incision was  closed with 3-0 Vicryl interrupted stitches for the subcutaneous tissue and a running of 4-0 Vicryl subcuticular stitch for the skin. The neck incision was closed with a 4-0 Vicryl subcuticular stitch. Derma-bond was applied to both surgical incisions. The port reservoir was flushed and instilled with heparinized saline. No complications.  FINDINGS: A right IJ vein Port-A-Cath is in place with its tip at the cavoatrial junction.  IMPRESSION: Successful 8 French right internal jugular vein power port placement with its tip at the SVC/RA junction.   Electronically Signed   By: Maryclare Bean M.D.   On: 03/09/2014 16:27     EKG Interpretation   Date/Time:  Tuesday March 10 2014 14:12:30 EST Ventricular Rate:  42 PR Interval:  283 QRS Duration: 156 QT Interval:  522 QTC Calculation: 436 R Axis:   -29 Text Interpretation:  Sinus bradycardia Prolonged PR interval Right bundle  branch block No significant change since last tracing Confirmed by Shir Bergman   MD, Loma Sousa ([id number]) on 03/10/2014 2:18:54 PM      MDM   Final diagnoses:  Port-a-cath in place  Pain    Patient presents with pain over the Port-A-Cath site. Currently pain-free. Site is clean, dry, intact. No evidence of overlying infection. Nontender. Chest x-ray shows appropriate placement. Patient does not have pain medication at home. We'll provide patient with pain medication in case pain returns. Was advised to follow-up with his oncologist.  After history, exam, and medical workup I feel the patient has been appropriately medically screened and is safe for discharge home. Pertinent diagnoses were discussed with the patient. Patient was given return precautions.    Merryl Hacker, MD 03/10/14 [phone]

## 2014-03-10 NOTE — ED Notes (Signed)
Pt had power port placed yesterday and c/o pain form port down right side, states it went and came last night but today it is worse. Pt has guaze and dressing on port.

## 2014-03-10 NOTE — Telephone Encounter (Signed)
Message from pt's daughter requesting to move appts to a later time on 11/13. No open slots at this time, she voiced understanding. Her daughter will accompany pt to visit. She is concerned with his increasing swallowing difficulty. Would like to discuss placement of a stent at next visit. Pt reported to daughter he was having a tough time swallowing jello. Will make Dr. Benay Spice aware. Per Dr. Benay Spice: RN to assess during infusion visit 11/4. If pt is unable to tolerate liquids, may hold chemo. Daughter informed that pt will not need lab appt 11/4.

## 2014-03-11 ENCOUNTER — Telehealth: Payer: Self-pay | Admitting: Oncology

## 2014-03-11 ENCOUNTER — Other Ambulatory Visit: Payer: Medicare Other

## 2014-03-11 ENCOUNTER — Ambulatory Visit: Payer: Medicare Other | Admitting: Nutrition

## 2014-03-11 ENCOUNTER — Ambulatory Visit (HOSPITAL_BASED_OUTPATIENT_CLINIC_OR_DEPARTMENT_OTHER): Payer: Medicare Other

## 2014-03-11 ENCOUNTER — Other Ambulatory Visit: Payer: Self-pay | Admitting: *Deleted

## 2014-03-11 ENCOUNTER — Other Ambulatory Visit: Payer: Self-pay | Admitting: Oncology

## 2014-03-11 ENCOUNTER — Other Ambulatory Visit: Payer: Self-pay | Admitting: Nurse Practitioner

## 2014-03-11 DIAGNOSIS — Z5111 Encounter for antineoplastic chemotherapy: Secondary | ICD-10-CM

## 2014-03-11 DIAGNOSIS — C159 Malignant neoplasm of esophagus, unspecified: Secondary | ICD-10-CM

## 2014-03-11 DIAGNOSIS — C160 Malignant neoplasm of cardia: Secondary | ICD-10-CM

## 2014-03-11 DIAGNOSIS — C787 Secondary malignant neoplasm of liver and intrahepatic bile duct: Secondary | ICD-10-CM

## 2014-03-11 MED ORDER — HEPARIN SOD (PORK) LOCK FLUSH 100 UNIT/ML IV SOLN
500.0000 [IU] | Freq: Once | INTRAVENOUS | Status: AC | PRN
  Administered 2014-03-11: 500 [IU]
  Filled 2014-03-11: qty 5

## 2014-03-11 MED ORDER — LIDOCAINE-PRILOCAINE 2.5-2.5 % EX CREA: 1.0000 "application " | TOPICAL_CREAM | CUTANEOUS | Status: DC | PRN

## 2014-03-11 MED ORDER — LEUCOVORIN CALCIUM INJECTION 350 MG
300.0000 mg/m2 | Freq: Once | INTRAMUSCULAR | Status: AC
  Administered 2014-03-11: 588 mg via INTRAVENOUS
  Filled 2014-03-11: qty 29.4

## 2014-03-11 MED ORDER — SODIUM CHLORIDE 0.9 % IJ SOLN
10.0000 mL | INTRAMUSCULAR | Status: DC | PRN
  Administered 2014-03-11: 10 mL
  Filled 2014-03-11: qty 10

## 2014-03-11 MED ORDER — SODIUM CHLORIDE 0.9 % IV SOLN
60.0000 mg/m2 | Freq: Once | INTRAVENOUS | Status: AC
  Administered 2014-03-11: 120 mg via INTRAVENOUS
  Filled 2014-03-11: qty 6

## 2014-03-11 MED ORDER — DEXAMETHASONE SODIUM PHOSPHATE 10 MG/ML IJ SOLN
INTRAMUSCULAR | Status: AC
  Filled 2014-03-11: qty 1

## 2014-03-11 MED ORDER — ONDANSETRON 8 MG/50ML IVPB (CHCC)
8.0000 mg | Freq: Once | INTRAVENOUS | Status: AC
  Administered 2014-03-11: 8 mg via INTRAVENOUS

## 2014-03-11 MED ORDER — SODIUM CHLORIDE 0.9 % IV SOLN
Freq: Once | INTRAVENOUS | Status: AC
  Administered 2014-03-11: 12:00:00 via INTRAVENOUS

## 2014-03-11 MED ORDER — ONDANSETRON 8 MG/NS 50 ML IVPB
INTRAVENOUS | Status: AC
  Filled 2014-03-11: qty 8

## 2014-03-11 MED ORDER — DEXAMETHASONE SODIUM PHOSPHATE 10 MG/ML IJ SOLN
10.0000 mg | Freq: Once | INTRAMUSCULAR | Status: AC
  Administered 2014-03-11: 10 mg via INTRAVENOUS

## 2014-03-11 MED ORDER — PROCHLORPERAZINE MALEATE 5 MG PO TABS
5.0000 mg | ORAL_TABLET | Freq: Four times a day (QID) | ORAL | Status: DC | PRN
Start: 2014-03-11 — End: 2014-05-15

## 2014-03-11 MED ORDER — FLUOROURACIL CHEMO INJECTION 2.5 GM/50ML
400.0000 mg/m2 | Freq: Once | INTRAVENOUS | Status: AC
  Administered 2014-03-11: 800 mg via INTRAVENOUS
  Filled 2014-03-11: qty 16

## 2014-03-11 NOTE — Progress Notes (Signed)
Nutrition followup completed with patient during chemotherapy.  Patient reports poor appetite and decreased oral intake.   Weight has decreased and was documented as 174 pounds November 2.   Usual body weight documented as 188 pounds.  Patient continues to drink one Glucerna every morning with his coffee.  He continues to graze throughout the day.  However, is consuming very few calories.  Patient reports constipation for greater than one week and cannot remember the last time he had a bowel movement.  Nutrition diagnosis: Unintentional weight loss continues.  Intervention: Patient educated to increase Glucerna 3 times a day between meals. Provided oral nutrition supplement, coupons, and samples. Educated patient to continue high-calorie, high-protein foods frequently throughout the day. Encouraged patient try blenderizing foods.   Questions were answered.  Teach back method used.  Monitoring, evaluation, goals:  Patient has been unable to increase oral intake and has had weight loss.  He will work to increase his oral nutrition supplements 3 times a day.  Next visit: Patient has my contact information for questions or concerns.  **Disclaimer: This note was dictated with voice recognition software. Similar sounding words can inadvertently be transcribed and this note may contain transcription errors which may not have been corrected upon publication of note.**

## 2014-03-11 NOTE — Progress Notes (Signed)
Per Dr. Benay Spice : Needs to have Neulasta on 03/14/14. Order sent to scheduling dept.

## 2014-03-11 NOTE — Telephone Encounter (Signed)
Per 11/07 POF injection added 11/07 pt to be advised on next visit per note

## 2014-03-11 NOTE — Patient Instructions (Addendum)
[facility] Discharge Instructions for Patients Receiving Chemotherapy  Today you received the following chemotherapy agents: Etoposide, Fluorouracil and Leucovorin.   To help prevent nausea and vomiting after your treatment, we encourage you to take your nausea medication: Compazine. Take one every six hours as needed.    If you develop nausea and vomiting that is not controlled by your nausea medication, call the clinic.   For your constipation, take Citrate of Magnesia. Drink half the bottle. If no BM in 4 hours, drink the other half. Begin Senokot-S twice daily (Start the day after you take Citrate of Magnesia)   BELOW ARE SYMPTOMS THAT SHOULD BE REPORTED IMMEDIATELY:  *FEVER GREATER THAN 100.5 F  *CHILLS WITH OR WITHOUT FEVER  NAUSEA AND VOMITING THAT IS NOT CONTROLLED WITH YOUR NAUSEA MEDICATION  *UNUSUAL SHORTNESS OF BREATH  *UNUSUAL BRUISING OR BLEEDING  TENDERNESS IN MOUTH AND THROAT WITH OR WITHOUT PRESENCE OF ULCERS  *URINARY PROBLEMS  *BOWEL PROBLEMS  UNUSUAL RASH Items with * indicate a potential emergency and should be followed up as soon as possible.  Feel free to call the clinic you have any questions or concerns. The clinic phone number is [phone].

## 2014-03-12 ENCOUNTER — Ambulatory Visit (HOSPITAL_BASED_OUTPATIENT_CLINIC_OR_DEPARTMENT_OTHER): Payer: Medicare Other

## 2014-03-12 ENCOUNTER — Encounter (INDEPENDENT_AMBULATORY_CARE_PROVIDER_SITE_OTHER): Payer: Medicare Other | Admitting: Ophthalmology

## 2014-03-12 DIAGNOSIS — C159 Malignant neoplasm of esophagus, unspecified: Secondary | ICD-10-CM

## 2014-03-12 DIAGNOSIS — Z5111 Encounter for antineoplastic chemotherapy: Secondary | ICD-10-CM

## 2014-03-12 DIAGNOSIS — C160 Malignant neoplasm of cardia: Secondary | ICD-10-CM

## 2014-03-12 DIAGNOSIS — C787 Secondary malignant neoplasm of liver and intrahepatic bile duct: Secondary | ICD-10-CM

## 2014-03-12 MED ORDER — HEPARIN SOD (PORK) LOCK FLUSH 100 UNIT/ML IV SOLN
500.0000 [IU] | Freq: Once | INTRAVENOUS | Status: AC | PRN
  Administered 2014-03-12: 500 [IU]
  Filled 2014-03-12: qty 5

## 2014-03-12 MED ORDER — DEXAMETHASONE SODIUM PHOSPHATE 10 MG/ML IJ SOLN
10.0000 mg | Freq: Once | INTRAMUSCULAR | Status: AC
  Administered 2014-03-12: 10 mg via INTRAVENOUS

## 2014-03-12 MED ORDER — DEXAMETHASONE SODIUM PHOSPHATE 10 MG/ML IJ SOLN
INTRAMUSCULAR | Status: AC
  Filled 2014-03-12: qty 1

## 2014-03-12 MED ORDER — ONDANSETRON 8 MG/NS 50 ML IVPB
INTRAVENOUS | Status: AC
  Filled 2014-03-12: qty 8

## 2014-03-12 MED ORDER — SODIUM CHLORIDE 0.9 % IJ SOLN
10.0000 mL | INTRAMUSCULAR | Status: DC | PRN
  Administered 2014-03-12: 10 mL
  Filled 2014-03-12: qty 10

## 2014-03-12 MED ORDER — FLUOROURACIL CHEMO INJECTION 2.5 GM/50ML
400.0000 mg/m2 | Freq: Once | INTRAVENOUS | Status: AC
  Administered 2014-03-12: 800 mg via INTRAVENOUS
  Filled 2014-03-12: qty 16

## 2014-03-12 MED ORDER — ONDANSETRON 8 MG/50ML IVPB (CHCC)
8.0000 mg | Freq: Once | INTRAVENOUS | Status: AC
  Administered 2014-03-12: 8 mg via INTRAVENOUS

## 2014-03-12 MED ORDER — SODIUM CHLORIDE 0.9 % IV SOLN
60.0000 mg/m2 | Freq: Once | INTRAVENOUS | Status: AC
  Administered 2014-03-12: 120 mg via INTRAVENOUS
  Filled 2014-03-12: qty 6

## 2014-03-12 MED ORDER — LEUCOVORIN CALCIUM INJECTION 350 MG
300.0000 mg/m2 | Freq: Once | INTRAMUSCULAR | Status: AC
  Administered 2014-03-12: 588 mg via INTRAVENOUS
  Filled 2014-03-12: qty 29.4

## 2014-03-12 MED ORDER — SODIUM CHLORIDE 0.9 % IV SOLN
Freq: Once | INTRAVENOUS | Status: AC
  Administered 2014-03-12: 13:00:00 via INTRAVENOUS

## 2014-03-12 NOTE — Patient Instructions (Signed)
[facility] Discharge Instructions for Patients Receiving Chemotherapy  Today you received the following chemotherapy agents leucovorin, etoposide, 5FU  To help prevent nausea and vomiting after your treatment, we encourage you to take your nausea medication as needed.   If you develop nausea and vomiting that is not controlled by your nausea medication, call the clinic.   BELOW ARE SYMPTOMS THAT SHOULD BE REPORTED IMMEDIATELY:  *FEVER GREATER THAN 100.5 F  *CHILLS WITH OR WITHOUT FEVER  NAUSEA AND VOMITING THAT IS NOT CONTROLLED WITH YOUR NAUSEA MEDICATION  *UNUSUAL SHORTNESS OF BREATH  *UNUSUAL BRUISING OR BLEEDING  TENDERNESS IN MOUTH AND THROAT WITH OR WITHOUT PRESENCE OF ULCERS  *URINARY PROBLEMS  *BOWEL PROBLEMS  UNUSUAL RASH Items with * indicate a potential emergency and should be followed up as soon as possible.  Feel free to call the clinic you have any questions or concerns. The clinic phone number is [phone].

## 2014-03-13 ENCOUNTER — Other Ambulatory Visit (HOSPITAL_BASED_OUTPATIENT_CLINIC_OR_DEPARTMENT_OTHER): Payer: Medicare Other

## 2014-03-13 ENCOUNTER — Ambulatory Visit (HOSPITAL_BASED_OUTPATIENT_CLINIC_OR_DEPARTMENT_OTHER): Payer: Medicare Other

## 2014-03-13 ENCOUNTER — Other Ambulatory Visit: Payer: Self-pay | Admitting: *Deleted

## 2014-03-13 DIAGNOSIS — C787 Secondary malignant neoplasm of liver and intrahepatic bile duct: Secondary | ICD-10-CM

## 2014-03-13 DIAGNOSIS — Z5111 Encounter for antineoplastic chemotherapy: Secondary | ICD-10-CM

## 2014-03-13 DIAGNOSIS — C159 Malignant neoplasm of esophagus, unspecified: Secondary | ICD-10-CM

## 2014-03-13 DIAGNOSIS — D6489 Other specified anemias: Secondary | ICD-10-CM

## 2014-03-13 DIAGNOSIS — C160 Malignant neoplasm of cardia: Secondary | ICD-10-CM

## 2014-03-13 LAB — CBC WITH DIFFERENTIAL/PLATELET
BASO%: 0.2 % (ref 0.0–2.0)
BASOS ABS: 0 10*3/uL (ref 0.0–0.1)
EOS%: 0 % (ref 0.0–7.0)
Eosinophils Absolute: 0 10*3/uL (ref 0.0–0.5)
HCT: 24.6 % — ABNORMAL LOW (ref 38.4–49.9)
HGB: 7.8 g/dL — ABNORMAL LOW (ref 13.0–17.1)
LYMPH#: 0.9 10*3/uL (ref 0.9–3.3)
LYMPH%: 19.7 % (ref 14.0–49.0)
MCH: 28 pg (ref 27.2–33.4)
MCHC: 31.7 g/dL — ABNORMAL LOW (ref 32.0–36.0)
MCV: 88.2 fL (ref 79.3–98.0)
MONO#: 0.2 10*3/uL (ref 0.1–0.9)
MONO%: 5.1 % (ref 0.0–14.0)
NEUT#: 3.4 10*3/uL (ref 1.5–6.5)
NEUT%: 75 % (ref 39.0–75.0)
Platelets: 133 10*3/uL — ABNORMAL LOW (ref 140–400)
RBC: 2.79 10*6/uL — AB (ref 4.20–5.82)
RDW: 15.2 % — AB (ref 11.0–14.6)
WBC: 4.5 10*3/uL (ref 4.0–10.3)

## 2014-03-13 LAB — HOLD TUBE, BLOOD BANK

## 2014-03-13 MED ORDER — DEXAMETHASONE SODIUM PHOSPHATE 10 MG/ML IJ SOLN
10.0000 mg | Freq: Once | INTRAMUSCULAR | Status: AC
  Administered 2014-03-13: 10 mg via INTRAVENOUS

## 2014-03-13 MED ORDER — SODIUM CHLORIDE 0.9 % IV SOLN
60.0000 mg/m2 | Freq: Once | INTRAVENOUS | Status: AC
  Administered 2014-03-13: 120 mg via INTRAVENOUS
  Filled 2014-03-13: qty 6

## 2014-03-13 MED ORDER — HEPARIN SOD (PORK) LOCK FLUSH 100 UNIT/ML IV SOLN
500.0000 [IU] | Freq: Once | INTRAVENOUS | Status: AC | PRN
  Administered 2014-03-13: 500 [IU]
  Filled 2014-03-13: qty 5

## 2014-03-13 MED ORDER — ONDANSETRON 8 MG/NS 50 ML IVPB
INTRAVENOUS | Status: AC
  Filled 2014-03-13: qty 8

## 2014-03-13 MED ORDER — DEXAMETHASONE SODIUM PHOSPHATE 10 MG/ML IJ SOLN
INTRAMUSCULAR | Status: AC
  Filled 2014-03-13: qty 1

## 2014-03-13 MED ORDER — ONDANSETRON 8 MG/50ML IVPB (CHCC)
8.0000 mg | Freq: Once | INTRAVENOUS | Status: AC
  Administered 2014-03-13: 8 mg via INTRAVENOUS

## 2014-03-13 MED ORDER — SODIUM CHLORIDE 0.9 % IJ SOLN
10.0000 mL | INTRAMUSCULAR | Status: DC | PRN
  Administered 2014-03-13: 10 mL
  Filled 2014-03-13: qty 10

## 2014-03-13 MED ORDER — FLUOROURACIL CHEMO INJECTION 2.5 GM/50ML
400.0000 mg/m2 | Freq: Once | INTRAVENOUS | Status: AC
  Administered 2014-03-13: 800 mg via INTRAVENOUS
  Filled 2014-03-13: qty 16

## 2014-03-13 MED ORDER — SODIUM CHLORIDE 0.9 % IV SOLN
Freq: Once | INTRAVENOUS | Status: AC
  Administered 2014-03-13: 12:00:00 via INTRAVENOUS

## 2014-03-13 MED ORDER — LEUCOVORIN CALCIUM INJECTION 350 MG
300.0000 mg/m2 | Freq: Once | INTRAMUSCULAR | Status: AC
  Administered 2014-03-13: 588 mg via INTRAVENOUS
  Filled 2014-03-13: qty 29.4

## 2014-03-13 NOTE — Patient Instructions (Signed)
[facility] Discharge Instructions for Patients Receiving Chemotherapy  Today you received the following chemotherapy agents Etoposide, methotrexate and 5 fu To help prevent nausea and vomiting after your treatment, we encourage you to take your nausea medication as prescribed.  If you develop nausea and vomiting that is not controlled by your nausea medication, call the clinic.   BELOW ARE SYMPTOMS THAT SHOULD BE REPORTED IMMEDIATELY:  *FEVER GREATER THAN 100.5 F  *CHILLS WITH OR WITHOUT FEVER  NAUSEA AND VOMITING THAT IS NOT CONTROLLED WITH YOUR NAUSEA MEDICATION  *UNUSUAL SHORTNESS OF BREATH  *UNUSUAL BRUISING OR BLEEDING  TENDERNESS IN MOUTH AND THROAT WITH OR WITHOUT PRESENCE OF ULCERS  *URINARY PROBLEMS  *BOWEL PROBLEMS  UNUSUAL RASH Items with * indicate a potential emergency and should be followed up as soon as possible.  Feel free to call the clinic you have any questions or concerns. The clinic phone number is [phone].

## 2014-03-13 NOTE — Progress Notes (Signed)
OK to treat with Hgb of 7.8 per Dr. Benay Spice.

## 2014-03-14 ENCOUNTER — Ambulatory Visit (HOSPITAL_BASED_OUTPATIENT_CLINIC_OR_DEPARTMENT_OTHER): Payer: Medicare Other

## 2014-03-14 DIAGNOSIS — C159 Malignant neoplasm of esophagus, unspecified: Secondary | ICD-10-CM

## 2014-03-14 DIAGNOSIS — C786 Secondary malignant neoplasm of retroperitoneum and peritoneum: Secondary | ICD-10-CM

## 2014-03-14 DIAGNOSIS — Z5189 Encounter for other specified aftercare: Secondary | ICD-10-CM

## 2014-03-14 DIAGNOSIS — C160 Malignant neoplasm of cardia: Secondary | ICD-10-CM

## 2014-03-14 DIAGNOSIS — C787 Secondary malignant neoplasm of liver and intrahepatic bile duct: Secondary | ICD-10-CM

## 2014-03-14 MED ORDER — PEGFILGRASTIM INJECTION 6 MG/0.6ML ~~LOC~~
6.0000 mg | PREFILLED_SYRINGE | Freq: Once | SUBCUTANEOUS | Status: AC
  Administered 2014-03-14: 6 mg via SUBCUTANEOUS

## 2014-03-14 NOTE — Patient Instructions (Signed)
Pegfilgrastim injection What is this medicine? PEGFILGRASTIM (peg fil GRA stim) is a long-acting granulocyte colony-stimulating factor that stimulates the growth of neutrophils, a type of white blood cell important in the body's fight against infection. It is used to reduce the incidence of fever and infection in patients with certain types of cancer who are receiving chemotherapy that affects the bone marrow. This medicine may be used for other purposes; ask your health care provider or pharmacist if you have questions. COMMON BRAND NAME(S): Neulasta What should I tell my health care provider before I take this medicine? They need to know if you have any of these conditions: -latex allergy -ongoing radiation therapy -sickle cell disease -skin reactions to acrylic adhesives (On-Body Injector only) -an unusual or allergic reaction to pegfilgrastim, filgrastim, other medicines, foods, dyes, or preservatives -pregnant or trying to get pregnant -breast-feeding How should I use this medicine? This medicine is for injection under the skin. If you get this medicine at home, you will be taught how to prepare and give the pre-filled syringe or how to use the On-body Injector. Refer to the patient Instructions for Use for detailed instructions. Use exactly as directed. Take your medicine at regular intervals. Do not take your medicine more often than directed. It is important that you put your used needles and syringes in a special sharps container. Do not put them in a trash can. If you do not have a sharps container, call your pharmacist or healthcare provider to get one. Talk to your pediatrician regarding the use of this medicine in children. Special care may be needed. Overdosage: If you think you have taken too much of this medicine contact a poison control center or emergency room at once. NOTE: This medicine is only for you. Do not share this medicine with others. What if I miss a dose? It is  important not to miss your dose. Call your doctor or health care professional if you miss your dose. If you miss a dose due to an On-body Injector failure or leakage, a new dose should be administered as soon as possible using a single prefilled syringe for manual use. What may interact with this medicine? Interactions have not been studied. Give your health care provider a list of all the medicines, herbs, non-prescription drugs, or dietary supplements you use. Also tell them if you smoke, drink alcohol, or use illegal drugs. Some items may interact with your medicine. This list may not describe all possible interactions. Give your health care provider a list of all the medicines, herbs, non-prescription drugs, or dietary supplements you use. Also tell them if you smoke, drink alcohol, or use illegal drugs. Some items may interact with your medicine. What should I watch for while using this medicine? You may need blood work done while you are taking this medicine. If you are going to need a MRI, CT scan, or other procedure, tell your doctor that you are using this medicine (On-Body Injector only). What side effects may I notice from receiving this medicine? Side effects that you should report to your doctor or health care professional as soon as possible: -allergic reactions like skin rash, itching or hives, swelling of the face, lips, or tongue -dizziness -fever -pain, redness, or irritation at site where injected -pinpoint red spots on the skin -shortness of breath or breathing problems -stomach or side pain, or pain at the shoulder -swelling -tiredness -trouble passing urine Side effects that usually do not require medical attention (report to your doctor   or health care professional if they continue or are bothersome): -bone pain -muscle pain This list may not describe all possible side effects. Call your doctor for medical advice about side effects. You may report side effects to FDA at  1-800-FDA-1088. Where should I keep my medicine? Keep out of the reach of children. Store pre-filled syringes in a refrigerator between 2 and 8 degrees C (36 and 46 degrees F). Do not freeze. Keep in carton to protect from light. Throw away this medicine if it is left out of the refrigerator for more than 48 hours. Throw away any unused medicine after the expiration date. NOTE: This sheet is a summary. It may not cover all possible information. If you have questions about this medicine, talk to your doctor, pharmacist, or health care provider.  2015, [employer]. (2013-07-24 16:14:05)  

## 2014-03-16 ENCOUNTER — Telehealth: Payer: Self-pay | Admitting: *Deleted

## 2014-03-16 ENCOUNTER — Telehealth: Payer: Self-pay | Admitting: Oncology

## 2014-03-16 ENCOUNTER — Encounter: Payer: Self-pay | Admitting: Oncology

## 2014-03-16 NOTE — Telephone Encounter (Signed)
, °

## 2014-03-16 NOTE — Telephone Encounter (Signed)
Call from pt's wife reporting he has not had a bowel movement after taking Citrate of Magnesium. Is taking Senokot with no relief. Has not had a bowel movement in about 2 weeks. Recommended pt use Fleets Enema. Mrs. Krach became upset, stated she spoke with pt's brother who is taking Amitiza for constipation. She wants him to take this. Attempted to explain rationale for enema. Mrs. Bellanger then hung up the phone. Reviewed with Dr. Benay Spice: Should use enema to get things moving. If pt will not use enema, may try Sorbitol 24mL BID for 2 days. Call if no BM. Attempted to call pt/ wife, no answer. Left message requesting return call.

## 2014-03-17 ENCOUNTER — Emergency Department (HOSPITAL_COMMUNITY): Payer: Medicare Other

## 2014-03-17 ENCOUNTER — Encounter (HOSPITAL_COMMUNITY): Payer: Self-pay | Admitting: Emergency Medicine

## 2014-03-17 ENCOUNTER — Telehealth: Payer: Self-pay | Admitting: *Deleted

## 2014-03-17 ENCOUNTER — Inpatient Hospital Stay: Admission: AD | Admit: 2014-03-17 | Payer: Self-pay | Source: Ambulatory Visit | Admitting: Internal Medicine

## 2014-03-17 ENCOUNTER — Ambulatory Visit (HOSPITAL_BASED_OUTPATIENT_CLINIC_OR_DEPARTMENT_OTHER): Payer: Medicare Other | Admitting: Nurse Practitioner

## 2014-03-17 ENCOUNTER — Other Ambulatory Visit: Payer: Self-pay

## 2014-03-17 ENCOUNTER — Inpatient Hospital Stay (HOSPITAL_COMMUNITY)
Admission: EM | Admit: 2014-03-17 | Discharge: 2014-03-25 | DRG: 070 | Disposition: A | Discharge status: Home / Self Care | Payer: Medicare Other | Attending: Internal Medicine | Admitting: Internal Medicine

## 2014-03-17 ENCOUNTER — Encounter: Payer: Self-pay | Admitting: Nurse Practitioner

## 2014-03-17 ENCOUNTER — Ambulatory Visit: Payer: Medicare Other

## 2014-03-17 VITALS — BP 214/60 | HR 97 | Temp 97.5°F | Resp 18 | Ht 69.0 in

## 2014-03-17 DIAGNOSIS — C778 Secondary and unspecified malignant neoplasm of lymph nodes of multiple regions: Secondary | ICD-10-CM | POA: Diagnosis present

## 2014-03-17 DIAGNOSIS — S20211A Contusion of right front wall of thorax, initial encounter: Secondary | ICD-10-CM | POA: Insufficient documentation

## 2014-03-17 DIAGNOSIS — Z9889 Other specified postprocedural states: Secondary | ICD-10-CM | POA: Diagnosis not present

## 2014-03-17 DIAGNOSIS — M19041 Primary osteoarthritis, right hand: Secondary | ICD-10-CM | POA: Diagnosis present

## 2014-03-17 DIAGNOSIS — R6510 Systemic inflammatory response syndrome (SIRS) of non-infectious origin without acute organ dysfunction: Secondary | ICD-10-CM

## 2014-03-17 DIAGNOSIS — Z91041 Radiographic dye allergy status: Secondary | ICD-10-CM | POA: Diagnosis not present

## 2014-03-17 DIAGNOSIS — E43 Unspecified severe protein-calorie malnutrition: Secondary | ICD-10-CM | POA: Insufficient documentation

## 2014-03-17 DIAGNOSIS — E870 Hyperosmolality and hypernatremia: Secondary | ICD-10-CM | POA: Diagnosis present

## 2014-03-17 DIAGNOSIS — N183 Chronic kidney disease, stage 3 unspecified: Secondary | ICD-10-CM | POA: Diagnosis present

## 2014-03-17 DIAGNOSIS — Z8673 Personal history of transient ischemic attack (TIA), and cerebral infarction without residual deficits: Secondary | ICD-10-CM | POA: Diagnosis not present

## 2014-03-17 DIAGNOSIS — E785 Hyperlipidemia, unspecified: Secondary | ICD-10-CM | POA: Diagnosis present

## 2014-03-17 DIAGNOSIS — I739 Peripheral vascular disease, unspecified: Secondary | ICD-10-CM | POA: Diagnosis present

## 2014-03-17 DIAGNOSIS — C787 Secondary malignant neoplasm of liver and intrahepatic bile duct: Secondary | ICD-10-CM | POA: Diagnosis present

## 2014-03-17 DIAGNOSIS — C159 Malignant neoplasm of esophagus, unspecified: Secondary | ICD-10-CM

## 2014-03-17 DIAGNOSIS — K5900 Constipation, unspecified: Secondary | ICD-10-CM | POA: Diagnosis present

## 2014-03-17 DIAGNOSIS — I679 Cerebrovascular disease, unspecified: Secondary | ICD-10-CM | POA: Diagnosis present

## 2014-03-17 DIAGNOSIS — R4182 Altered mental status, unspecified: Secondary | ICD-10-CM

## 2014-03-17 DIAGNOSIS — I6523 Occlusion and stenosis of bilateral carotid arteries: Secondary | ICD-10-CM | POA: Diagnosis present

## 2014-03-17 DIAGNOSIS — R1310 Dysphagia, unspecified: Secondary | ICD-10-CM | POA: Diagnosis present

## 2014-03-17 DIAGNOSIS — R112 Nausea with vomiting, unspecified: Secondary | ICD-10-CM

## 2014-03-17 DIAGNOSIS — I10 Essential (primary) hypertension: Secondary | ICD-10-CM

## 2014-03-17 DIAGNOSIS — Z794 Long term (current) use of insulin: Secondary | ICD-10-CM

## 2014-03-17 DIAGNOSIS — E860 Dehydration: Secondary | ICD-10-CM | POA: Diagnosis present

## 2014-03-17 DIAGNOSIS — G9340 Encephalopathy, unspecified: Principal | ICD-10-CM

## 2014-03-17 DIAGNOSIS — D61810 Antineoplastic chemotherapy induced pancytopenia: Secondary | ICD-10-CM | POA: Diagnosis present

## 2014-03-17 DIAGNOSIS — E1122 Type 2 diabetes mellitus with diabetic chronic kidney disease: Secondary | ICD-10-CM | POA: Diagnosis present

## 2014-03-17 DIAGNOSIS — R059 Cough, unspecified: Secondary | ICD-10-CM

## 2014-03-17 DIAGNOSIS — M19042 Primary osteoarthritis, left hand: Secondary | ICD-10-CM | POA: Diagnosis present

## 2014-03-17 DIAGNOSIS — C160 Malignant neoplasm of cardia: Secondary | ICD-10-CM | POA: Diagnosis present

## 2014-03-17 DIAGNOSIS — E1142 Type 2 diabetes mellitus with diabetic polyneuropathy: Secondary | ICD-10-CM | POA: Diagnosis present

## 2014-03-17 DIAGNOSIS — T451X5A Adverse effect of antineoplastic and immunosuppressive drugs, initial encounter: Secondary | ICD-10-CM | POA: Diagnosis present

## 2014-03-17 DIAGNOSIS — Z7902 Long term (current) use of antithrombotics/antiplatelets: Secondary | ICD-10-CM | POA: Diagnosis not present

## 2014-03-17 DIAGNOSIS — Z888 Allergy status to other drugs, medicaments and biological substances status: Secondary | ICD-10-CM | POA: Diagnosis not present

## 2014-03-17 DIAGNOSIS — R6511 Systemic inflammatory response syndrome (SIRS) of non-infectious origin with acute organ dysfunction: Secondary | ICD-10-CM | POA: Diagnosis present

## 2014-03-17 DIAGNOSIS — Z87891 Personal history of nicotine dependence: Secondary | ICD-10-CM

## 2014-03-17 DIAGNOSIS — I160 Hypertensive urgency: Secondary | ICD-10-CM | POA: Diagnosis present

## 2014-03-17 DIAGNOSIS — N179 Acute kidney failure, unspecified: Secondary | ICD-10-CM | POA: Diagnosis present

## 2014-03-17 DIAGNOSIS — Z7982 Long term (current) use of aspirin: Secondary | ICD-10-CM

## 2014-03-17 DIAGNOSIS — Z79899 Other long term (current) drug therapy: Secondary | ICD-10-CM | POA: Diagnosis not present

## 2014-03-17 DIAGNOSIS — I129 Hypertensive chronic kidney disease with stage 1 through stage 4 chronic kidney disease, or unspecified chronic kidney disease: Secondary | ICD-10-CM | POA: Diagnosis present

## 2014-03-17 DIAGNOSIS — R410 Disorientation, unspecified: Secondary | ICD-10-CM

## 2014-03-17 DIAGNOSIS — D6489 Other specified anemias: Secondary | ICD-10-CM

## 2014-03-17 DIAGNOSIS — Z95820 Peripheral vascular angioplasty status with implants and grafts: Secondary | ICD-10-CM

## 2014-03-17 DIAGNOSIS — R05 Cough: Secondary | ICD-10-CM

## 2014-03-17 LAB — CBC WITH DIFFERENTIAL/PLATELET
BASO%: 0.2 % (ref 0.0–2.0)
Basophils Absolute: 0 10*3/uL (ref 0.0–0.1)
EOS%: 0.1 % (ref 0.0–7.0)
Eosinophils Absolute: 0 10*3/uL (ref 0.0–0.5)
HCT: 25.6 % — ABNORMAL LOW (ref 38.4–49.9)
HGB: 7.9 g/dL — ABNORMAL LOW (ref 13.0–17.1)
LYMPH%: 10.3 % — ABNORMAL LOW (ref 14.0–49.0)
MCH: 27.1 pg — ABNORMAL LOW (ref 27.2–33.4)
MCHC: 31.1 g/dL — AB (ref 32.0–36.0)
MCV: 87.1 fL (ref 79.3–98.0)
MONO#: 0 10*3/uL — AB (ref 0.1–0.9)
MONO%: 0.9 % (ref 0.0–14.0)
NEUT#: 2.9 10*3/uL (ref 1.5–6.5)
NEUT%: 88.5 % — ABNORMAL HIGH (ref 39.0–75.0)
Platelets: 111 10*3/uL — ABNORMAL LOW (ref 140–400)
RBC: 2.93 10*6/uL — AB (ref 4.20–5.82)
RDW: 15 % — AB (ref 11.0–14.6)
WBC: 3.3 10*3/uL — ABNORMAL LOW (ref 4.0–10.3)
lymph#: 0.3 10*3/uL — ABNORMAL LOW (ref 0.9–3.3)

## 2014-03-17 LAB — COMPREHENSIVE METABOLIC PANEL (CC13)
ALT: 12 U/L (ref 0–55)
ANION GAP: 13 meq/L — AB (ref 3–11)
AST: 16 U/L (ref 5–34)
Albumin: 3.4 g/dL — ABNORMAL LOW (ref 3.5–5.0)
Alkaline Phosphatase: 136 U/L (ref 40–150)
BUN: 41.1 mg/dL — ABNORMAL HIGH (ref 7.0–26.0)
CHLORIDE: 113 meq/L — AB (ref 98–109)
CO2: 23 meq/L (ref 22–29)
Calcium: 9 mg/dL (ref 8.4–10.4)
Creatinine: 1.5 mg/dL — ABNORMAL HIGH (ref 0.7–1.3)
Glucose: 224 mg/dl — ABNORMAL HIGH (ref 70–140)
Potassium: 3.7 mEq/L (ref 3.5–5.1)
SODIUM: 149 meq/L — AB (ref 136–145)
TOTAL PROTEIN: 6 g/dL — AB (ref 6.4–8.3)
Total Bilirubin: 1.12 mg/dL (ref 0.20–1.20)

## 2014-03-17 LAB — COMPREHENSIVE METABOLIC PANEL
ALT: 11 U/L (ref 0–53)
AST: 26 U/L (ref 0–37)
Albumin: 3.5 g/dL (ref 3.5–5.2)
Alkaline Phosphatase: 134 U/L — ABNORMAL HIGH (ref 39–117)
Anion gap: 18 — ABNORMAL HIGH (ref 5–15)
BUN: 42 mg/dL — ABNORMAL HIGH (ref 6–23)
CALCIUM: 9.1 mg/dL (ref 8.4–10.5)
CO2: 21 meq/L (ref 19–32)
Chloride: 109 mEq/L (ref 96–112)
Creatinine, Ser: 1.45 mg/dL — ABNORMAL HIGH (ref 0.50–1.35)
GFR calc Af Amer: 50 mL/min — ABNORMAL LOW (ref >90)
GFR, EST NON AFRICAN AMERICAN: 43 mL/min — AB (ref >90)
Glucose, Bld: 227 mg/dL — ABNORMAL HIGH (ref 70–99)
Potassium: 3.8 mEq/L (ref 3.7–5.3)
Sodium: 148 mEq/L — ABNORMAL HIGH (ref 137–147)
TOTAL PROTEIN: 6.3 g/dL (ref 6.0–8.3)
Total Bilirubin: 1.1 mg/dL (ref 0.3–1.2)

## 2014-03-17 LAB — DIFFERENTIAL
Basophils Absolute: 0 10*3/uL (ref 0.0–0.1)
Basophils Relative: 0 % (ref 0–1)
Eosinophils Absolute: 0 10*3/uL (ref 0.0–0.7)
Eosinophils Relative: 0 % (ref 0–5)
LYMPHS PCT: 9 % — AB (ref 12–46)
Lymphs Abs: 0.2 10*3/uL — ABNORMAL LOW (ref 0.7–4.0)
MONO ABS: 0 10*3/uL — AB (ref 0.1–1.0)
Monocytes Relative: 2 % — ABNORMAL LOW (ref 3–12)
NEUTROS ABS: 2.4 10*3/uL (ref 1.7–7.7)
Neutrophils Relative %: 90 % — ABNORMAL HIGH (ref 43–77)

## 2014-03-17 LAB — URINE MICROSCOPIC-ADD ON

## 2014-03-17 LAB — URINALYSIS, ROUTINE W REFLEX MICROSCOPIC
Bilirubin Urine: NEGATIVE
Glucose, UA: 100 mg/dL — AB
Ketones, ur: NEGATIVE mg/dL
Leukocytes, UA: NEGATIVE
Nitrite: NEGATIVE
Specific Gravity, Urine: 1.017 (ref 1.005–1.030)
UROBILINOGEN UA: 0.2 mg/dL (ref 0.0–1.0)
pH: 5 (ref 5.0–8.0)

## 2014-03-17 LAB — PROTIME-INR
INR: 1.36 (ref 0.00–1.49)
Prothrombin Time: 16.9 seconds — ABNORMAL HIGH (ref 11.6–15.2)

## 2014-03-17 LAB — CBC
HCT: 24.1 % — ABNORMAL LOW (ref 39.0–52.0)
Hemoglobin: 7.9 g/dL — ABNORMAL LOW (ref 13.0–17.0)
MCH: 28.4 pg (ref 26.0–34.0)
MCHC: 32.8 g/dL (ref 30.0–36.0)
MCV: 86.7 fL (ref 78.0–100.0)
PLATELETS: 111 10*3/uL — AB (ref 150–400)
RBC: 2.78 MIL/uL — AB (ref 4.22–5.81)
RDW: 14.6 % (ref 11.5–15.5)
WBC: 2.6 10*3/uL — ABNORMAL LOW (ref 4.0–10.5)

## 2014-03-17 LAB — HOLD TUBE, BLOOD BANK

## 2014-03-17 LAB — CBG MONITORING, ED: Glucose-Capillary: 218 mg/dL — ABNORMAL HIGH (ref 70–99)

## 2014-03-17 LAB — TROPONIN I: Troponin I: <0.3 ng/mL (ref <0.30)

## 2014-03-17 MED ORDER — FUROSEMIDE 20 MG PO TABS
20.0000 mg | ORAL_TABLET | Freq: Every day | ORAL | Status: DC | PRN
  Filled 2014-03-17: qty 1

## 2014-03-17 MED ORDER — LABETALOL HCL 5 MG/ML IV SOLN
10.0000 mg | Freq: Once | INTRAVENOUS | Status: AC
  Administered 2014-03-17: 10 mg via INTRAVENOUS
  Filled 2014-03-17: qty 4

## 2014-03-17 MED ORDER — LABETALOL HCL 5 MG/ML IV SOLN
20.0000 mg | Freq: Once | INTRAVENOUS | Status: AC
  Administered 2014-03-17: 20 mg via INTRAVENOUS
  Filled 2014-03-17: qty 4

## 2014-03-17 MED ORDER — AMLODIPINE BESYLATE 10 MG PO TABS
10.0000 mg | ORAL_TABLET | Freq: Every day | ORAL | Status: DC
  Administered 2014-03-18 – 2014-03-25 (×8): 10 mg via ORAL
  Filled 2014-03-17 (×8): qty 1

## 2014-03-17 MED ORDER — NICARDIPINE HCL IN NACL 20-0.86 MG/200ML-% IV SOLN
3.0000 mg/h | Freq: Once | INTRAVENOUS | Status: DC
Start: 2014-03-17 — End: 2014-03-17

## 2014-03-17 MED ORDER — ASPIRIN EC 81 MG PO TBEC
81.0000 mg | DELAYED_RELEASE_TABLET | Freq: Every day | ORAL | Status: DC
  Administered 2014-03-18 – 2014-03-19 (×2): 81 mg via ORAL
  Filled 2014-03-17 (×2): qty 1

## 2014-03-17 MED ORDER — SENNOSIDES-DOCUSATE SODIUM 8.6-50 MG PO TABS
1.0000 | ORAL_TABLET | Freq: Two times a day (BID) | ORAL | Status: DC
  Administered 2014-03-17 – 2014-03-25 (×11): 1 via ORAL
  Filled 2014-03-17 (×17): qty 1

## 2014-03-17 MED ORDER — CLONIDINE HCL 0.3 MG PO TABS
0.3000 mg | ORAL_TABLET | Freq: Two times a day (BID) | ORAL | Status: DC
  Administered 2014-03-18 – 2014-03-25 (×14): 0.3 mg via ORAL
  Filled 2014-03-17 (×16): qty 1

## 2014-03-17 MED ORDER — DEXTROSE 5 % IV SOLN
1.0000 g | Freq: Once | INTRAVENOUS | Status: AC
  Administered 2014-03-17: 1 g via INTRAVENOUS
  Filled 2014-03-17: qty 1

## 2014-03-17 MED ORDER — VANCOMYCIN HCL IN DEXTROSE 1-5 GM/200ML-% IV SOLN: 1000.0000 mg | INTRAVENOUS | Status: DC

## 2014-03-17 MED ORDER — CLONIDINE HCL 0.1 MG PO TABS
0.3000 mg | ORAL_TABLET | Freq: Once | ORAL | Status: AC
  Administered 2014-03-17: 0.3 mg via ORAL
  Filled 2014-03-17: qty 3

## 2014-03-17 MED ORDER — NICARDIPINE HCL IN NACL 20-0.86 MG/200ML-% IV SOLN
3.0000 mg/h | Freq: Once | INTRAVENOUS | Status: AC
Start: 2014-03-17 — End: 2014-03-17
  Administered 2014-03-17: 5 mg/h via INTRAVENOUS
  Filled 2014-03-17: qty 200

## 2014-03-17 MED ORDER — SODIUM CHLORIDE 0.9 % IV SOLN: INTRAVENOUS | Status: DC

## 2014-03-17 MED ORDER — NICARDIPINE HCL IN NACL 20-0.86 MG/200ML-% IV SOLN
3.0000 mg/h | INTRAVENOUS | Status: DC
  Administered 2014-03-17: 5 mg/h via INTRAVENOUS
  Administered 2014-03-18: 7.5 mg/h via INTRAVENOUS
  Administered 2014-03-18: 12.5 mg/h via INTRAVENOUS
  Administered 2014-03-18: 10 mg/h via INTRAVENOUS
  Filled 2014-03-17 (×4): qty 200

## 2014-03-17 MED ORDER — PROCHLORPERAZINE MALEATE 10 MG PO TABS
5.0000 mg | ORAL_TABLET | Freq: Four times a day (QID) | ORAL | Status: DC | PRN
  Filled 2014-03-17: qty 1

## 2014-03-17 MED ORDER — HYDRALAZINE HCL 50 MG PO TABS
75.0000 mg | ORAL_TABLET | Freq: Three times a day (TID) | ORAL | Status: DC
  Administered 2014-03-18 – 2014-03-19 (×5): 75 mg via ORAL
  Filled 2014-03-17 (×10): qty 1

## 2014-03-17 MED ORDER — TRAMADOL HCL 50 MG PO TABS
50.0000 mg | ORAL_TABLET | Freq: Two times a day (BID) | ORAL | Status: DC | PRN
  Administered 2014-03-18: 50 mg via ORAL
  Filled 2014-03-17: qty 1

## 2014-03-17 MED ORDER — SODIUM CHLORIDE 0.9 % IV SOLN: INTRAVENOUS | Status: AC

## 2014-03-17 MED ORDER — CLOPIDOGREL BISULFATE 75 MG PO TABS
75.0000 mg | ORAL_TABLET | Freq: Every day | ORAL | Status: DC
  Administered 2014-03-18 – 2014-03-19 (×2): 75 mg via ORAL
  Filled 2014-03-17 (×3): qty 1

## 2014-03-17 MED ORDER — NICARDIPINE HCL IN NACL 20-0.86 MG/200ML-% IV SOLN
3.0000 mg/h | Freq: Once | INTRAVENOUS | Status: DC
  Filled 2014-03-17: qty 200

## 2014-03-17 MED ORDER — PREGABALIN 75 MG PO CAPS
75.0000 mg | ORAL_CAPSULE | Freq: Three times a day (TID) | ORAL | Status: DC
  Administered 2014-03-18 – 2014-03-25 (×23): 75 mg via ORAL
  Filled 2014-03-17 (×23): qty 1

## 2014-03-17 MED ORDER — VANCOMYCIN HCL IN DEXTROSE 1-5 GM/200ML-% IV SOLN
1000.0000 mg | INTRAVENOUS | Status: AC
  Administered 2014-03-17: 1000 mg via INTRAVENOUS
  Filled 2014-03-17: qty 200

## 2014-03-17 MED ORDER — HEPARIN SODIUM (PORCINE) 5000 UNIT/ML IJ SOLN
5000.0000 [IU] | Freq: Three times a day (TID) | INTRAMUSCULAR | Status: DC
  Filled 2014-03-17 (×2): qty 1

## 2014-03-17 MED ORDER — HYDRALAZINE HCL 20 MG/ML IJ SOLN
10.0000 mg | Freq: Once | INTRAMUSCULAR | Status: AC
  Administered 2014-03-17: 10 mg via INTRAVENOUS
  Filled 2014-03-17: qty 1

## 2014-03-17 MED ORDER — HYDROCODONE-ACETAMINOPHEN 5-325 MG PO TABS: 1.0000 | ORAL_TABLET | Freq: Four times a day (QID) | ORAL | Status: DC | PRN

## 2014-03-17 NOTE — Telephone Encounter (Signed)
   Provider input needed: Pain, Constipation & dehydration   Reason for call: Abdominal Pain, Constipation, Minimal po intake  Constitutional: positive for malaise and weakness-in bed since Sunday Gastrointestinal: positive for constipation and and not eating solid food and only takes in about 16 ounces po /day Having LLQ abdominal pain   ALLERGIES:  is allergic to contrast media; metformin hcl; aliskiren; and doxazosin mesylate.  Patient last received chemotherapy/ treatment on 11/4-11/6--Etoposide, 5FU/LV  Patient was last seen in the office on 03/04/14   Next appt is 03/20/14   Is patient having fevers greater than 100.5?  no   Is patient having uncontrolled pain, or new pain? yes, LLQ despite laxatives and enema   Is patient having new back pain that changes with position (worsens or eases when laying down?)  no   Is patient able to eat and drink? yes, but he won't eat anything he is supposed to eat, trys to eat things he can't swallow. Only takes in 16 ounces fluid/day at most    Is patient able to pass stool without difficulty?   no, very constipated      Is patient having uncontrolled nausea?  no    family calls 03/17/2014 with complaint of  see above   Summary Based on the above information advised family and caregiver to  Allow RN to discuss situation with wife and provider.   Tania Ade  03/17/2014, 8:56 AM   Background Info  [name]   DOB: Mar 19, 1930   MR#: [id number]   CSN#   [id number] 03/17/2014

## 2014-03-17 NOTE — Progress Notes (Signed)
SYMPTOM MANAGEMENT CLINIC   HPI: [name] 80 y.o. male diagnosed with GE junction adenocarcinoma/esophageal cancer.  Currently undergoing etoposide/leucovorin/5-FU chemotherapy regimen.  Patient's wife called the [facility] today requesting urgent care visit.  She states the patient received his initial cycle of chemotherapy therapy this past Wednesday, 03/11/2014.  Since that time-patient has been experiencing severe nausea/vomiting/dehydration.  He has been eating/drinking very little.  He is progressively weaker.  He has been unable to take his blood pressure medications.  Patient has also been increasingly confused/disoriented for the past 24 hours.  Patient lives with his wife and adult daughter.  Patient's wife is concerned since their daughter is out of the house as to the day; and the wife is the primary care provider.  Patient's family called EMS last night for transport to the emergency department for further evaluation; the patient adamantly refused transport to the emergency department.   HPI  CURRENT THERAPY: Upcoming Treatment Dates - GASTRIC ELF q28d Days with orders from any treatment category:  04/08/2014      SCHEDULING COMMUNICATION      ondansetron (ZOFRAN) IVPB 8 mg      dexamethasone (DECADRON) injection 10 mg      leucovorin injection 588 mg      fluorouracil (ADRUCIL) chemo injection 800 mg      etoposide (VEPESID) 120 mg in sodium chloride 0.9 % 500 mL chemo infusion      sodium chloride 0.9 % injection 10 mL      heparin lock flush 100 unit/mL      heparin lock flush 100 unit/mL      alteplase (CATHFLO ACTIVASE) injection 2 mg      sodium chloride 0.9 % injection 3 mL      Cold Pack 1 packet      Hot Pack 1 packet      0.9 %  sodium chloride infusion      TREATMENT CONDITIONS 04/09/2014      SCHEDULING COMMUNICATION      ondansetron (ZOFRAN) IVPB 8 mg      dexamethasone (DECADRON) injection 10 mg      leucovorin injection 588 mg  fluorouracil (ADRUCIL) chemo injection 800 mg      etoposide (VEPESID) 120 mg in sodium chloride 0.9 % 500 mL chemo infusion      sodium chloride 0.9 % injection 10 mL      heparin lock flush 100 unit/mL      heparin lock flush 100 unit/mL      alteplase (CATHFLO ACTIVASE) injection 2 mg      sodium chloride 0.9 % injection 3 mL      Cold Pack 1 packet      Hot Pack 1 packet      0.9 %  sodium chloride infusion 04/10/2014      SCHEDULING COMMUNICATION      ondansetron (ZOFRAN) IVPB 8 mg      dexamethasone (DECADRON) injection 10 mg      leucovorin injection 588 mg      fluorouracil (ADRUCIL) chemo injection 800 mg      etoposide (VEPESID) 120 mg in sodium chloride 0.9 % 500 mL chemo infusion      sodium chloride 0.9 % injection 10 mL      heparin lock flush 100 unit/mL      heparin lock flush 100 unit/mL      alteplase (CATHFLO ACTIVASE) injection 2 mg      sodium chloride 0.9 % injection  3 mL      Cold Pack 1 packet      Hot Pack 1 packet      0.9 %  sodium chloride infusion    ROS  Past Medical History  Diagnosis Date  . Carotid stenosis     u/s 5/11: R 0-39% L 40-59% (stable)  . HTN (hypertension)     refractory. unable to cardura in past due to hypotension  . Edema   . PAD (peripheral artery disease)   . Cerebrovascular disease, unspecified   . HLD (hyperlipidemia)   . Bradycardia   . Diabetes mellitus type II   . Hyperkalemia   . Shortness of breath   . Arthritis     " IN MY HANDS "  . Chronic kidney disease, stage III (moderate)   . Stroke   . Cancer     esophageal.     Past Surgical History  Procedure Laterality Date  . Carotid endarterectomy  1/01    right  . Balloon angioplasty, artery Bilateral 04/09/2013    LEFT ILIAC & RT COMMON ILIAC     DR ARIDA    . Femoral artery stent  ?   2010    DR COOPER  . Esophagogastroduodenoscopy N/A 01/31/2014    Procedure: ESOPHAGOGASTRODUODENOSCOPY (EGD);  Surgeon: Milus Banister, MD;  Location: [facility];   Service: Endoscopy;  Laterality: N/A;    has DIABETES MELLITUS, TYPE II; HYPERLIPIDEMIA; HYPERTENSION; BRADYCARDIA; CAROTID STENOSIS; CEREBROVASCULAR DISEASE; ATHEROSCLEROSIS W /INT CLAUDICATION; ATHEROSCLEROSIS W/ REST PAIN; ABDOMINAL AORTIC ANEURYSM; EDEMA; PAD (peripheral artery disease); Atherosclerosis of native arteries of the extremities with ulceration(440.23); Chronic kidney disease, stage III (moderate); Claudication in peripheral vascular disease; Esophageal cancer; Dysphagia, unspecified(787.20); Dehydration; Nausea with vomiting; Altered mental state; Hematoma of right chest wall; and Hypertension on his problem list.     is allergic to contrast media; metformin hcl; aliskiren; and doxazosin mesylate.    Medication List       This list is accurate as of: 03/17/14  2:40 PM.  Always use your most recent med list.               amLODipine 10 MG tablet  Commonly known as:  NORVASC  Take 1 tablet (10 mg total) by mouth daily.     aspirin 81 MG tablet  Take 1 tablet (81 mg total) by mouth daily.     cloNIDine 0.3 MG tablet  Commonly known as:  CATAPRES  Take 0.6 mg by mouth 2 (two) times daily.     clopidogrel 75 MG tablet  Commonly known as:  PLAVIX  Take 75 mg by mouth daily.     furosemide 20 MG tablet  Commonly known as:  LASIX  Take 1 tablet (20 mg total) by mouth daily as needed (for swelling).     HUMALOG MIX 50/50 PEN [place]  Inject 18 Units into the skin 2 (two) times daily.     hydrALAZINE 50 MG tablet  Commonly known as:  APRESOLINE  Take 1.5 tablets (75 mg total) by mouth 3 (three) times daily.     HYDROcodone-acetaminophen 5-325 MG per tablet  Commonly known as:  NORCO/VICODIN  Take 1-2 tablets by mouth every 6 (six) hours as needed for moderate pain or severe pain.     lidocaine-prilocaine cream  Commonly known as:  EMLA  Apply 1 application topically as needed. Do not rub in. Cover with plastic.     pregabalin 75 MG capsule  Commonly known as:  LYRICA  Take 75 mg by mouth 3 (three) times daily.     prochlorperazine 5 MG tablet  Commonly known as:  COMPAZINE  Take 1 tablet (5 mg total) by mouth every 6 (six) hours as needed for nausea or vomiting.     senna-docusate 8.6-50 MG per tablet  Commonly known as:  Senokot-S  Take 1 tablet by mouth 2 (two) times daily.     traMADol 50 MG tablet  Commonly known as:  ULTRAM  Take 1 tablet (50 mg total) by mouth every 12 (twelve) hours as needed for moderate pain.         PHYSICAL EXAMINATION  Blood pressure 214/60, pulse 97, temperature 97.5 F (36.4 C), temperature source Oral, resp. rate 18, height $RemoveBe'5\' 9"'kxRegZYnM$  (1.753 m), SpO2 100 %.  Physical Exam  Constitutional: He appears dehydrated. He appears unhealthy. He appears cachectic. He has a sickly appearance.  HENT:  Head: Normocephalic and atraumatic.  Mouth/Throat: Oropharynx is clear and moist.  Eyes: Conjunctivae and EOM are normal. Pupils are equal, round, and reactive to light. Right eye exhibits no discharge. Left eye exhibits no discharge. Scleral icterus is present.  Neck: Normal range of motion. Neck supple. No JVD present. No tracheal deviation present. No thyromegaly present.  Cardiovascular: Normal rate, regular rhythm, normal heart sounds and intact distal pulses.   Pulmonary/Chest: Breath sounds normal. No respiratory distress. He has no wheezes. He has no rales. He exhibits no tenderness.  Patient did appear slightly dyspneic on exam; but in no acute respiratory distress.  Abdominal: Soft. Bowel sounds are normal. He exhibits no distension and no mass. There is no tenderness. There is no rebound and no guarding.  Musculoskeletal: Normal range of motion. He exhibits no edema or tenderness.  Lymphadenopathy:    He has no cervical adenopathy.  Neurological:  Patient appeared very fatigued/weak.  He was occasionally drooling.  He was able to answer simple questions and follow simple commands only.  He was disoriented to  time and place on occasion.  He was continually trying to get up out of the wheelchair and move about the room in his weakened state.  He was also removing some of his clothes as well.  There was no facial drooping or one-sided weakness noted on exam.  Skin: Skin is warm and dry. No rash noted. No erythema.  Psychiatric:  See above neurological exam notes.  Nursing note and vitals reviewed.   LABORATORY DATA:. Admission on 03/17/2014  Component Date Value Ref Range Status  . Glucose-Capillary 03/17/2014 218* 70 - 99 mg/dL Final  . Comment 1 03/17/2014 Notify RN   Final  . WBC 03/17/2014 2.6* 4.0 - 10.5 K/uL Final  . RBC 03/17/2014 2.78* 4.22 - 5.81 MIL/uL Final  . Hemoglobin 03/17/2014 7.9* 13.0 - 17.0 g/dL Final  . HCT 03/17/2014 24.1* 39.0 - 52.0 % Final  . MCV 03/17/2014 86.7  78.0 - 100.0 fL Final  . MCH 03/17/2014 28.4  26.0 - 34.0 pg Final  . MCHC 03/17/2014 32.8  30.0 - 36.0 g/dL Final  . RDW 03/17/2014 14.6  11.5 - 15.5 % Final  . Platelets 03/17/2014 111* 150 - 400 K/uL Final   Comment: REPEATED TO VERIFY SPECIMEN CHECKED FOR CLOTS PLATELET COUNT CONFIRMED BY SMEAR   . Sodium 03/17/2014 148* 137 - 147 mEq/L Final  . Potassium 03/17/2014 3.8  3.7 - 5.3 mEq/L Final  . Chloride 03/17/2014 109  96 - 112 mEq/L Final  . CO2 03/17/2014 21  19 -  32 mEq/L Final  . Glucose, Bld 03/17/2014 227* 70 - 99 mg/dL Final  . BUN 29/24/8140 42* 6 - 23 mg/dL Final  . Creatinine, Ser 03/17/2014 1.45* 0.50 - 1.35 mg/dL Final  . Calcium 45/39/6834 9.1  8.4 - 10.5 mg/dL Final  . Total Protein 03/17/2014 6.3  6.0 - 8.3 g/dL Final  . Albumin 99/88/3163 3.5  3.5 - 5.2 g/dL Final  . AST 61/95/4735 26  0 - 37 U/L Final   Comment: SLIGHT HEMOLYSIS HEMOLYSIS AT THIS LEVEL MAY AFFECT RESULT   . ALT 03/17/2014 11  0 - 53 U/L Final  . Alkaline Phosphatase 03/17/2014 134* 39 - 117 U/L Final  . Total Bilirubin 03/17/2014 1.1  0.3 - 1.2 mg/dL Final  . GFR calc non Af Amer 03/17/2014 43* >90 mL/min Final   . GFR calc Af Amer 03/17/2014 50* >90 mL/min Final   Comment: (NOTE) The eGFR has been calculated using the CKD EPI equation. This calculation has not been validated in all clinical situations. eGFR's persistently <90 mL/min signify possible Chronic Kidney Disease.   . Anion gap 03/17/2014 18* 5 - 15 Final  . Prothrombin Time 03/17/2014 16.9* 11.6 - 15.2 seconds Final  . INR 03/17/2014 1.36  0.00 - 1.49 Final  . Troponin I 03/17/2014 <0.30  <0.30 ng/mL Final   Comment:        Due to the release kinetics of cTnI, a negative result within the first hours of the onset of symptoms does not rule out myocardial infarction with certainty. If myocardial infarction is still suspected, repeat the test at appropriate intervals.   . Neutrophils Relative % 03/17/2014 90* 43 - 77 % Final  . Neutro Abs 03/17/2014 2.4  1.7 - 7.7 K/uL Final  . Lymphocytes Relative 03/17/2014 9* 12 - 46 % Final  . Lymphs Abs 03/17/2014 0.2* 0.7 - 4.0 K/uL Final  . Monocytes Relative 03/17/2014 2* 3 - 12 % Final  . Monocytes Absolute 03/17/2014 0.0* 0.1 - 1.0 K/uL Final  . Eosinophils Relative 03/17/2014 0  0 - 5 % Final  . Eosinophils Absolute 03/17/2014 0.0  0.0 - 0.7 K/uL Final  . Basophils Relative 03/17/2014 0  0 - 1 % Final  . Basophils Absolute 03/17/2014 0.0  0.0 - 0.1 K/uL Final  . Smear Review 03/17/2014 DOHLE BODIES   Final   MILD LEFT SHIFT (1-5% METAS, OCC MYELO, OCC BANDS)  Office Visit on 03/17/2014  Component Date Value Ref Range Status  . Hold Tube, Blood Bank 03/17/2014 Sample held in Blood Bank 72 hrs.   Final     RADIOGRAPHIC STUDIES: Ct Head Wo Contrast  03/17/2014   CLINICAL DATA:  Confusion, dehydration, altered mental status  EXAM: CT HEAD WITHOUT CONTRAST  TECHNIQUE: Contiguous axial images were obtained from the base of the skull through the vertex without intravenous contrast.  COMPARISON:  05/05/2012 and 8/ 17/12  FINDINGS: No skull fracture is noted. There is mild posterior mucosal  thickening left maxillary sinus. Left nasal septum deviation. The mastoid air cells are unremarkable.  No intracranial hemorrhage, mass effect or midline shift. Mild cerebral atrophy. Mild periventricular white matter decreased attenuation probable due to chronic small vessel ischemic changes. There is moderate cerebellar atrophy. No acute cortical infarction. No mass lesion is noted on this unenhanced scan. Atherosclerotic calcifications of carotid siphon and vertebral arteries.  IMPRESSION: No acute intracranial abnormality. Mild mucosal thickening posterior aspect of the left maxillary sinus. Mild cerebral atrophy. Moderate cerebral atrophy. Mild periventricular white  matter decreased attenuation probable due to chronic small vessel ischemic changes.   Electronically Signed   By: Lahoma Crocker M.D.   On: 03/17/2014 15:14   Dg Chest Port 1 View  03/17/2014   CLINICAL DATA:  GE junction cancer. Nausea, vomiting and dehydration. Chemotherapy last week. Altered mental status, confusion.  EXAM: PORTABLE CHEST - 1 VIEW  COMPARISON:  03/10/2014  FINDINGS: Right Port-A-Cath remains in place, unchanged. Right Port-A-Cath remains in place, unchanged. Left basilar airspace opacity again noted, stable. No confluent opacity on the right. Mild interstitial prominence throughout the lungs. Cannot completely exclude interstitial edema. Calcified pleural plaques bilaterally.  IMPRESSION: Continued left basilar atelectasis or infiltrate.  Increasing interstitial prominence. Cannot exclude interstitial edema.   Electronically Signed   By: Rolm Baptise M.D.   On: 03/17/2014 16:11    ASSESSMENT/PLAN:    GE Junction Adenocarcinoma: GE junction adenocarcinoma/esophageal cancer.  Patient completed cycle one of his etoposide/leucovorin/5-FU chemotherapy regimen on 03/11/2014.  This regimen is cycled every 28 days.  Nausea/vomiting: Patient has been experiencing fairly severe nausea/vomiting since his initial cycle of chemotherapy  on 03/11/2014.  He has been unable to keep his anti-emetic medication down due to his nausea/vomiting. Patient does appear fairly dehydrated and very weak today on exam  Dehydration: Patient does appear fairly dehydrated today; most likely secondary to chemotherapy.  Patient has been experiencing frequent nausea/vomiting since his chemotherapy.  Patient does have anti-emetics at home; but has been unable to keep them down to his nausea/vomiting.  Right upper chest Port-A-Cath was accessed prior to transport to the emergency department for further evaluation.  All lab results were pending at the time of ED transport.   Altered Mental Status: Patient is a fairly independent individual who plays golf approximately twice per week as his baseline.patient presented today with some disorientation/confusion.  He was disoriented to time and place.  He continually attempted to get out of his wheelchair and walk about; although he does appear fairly weak.  He was also removed and is closed at times.  He was able to follow simple commands and carry on brief conversations; although he did stutter his words at times.  Confusion could be secondary to severe dehydration, acute renal failure, or anemia.    There was no specific facial drooping or one-sided weakness noted on exam.  Patient was noted to be drooling at times; but was managing his oral secretions fairly well.  Patient to be transported to the emergency department for further evaluation.  Portacath associated hematoma; Patient had right upper chest Port-A-Cath placed per Dr. Barbie Banner on 03/09/2014.  It does appear that patient has developed a mild hematoma at the Port-A-Cath site.  Also appears to have mild hematoma to central upper chest in some radiating hematoma to right chest wall down his ribs. Patient's Port-A-Cath was accessed with no difficulty today.  All labs were drawn the her Port-A-Cath today; and all lab results remain pending.   Hypertension:  Patient states that he was unable to take any of his blood pressure medications this morning to to his nausea/vomiting.  Initial blood pressure check revealed a blood pressure reading of 205/177.  Blood pressure recheck revealed a blood pressure of 214/60.  Due to patient's multiple issues-patient was initially to be admitted directly to the hospital per the hospitalist; but was ultimately sent to the emergency department for further evaluation.  A brief history and up-to-date report was given to the emergency department charge nurse.  Charge nurse was advised  that all labs have been drawn; but all results were still pending.  Patient was transported to the emergency department after Port-A-Cath accessed, all labs drawn, and O2 placed per nasal cannula. Patient's wife aware and will follow patient to the emergency department.  Patient stated understanding of all instructions; and was in agreement with this plan of care. The patient knows to call the clinic with any problems, questions or concerns.   This was a shared visit with Dr. Benay Spice today..   Total time spent with patient was  minutes;  with greater than 40 percent of that time spent in face to face counseling regarding his symptoms, and coordination of care and follow up.  Disclaimer: This note was dictated with voice recognition software. Similar sounding words can inadvertently be transcribed and may not be corrected upon review.   Drue Second, NP 03/17/2014   This was a shared visit with Drue Second. Mr. Barg was interviewed and examined.  He presents today with an altered mental status, increased dysphagia, and nausea/vomiting. The etiology of the altered mental status is unclear. He also has severe hypertension. We will arrange for hospital admission today.  I will follow him in the hospital.  Julieanne Manson, M.D.

## 2014-03-17 NOTE — ED Notes (Signed)
Family at bedside. 

## 2014-03-17 NOTE — Progress Notes (Signed)
eLink Physician-Brief Progress Note Patient Name: BASIR NIVEN DOB: 1929/07/20 MRN: [id number]   Date of Service  03/17/2014  HPI/Events of Note  80 yo man, esophageal CA on chemo. Admitted with encephalopathy, poor PO intake over the last several days. He has been undergoing chemo, 3 doses in the last week. Noted to have hypertension, currently 192/48. Nicardipene gtt has been ordered but not yet hanging. Labs stable compared with 03/06/14.   eICU Interventions  No intervention at this time. Will follow     Intervention Category Evaluation Type: New Patient Evaluation  Geoge Lawrance S. 03/17/2014, 11:58 PM

## 2014-03-17 NOTE — Telephone Encounter (Signed)
Attempted to call daughter to inform her of hospital admission. No answer. Did not leave voicemail.

## 2014-03-17 NOTE — ED Notes (Signed)
Pt coming from cancer center. Pt has GE junction cancer, received chemo last week. Now has n/v and dehydration. Altered mental status and confused, port associated hematoma. Pt was to be a direct admit, but with change in mental status Dr Edwin Cap wants me to get head CT, rule out brain bleed. Pt is hypertensive.

## 2014-03-17 NOTE — ED Notes (Signed)
Patient removed from bed pan, patient with smear to bottom. Patient skin cleansed with warm water.

## 2014-03-17 NOTE — ED Notes (Signed)
Entered patient room to titrate Cardene. Dr Hurley Cisco at bedside, states to hold off on titration at this time due to most recent BP being elevated.

## 2014-03-17 NOTE — ED Notes (Signed)
Pt's wife can be reached at cell phone number: [phone]

## 2014-03-17 NOTE — Assessment & Plan Note (Addendum)
Patient does appear fairly dehydrated today; most likely secondary to chemotherapy.  Patient has been experiencing frequent nausea/vomiting since his chemotherapy.  Patient does have anti-emetics at home; but has been unable to keep them down to his nausea/vomiting.  Right upper chest Port-A-Cath was accessed prior to transport to the emergency department for further evaluation.  All lab results were pending at the time of ED transport.

## 2014-03-17 NOTE — ED Notes (Signed)
Dr. Gardener at bedside. 

## 2014-03-17 NOTE — Assessment & Plan Note (Signed)
Patient has been experiencing fairly severe nausea/vomiting since his initial cycle of chemotherapy on 03/11/2014.  He has been unable to keep his anti-emetic medication down due to his nausea/vomiting. Patient does appear fairly dehydrated and very weak today on exam.

## 2014-03-17 NOTE — ED Notes (Signed)
Bed: RESA Expected date:  Expected time:  Means of arrival:  Comments: Cancer pt

## 2014-03-17 NOTE — Progress Notes (Signed)
Foley put in by RN, already has > 850 cc of urine out and his abdominal pain due to feeling "constipated" has resolved and he feels much better.  With pain improvement, suspect he will be able to be titrated off of cardene gtt shortly.

## 2014-03-17 NOTE — Progress Notes (Signed)
  CARE MANAGEMENT ED NOTE 03/17/2014  Patient:  [name], [name]   Account Number:  [id number]  Date Initiated:  03/17/2014  Documentation initiated by:  Livia Snellen  Subjective/Objective Assessment:   Patient presents to Ed with n/v dehydration, increased confusion.     Subjective/Objective Assessment Detail:   Patient currently being treated for espohageal cancer. PMHX of DM, hyperkalemia, stroke, PAD, HTN.  Patient hypertensive, Cardene drip initiated, IV ABX for HCAP     Action/Plan:   Action/Plan Detail:   Anticipated DC Date:       Status Recommendation to Physician:   Result of Recommendation:    Other ED Services  Consult Working [place]  CM consult  Other    Choice offered to / List presented to:            Status of service:  Completed, signed off  ED Comments:   ED Comments Detail:  EDCM spoke to patient's wife at bedside.  Patient lives at home with his wife and his grand daughter.  Patient's wife reports patient's pcp is Dr. Reynold Bowen and patient's oncologist is  Dr.Sherill.  Patient has a walker, cane, and shower bench at home.  Patient is usually able to perform his own ADL's, however since patient has received his first round of chemo last weel Mon, Thurs, and Friday (3 hours each day) patient has not been able to do anything per patient's wife.  Patient's wife reports the only food patient can swallow is pureed.   Patient does not have any home health services currently.  Patient has never had home health services.  EDCM provided patient's family with list of home health agencies in [facility]. EDCM explained with home health the patient may receive a visiting RN, PT, OT aide and social worker if needed.  EDCM also provided a list of private duty nursing agencies and explained it would be an out of pocket expense for the patient. Patient's family thankful for resources.  No further EDCM needs at this time.

## 2014-03-17 NOTE — ED Provider Notes (Signed)
CSN: [id number]     Arrival date & time 03/17/14  1440 History   First MD Initiated Contact with Patient 03/17/14 1456     Chief Complaint  Patient presents with  . Altered Mental Status      HPI Comments: Patient presents for evaluation of confusion. 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 His wife states that since the treatment he has had decreased by mouth intake. He has been really using all meals and he has had very little fluids. She states that this morning when he awoke he was more confused than usual. Confusion is described as difficulty with directions and comprehension. Denies any fevers, vomiting, or recent head injuries.  Patient denies any complaints.  Patient is a 80 y.o. male presenting with altered mental status. The history is provided by the patient and the spouse. History limited by: confusion.  Altered Mental Status   Past Medical History  Diagnosis Date  . Carotid stenosis     u/s 5/11: R 0-39% L 40-59% (stable)  . HTN (hypertension)     refractory. unable to cardura in past due to hypotension  . Edema   . PAD (peripheral artery disease)   . Cerebrovascular disease, unspecified   . HLD (hyperlipidemia)   . Bradycardia   . Diabetes mellitus type II   . Hyperkalemia   . Shortness of breath   . Arthritis     " IN MY HANDS "  . Chronic kidney disease, stage III (moderate)   . Stroke   . Cancer     esophageal.    Past Surgical History  Procedure Laterality Date  . Carotid endarterectomy  1/01    right  . Balloon angioplasty, artery Bilateral 04/09/2013    LEFT ILIAC & RT COMMON ILIAC     DR ARIDA    . Femoral artery stent  ?   2010    DR COOPER  . Esophagogastroduodenoscopy N/A 01/31/2014    Procedure: ESOPHAGOGASTRODUODENOSCOPY (EGD);  Surgeon: Milus Banister, MD;  Location: [facility];  Service: Endoscopy;  Laterality: N/A;   Family History  Problem Relation Age of  Onset  . Coronary artery disease Father    History  Substance Use Topics  . Smoking status: Former Smoker    Quit date: 05/08/1994  . Smokeless tobacco: Never Used  . Alcohol Use: No    Review of Systems  All other systems reviewed and are negative.     Allergies  Contrast media; Metformin hcl; Aliskiren; and Doxazosin mesylate  Home Medications   Prior to Admission medications   Medication Sig Start Date End Date Taking? Authorizing Provider  amLODipine (NORVASC) 10 MG tablet Take 1 tablet (10 mg total) by mouth daily. 10/15/12   Amy D Ninfa Meeker, NP  aspirin 81 MG tablet Take 1 tablet (81 mg total) by mouth daily. 04/11/13   Rhonda G Barrett, PA-C  cloNIDine (CATAPRES) 0.3 MG tablet Take 0.6 mg by mouth 2 (two) times daily.    Historical Provider, MD  clopidogrel (PLAVIX) 75 MG tablet Take 75 mg by mouth daily.     Historical Provider, MD  furosemide (LASIX) 20 MG tablet Take 1 tablet (20 mg total) by mouth daily as needed (for swelling). 03/03/14   Wellington Hampshire, MD  hydrALAZINE (APRESOLINE) 50 MG tablet Take 1.5 tablets (75 mg total) by mouth 3 (three) times daily. 08/15/13 09/19/15  Larey Dresser, MD  HYDROcodone-acetaminophen (NORCO/VICODIN) 5-325 MG per tablet Take 1-2 tablets by mouth every 6 (six) hours as needed for moderate pain or severe pain. 03/10/14   Merryl Hacker, MD  Insulin Lispro Prot & Lispro (HUMALOG MIX 50/50 PEN [place]) Inject 18 Units into the skin 2 (two) times daily.     Historical Provider, MD  lidocaine-prilocaine (EMLA) cream Apply 1 application topically as needed. Do not rub in. Cover with plastic. 03/11/14   Ladell Pier, MD  pregabalin (LYRICA) 75 MG capsule Take 75 mg by mouth 3 (three) times daily.     Historical Provider, MD  prochlorperazine (COMPAZINE) 5 MG tablet Take 1 tablet (5 mg total) by mouth every 6 (six) hours as needed for nausea or vomiting. 03/11/14   Owens Shark, NP  senna-docusate (SENOKOT-S) 8.6-50 MG per tablet Take 1 tablet by  mouth 2 (two) times daily.    Historical Provider, MD  traMADol (ULTRAM) 50 MG tablet Take 1 tablet (50 mg total) by mouth every 12 (twelve) hours as needed for moderate pain. 02/01/14   Sheela Stack, MD   BP 216/74 mmHg  Pulse 95  Temp(Src) 98.3 F (36.8 C) (Oral)  Resp 26  SpO2 96% Physical Exam  Constitutional:  Chronically ill-appearing, mild distress  HENT:  Head: Atraumatic.  Eyes: Pupils are equal, round, and reactive to light.  Cardiovascular: Normal rate.   Faint SEM  Pulmonary/Chest: Effort normal.  Decreased air movement in bilateral bases  Abdominal: Soft.  Mild diffuse abdominal tenderness greatest over the lower abdomen.  No guarding or rebound tenderness.  Musculoskeletal: He exhibits no edema or tenderness.  Neurological: He is alert.  Disoriented to place and time.  5 out of 5 strength in all 4 extremities. No facial asymmetry.  Skin: Skin is warm and dry.  Psychiatric: He has a normal mood and affect.  Nursing note and vitals reviewed.   ED Course  Procedures (including critical care time) CRITICAL CARE Performed by: Quintella Reichert   Total critical care time: 60 minutes  Critical care time was exclusive of separately billable procedures and treating other patients.  Critical care was necessary to treat or prevent imminent or life-threatening deterioration.  Critical care was time spent personally by me on the following activities: development of treatment plan with patient and/or surrogate as well as nursing, discussions with consultants, evaluation of patient's response to treatment, examination of patient, obtaining history from patient or surrogate, ordering and performing treatments and interventions, ordering and review of laboratory studies, ordering and review of radiographic studies, pulse oximetry and re-evaluation of patient's condition. Labs Review Labs Reviewed  CBC - Abnormal; Notable for the following:    WBC 2.6 (*)    RBC 2.78 (*)     Hemoglobin 7.9 (*)    HCT 24.1 (*)    Platelets 111 (*)    All other components within normal limits  COMPREHENSIVE METABOLIC PANEL - Abnormal; Notable for the following:    Sodium 148 (*)    Glucose, Bld 227 (*)    BUN 42 (*)    Creatinine, Ser 1.45 (*)    Alkaline Phosphatase 134 (*)    GFR calc non Af Amer 43 (*)    GFR calc Af Amer 50 (*)    Anion gap 18 (*)    All other components within normal limits  URINALYSIS, ROUTINE W REFLEX MICROSCOPIC - Abnormal; Notable for the following:    Glucose, UA 100 (*)    Hgb urine dipstick TRACE (*)  Protein, ur >300 (*)    All other components within normal limits  PROTIME-INR - Abnormal; Notable for the following:    Prothrombin Time 16.9 (*)    All other components within normal limits  DIFFERENTIAL - Abnormal; Notable for the following:    Neutrophils Relative % 90 (*)    Lymphocytes Relative 9 (*)    Lymphs Abs 0.2 (*)    Monocytes Relative 2 (*)    Monocytes Absolute 0.0 (*)    All other components within normal limits  URINE MICROSCOPIC-ADD ON - Abnormal; Notable for the following:    Casts GRANULAR CAST (*)    All other components within normal limits  CBG MONITORING, ED - Abnormal; Notable for the following:    Glucose-Capillary 218 (*)    All other components within normal limits  MRSA PCR SCREENING  TROPONIN I  CBC  BASIC METABOLIC PANEL    Imaging Review Ct Head Wo Contrast  03/17/2014   CLINICAL DATA:  Confusion, dehydration, altered mental status  EXAM: CT HEAD WITHOUT CONTRAST  TECHNIQUE: Contiguous axial images were obtained from the base of the skull through the vertex without intravenous contrast.  COMPARISON:  05/05/2012 and 8/ 17/12  FINDINGS: No skull fracture is noted. There is mild posterior mucosal thickening left maxillary sinus. Left nasal septum deviation. The mastoid air cells are unremarkable.  No intracranial hemorrhage, mass effect or midline shift. Mild cerebral atrophy. Mild periventricular  white matter decreased attenuation probable due to chronic small vessel ischemic changes. There is moderate cerebellar atrophy. No acute cortical infarction. No mass lesion is noted on this unenhanced scan. Atherosclerotic calcifications of carotid siphon and vertebral arteries.  IMPRESSION: No acute intracranial abnormality. Mild mucosal thickening posterior aspect of the left maxillary sinus. Mild cerebral atrophy. Moderate cerebral atrophy. Mild periventricular white matter decreased attenuation probable due to chronic small vessel ischemic changes.   Electronically Signed   By: Lahoma Crocker M.D.   On: 03/17/2014 15:14   Dg Chest Port 1 View  03/17/2014   CLINICAL DATA:  GE junction cancer. Nausea, vomiting and dehydration. Chemotherapy last week. Altered mental status, confusion.  EXAM: PORTABLE CHEST - 1 VIEW  COMPARISON:  03/10/2014  FINDINGS: Right Port-A-Cath remains in place, unchanged. Right Port-A-Cath remains in place, unchanged. Left basilar airspace opacity again noted, stable. No confluent opacity on the right. Mild interstitial prominence throughout the lungs. Cannot completely exclude interstitial edema. Calcified pleural plaques bilaterally.  IMPRESSION: Continued left basilar atelectasis or infiltrate.  Increasing interstitial prominence. Cannot exclude interstitial edema.   Electronically Signed   By: Rolm Baptise M.D.   On: 03/17/2014 16:11     EKG Interpretation None       MDM   Final diagnoses:  Acute encephalopathy  Hypertensive urgency    Patient presented with hypertensive urgency and altered mental status, currently undergoing chemotherapy for cancer. History of prior multiple agents to control blood pressure. On repeat evaluation the emergency department he had continued confusion but did have a slight improvement when his blood pressure came down to the 160s to 170s. Discussed case with medicine regarding admission. Wife is unsure of his home medications but the chart  lists that he is on 0.6mg  of clonidine twice daily.  Wife states that he has not taken his medications today. Patient was only given 0.3 mg clonidine. given unable to confirm the patient is actually taking the prescribed dosage.  Patient eventually required Cardene drip for blood pressure control.  Patient was initially given  doses of antibiotics for potential pneumonia on chest x-ray, but upon further review it is considered much less likely that patient has pneumonia.    Quintella Reichert, MD 03/18/14 [phone]

## 2014-03-17 NOTE — Assessment & Plan Note (Signed)
Patient states that he was unable to take any of his blood pressure medications this morning to to his nausea/vomiting.  Initial blood pressure check revealed a blood pressure reading of 205/177.  Blood pressure recheck revealed a blood pressure of 214/60.  Due to patient's multiple issues-patient was initially to be admitted directly to the hospital per the hospitalist; but was ultimately sent to the emergency department for further evaluation.  A brief history and up-to-date report was given to the emergency department charge nurse.  Charge nurse was advised that all labs have been drawn; but all results were still pending.  Patient was transported to the emergency department after Port-A-Cath accessed, all labs drawn, and O2 placed per nasal cannula. Patient's wife aware and will follow patient to the emergency department.

## 2014-03-17 NOTE — Assessment & Plan Note (Signed)
Patient had right upper chest Port-A-Cath placed per Dr. Barbie Banner on 03/09/2014.  It does appear that patient has developed a mild hematoma at the Port-A-Cath site.  Also appears to have mild hematoma to central upper chest in some radiating hematoma to right chest wall down his ribs. Patient's Port-A-Cath was accessed with no difficulty today.  All labs were drawn the her Port-A-Cath today; and all lab results remain pending.

## 2014-03-17 NOTE — ED Notes (Signed)
Pt's penis placed in urinal, pt attempted to urinate without success. Pt left with urinal, will continue to attempt. Pt's wife at bedside.

## 2014-03-17 NOTE — Telephone Encounter (Signed)
Wife was able to confirm all information daughter presented and adds that he is nauseated and trying to vomit. Has "spit up" at least 8 times today. Made her aware that Dr. Benay Spice feels he needs to be seen today for evaluation and supportive care. She needs to find someone to drive them. Daughter is in school. She will try a couple friends and requests RN call her in 30 minutes. She wanted to wait till 4:15, but told her this is too late to be able to do anything for him.

## 2014-03-17 NOTE — Assessment & Plan Note (Signed)
Patient is a fairly independent individual who plays golf approximately twice per week as his baseline.patient presented today with some disorientation/confusion.  He was disoriented to time and place.  He continually attempted to get out of his wheelchair and walk about; although he does appear fairly weak.  He was also removed and is closed at times.  He was able to follow simple commands and carry on brief conversations; although he did stutter his words at times.  Confusion could be secondary to severe dehydration, acute renal failure, or anemia.    There was no specific facial drooping or one-sided weakness noted on exam.  Patient was noted to be drooling at times; but was managing his oral secretions fairly well.  Patient to be transported to the emergency department for further evaluation.

## 2014-03-17 NOTE — Progress Notes (Signed)
ANTIBIOTIC CONSULT NOTE - INITIAL  Pharmacy Consult for Vancomycin Indication: HCAP  Allergies  Allergen Reactions  . Contrast Media [Iodinated Diagnostic Agents] Other (See Comments)    Reaction:  History of renal failure following contrast requiring temporary dialysis.    . Metformin Hcl Other (See Comments)    Reaction:  Renal failure----Pt states that "metformin will kill me".  . Aliskiren Rash  . Doxazosin Mesylate Rash    Patient Measurements:     Vital Signs: Temp: 98.3 F (36.8 C) (11/10 1450) Temp Source: Oral (11/10 1450) BP: 237/68 mmHg (11/10 1611) Pulse Rate: 92 (11/10 1611) Intake/Output from previous day:   Intake/Output from this shift:    Labs:  Recent Labs  03/17/14 1457 03/17/14 1500 03/17/14 1500  WBC 2.6* 3.3*  --   HGB 7.9* 7.9*  --   PLT 111* 111*  --   CREATININE 1.45*  --  1.5*   Estimated Creatinine Clearance: 38 mL/min (by C-G formula based on Cr of 1.5). No results for input(s): VANCOTROUGH, VANCOPEAK, VANCORANDOM, GENTTROUGH, GENTPEAK, GENTRANDOM, TOBRATROUGH, TOBRAPEAK, TOBRARND, AMIKACINPEAK, AMIKACINTROU, AMIKACIN in the last 72 hours.   Microbiology: No results found for this or any previous visit (from the past 720 hour(s)).  Medical History: Past Medical History  Diagnosis Date  . Carotid stenosis     u/s 5/11: R 0-39% L 40-59% (stable)  . HTN (hypertension)     refractory. unable to cardura in past due to hypotension  . Edema   . PAD (peripheral artery disease)   . Cerebrovascular disease, unspecified   . HLD (hyperlipidemia)   . Bradycardia   . Diabetes mellitus type II   . Hyperkalemia   . Shortness of breath   . Arthritis     " IN MY HANDS "  . Chronic kidney disease, stage III (moderate)   . Stroke   . Cancer     esophageal.     Medications:  Anti-infectives    Start     Dose/Rate Route Frequency Ordered Stop   03/17/14 1630  ceFEPIme (MAXIPIME) 1 g in dextrose 5 % 50 mL IVPB     1 g100 mL/hr over 30  Minutes Intravenous  Once 03/17/14 1618       Assessment: 80yo M w/ AMS and poor intake. Currently undergoing chemotherapy for GE junction cancer, last given on 11/6. Cefepime is ordered x 1 in the ED and pharmacy is asked to dose Vancomycin for possible HCAP(aspiration?)    11/10 >> Cefepime x 1 11/10 >> Vanc >>    Tmax: AF WBCs: Neulasta on 11/7. Renal: CKD. SCr 1.5(baseline appears ~1.5), 38CG/N  Goal of Therapy:  Vancomycin trough level 15-20 mcg/ml  Appropriate antibiotic dosing for renal function; eradication of infection  Plan:   Vancomycin 1g IV q24h.  Measure Vanc trough at steady state.  Follow up renal fxn and culture results.  Romeo Rabon, PharmD, pager [phone]. 03/17/2014,4:33 PM.

## 2014-03-17 NOTE — H&P (Addendum)
[facility] History and Physical  [name]:[id number] DOB: 1929-07-28 DOA: 03/17/2014  Referring physician: EDP PCP: Sheela Stack, MD   Chief Complaint: Encephalopathy   HPI: [name] is a 80 y.o. male who is sent in from his oncologists office today for AMS.  He has been confused progressively for the past few days since having 3 days of chemotherapy last week.  He has had essentially no PO intake and virtually no water intake over the past 3 or 4 days or so.  He has become confused, keeps trying to eat foods that "he isnt supposed to" with his esophageal cancer.  He has "spit up" some 8 times though wife dosent think this is "true vomiting".  As a result of this PO intake failure, he has also been unable to take any PO meds including his BP meds.  Patient has an extensive history of very refractory HTN according to his chart and indeed his BP is measured as high as 237/68 in the ED this evening.  Review of Systems: Patient has no complaints other than constipation, denies urinary retention (despite bladder scan findings) and states he is "ready to go home" however the ROS cannot be considered very reliable due to his AMS.  Past Medical History  Diagnosis Date  . Carotid stenosis     u/s 5/11: R 0-39% L 40-59% (stable)  . HTN (hypertension)     refractory. unable to cardura in past due to hypotension  . Edema   . PAD (peripheral artery disease)   . Cerebrovascular disease, unspecified   . HLD (hyperlipidemia)   . Bradycardia   . Diabetes mellitus type II   . Hyperkalemia   . Shortness of breath   . Arthritis     " IN MY HANDS "  . Chronic kidney disease, stage III (moderate)   . Stroke   . Cancer     esophageal.    Past Surgical History  Procedure Laterality Date  . Carotid endarterectomy  1/01    right  . Balloon angioplasty, artery Bilateral 04/09/2013    LEFT ILIAC & RT COMMON ILIAC     DR ARIDA    . Femoral artery stent  ?   2010    DR  COOPER  . Esophagogastroduodenoscopy N/A 01/31/2014    Procedure: ESOPHAGOGASTRODUODENOSCOPY (EGD);  Surgeon: Milus Banister, MD;  Location: [facility];  Service: Endoscopy;  Laterality: N/A;   Social History:  reports that he quit smoking about 19 years ago. He has never used smokeless tobacco. He reports that he does not drink alcohol or use illicit drugs.  Allergies  Allergen Reactions  . Contrast Media [Iodinated Diagnostic Agents] Other (See Comments)    Reaction:  History of renal failure following contrast requiring temporary dialysis.    . Metformin Hcl Other (See Comments)    Reaction:  Renal failure----Pt states that "metformin will kill me".  . Aliskiren Rash  . Doxazosin Mesylate Rash    Family History  Problem Relation Age of Onset  . Coronary artery disease Father      Prior to Admission medications   Medication Sig Start Date End Date Taking? Authorizing Provider  amLODipine (NORVASC) 10 MG tablet Take 1 tablet (10 mg total) by mouth daily. 10/15/12  Yes Amy D Clegg, NP  aspirin 81 MG tablet Take 1 tablet (81 mg total) by mouth daily. 04/11/13  Yes Rhonda G Barrett, PA-C  cloNIDine (CATAPRES) 0.3 MG tablet Take 0.6 mg by mouth  2 (two) times daily.   Yes Historical Provider, MD  clopidogrel (PLAVIX) 75 MG tablet Take 75 mg by mouth daily.    Yes Historical Provider, MD  furosemide (LASIX) 20 MG tablet Take 1 tablet (20 mg total) by mouth daily as needed (for swelling). 03/03/14  Yes Wellington Hampshire, MD  hydrALAZINE (APRESOLINE) 50 MG tablet Take 1.5 tablets (75 mg total) by mouth 3 (three) times daily. 08/15/13 09/19/15 Yes Larey Dresser, MD  HYDROcodone-acetaminophen (NORCO/VICODIN) 5-325 MG per tablet Take 1-2 tablets by mouth every 6 (six) hours as needed for moderate pain or severe pain. 03/10/14  Yes Merryl Hacker, MD  Insulin Lispro Prot & Lispro (HUMALOG MIX 50/50 PEN [place]) Inject 18 Units into the skin 2 (two) times daily.    Yes Historical Provider, MD   lidocaine-prilocaine (EMLA) cream Apply 1 application topically as needed. Do not rub in. Cover with plastic. 03/11/14  Yes Ladell Pier, MD  pregabalin (LYRICA) 75 MG capsule Take 75 mg by mouth 3 (three) times daily.    Yes Historical Provider, MD  [place]   Yes Historical Provider, MD  prochlorperazine (COMPAZINE) 5 MG tablet Take 1 tablet (5 mg total) by mouth every 6 (six) hours as needed for nausea or vomiting. 03/11/14  Yes Owens Shark, NP  senna-docusate (SENOKOT-S) 8.6-50 MG per tablet Take 1 tablet by mouth 2 (two) times daily.   Yes Historical Provider, MD  traMADol (ULTRAM) 50 MG tablet Take 1 tablet (50 mg total) by mouth every 12 (twelve) hours as needed for moderate pain. 02/01/14  Yes Sheela Stack, MD   Physical Exam: Danley Danker Vitals:   03/17/14 2145  BP: 169/49  Pulse: 80  Temp:   Resp:     BP 169/49 mmHg  Pulse 80  Temp(Src) 98.3 F (36.8 C) (Oral)  Resp 23  SpO2 98%  General Appearance:    Alert, will answer orientation questions incorrectly: when asked where he is, he will reply "[place]" (I had not yet asked who the president was), no distress, appears stated age  Head:    Normocephalic, atraumatic  Eyes:    PERRL, EOMI, sclera non-icteric        Nose:   Nares without drainage or epistaxis. Mucosa, turbinates normal  Throat:   Moist mucous membranes. Oropharynx without erythema or exudate.  Neck:   Supple. No carotid bruits.  No thyromegaly.  No lymphadenopathy.   Back:     No CVA tenderness, no spinal tenderness  Lungs:     Clear to auscultation bilaterally, without wheezes, rhonchi or rales  Chest wall:    No tenderness to palpitation  Heart:    Regular rate and rhythm without murmurs, gallops, rubs  Abdomen:     Soft, non-tender, nondistended, normal bowel sounds, no organomegaly  Genitalia:    deferred  Rectal:    deferred  Extremities:   No clubbing, cyanosis or edema.  Pulses:   2+ and symmetric all extremities  Skin:    Skin color, texture, turgor normal, no rashes or lesions  Lymph nodes:   Cervical, supraclavicular, and axillary nodes normal  Neurologic:   CNII-XII intact. Normal strength, sensation and reflexes      throughout    Labs on Admission:  Basic Metabolic Panel:  Recent Labs Lab 03/17/14 1457 03/17/14 1500  NA 148* 149*  K 3.8 3.7  CL 109  --   CO2 21 23  GLUCOSE 227* 224*  BUN 42* 41.1*  CREATININE 1.45* 1.5*  CALCIUM 9.1 9.0   Liver Function Tests:  Recent Labs Lab 03/17/14 1457 03/17/14 1500  AST 26 16  ALT 11 12  ALKPHOS 134* 136  BILITOT 1.1 1.12  PROT 6.3 6.0*  ALBUMIN 3.5 3.4*   No results for input(s): LIPASE, AMYLASE in the last 168 hours. No results for input(s): AMMONIA in the last 168 hours. CBC:  Recent Labs Lab 03/13/14 1145 03/17/14 1457 03/17/14 1500  WBC 4.5 2.6* 3.3*  NEUTROABS 3.4 2.4 2.9  HGB 7.8* 7.9* 7.9*  HCT 24.6* 24.1* 25.6*  MCV 88.2 86.7 87.1  PLT 133* 111* 111*   Cardiac Enzymes:  Recent Labs Lab 03/17/14 1510  TROPONINI <0.30    BNP (last 3 results)  Recent Labs  08/15/13 0939  PROBNP 223.8   CBG:  Recent Labs Lab 03/17/14 1453  GLUCAP 218*    Radiological Exams on Admission: Ct Head Wo Contrast  03/17/2014   CLINICAL DATA:  Confusion, dehydration, altered mental status  EXAM: CT HEAD WITHOUT CONTRAST  TECHNIQUE: Contiguous axial images were obtained from the base of the skull through the vertex without intravenous contrast.  COMPARISON:  05/05/2012 and 8/ 17/12  FINDINGS: No skull fracture is noted. There is mild posterior mucosal thickening left maxillary sinus. Left nasal septum deviation. The mastoid air cells are unremarkable.  No intracranial hemorrhage, mass effect or midline shift. Mild cerebral atrophy. Mild periventricular white matter decreased attenuation probable due to chronic small vessel ischemic changes. There is moderate cerebellar atrophy. No acute cortical infarction. No mass lesion is noted on  this unenhanced scan. Atherosclerotic calcifications of carotid siphon and vertebral arteries.  IMPRESSION: No acute intracranial abnormality. Mild mucosal thickening posterior aspect of the left maxillary sinus. Mild cerebral atrophy. Moderate cerebral atrophy. Mild periventricular white matter decreased attenuation probable due to chronic small vessel ischemic changes.   Electronically Signed   By: Lahoma Crocker M.D.   On: 03/17/2014 15:14   Dg Chest Port 1 View  03/17/2014   CLINICAL DATA:  GE junction cancer. Nausea, vomiting and dehydration. Chemotherapy last week. Altered mental status, confusion.  EXAM: PORTABLE CHEST - 1 VIEW  COMPARISON:  03/10/2014  FINDINGS: Right Port-A-Cath remains in place, unchanged. Right Port-A-Cath remains in place, unchanged. Left basilar airspace opacity again noted, stable. No confluent opacity on the right. Mild interstitial prominence throughout the lungs. Cannot completely exclude interstitial edema. Calcified pleural plaques bilaterally.  IMPRESSION: Continued left basilar atelectasis or infiltrate.  Increasing interstitial prominence. Cannot exclude interstitial edema.   Electronically Signed   By: Rolm Baptise M.D.   On: 03/17/2014 16:11    EKG: Independently reviewed.  Assessment/Plan Principal Problem:   Acute encephalopathy Active Problems:   Chronic kidney disease, stage III (moderate)   Esophageal cancer   Dehydration   Hypertensive urgency, malignant   1. Acute encephalopathy - Likely multifactorial 1. Possible PRES / hypertensive encephalopathy 1. Patient was put on cardene gtt by EDP after his PO BP meds failed to significantly lower his BP, it appears that they are now having some effect and cardene is being titrated down.  Goal BPs of about 170 to 180 at this point. Will continue home BP meds except will just leave him at max dose (0.3 mg BID) of clonidine, his med rec has him taking 0.6mg  BID clonidine but want to verify this with his pharmacy  before going above the FDA approved max dose. 2. Dehydration, Hypernatremia 1. Patient on IVF for rehydration, monitor intake and  output 2. Foley catheter being placed as his bladder scan in the ED showed 950 cc of urine! 3. Clear liquid diet ordered. 3. Due to chemo drugs 1. Oncology is aware of patients admission to hospital (they are the ones who sent him in) and should be evaluating him tomorrow.  Please see their notes. 2. Esophageal cancer - oncology aware of admit 3. CKD stage 3 - creatinine is baseline, his BUN however is significantly elevated, continue to monitor and getting foley in place as above. 4. Chronic PAD - continue ASA/Plavix 5. DVT ppx - SCDs due to presence of port associated hematoma    Code Status: Full Code  Family Communication: Wife and son at bedside Disposition Plan: Admit to inpatient   Time spent: 66 min  GARDNER, JARED M. [facility] Pager [phone]  If 7AM-7PM, please contact the day team taking care of the patient [email or web address] Password [email or web address] 03/17/2014, 10:07 PM

## 2014-03-17 NOTE — Telephone Encounter (Signed)
She reports her transportation can get [name] to the office by 12:00 today.

## 2014-03-17 NOTE — ED Notes (Signed)
Bladder scan: 951ml, Dr Hurley Cisco notified. MD requested foley catheter be placed.

## 2014-03-17 NOTE — Assessment & Plan Note (Signed)
GE junction adenocarcinoma/esophageal cancer.  Patient completed cycle one of his etoposide/leucovorin/5-FU chemotherapy regimen on 03/11/2014.  This regimen is cycled every 28 days.

## 2014-03-18 ENCOUNTER — Encounter (INDEPENDENT_AMBULATORY_CARE_PROVIDER_SITE_OTHER): Payer: Medicare Other | Admitting: Ophthalmology

## 2014-03-18 ENCOUNTER — Other Ambulatory Visit: Payer: Self-pay

## 2014-03-18 DIAGNOSIS — R6510 Systemic inflammatory response syndrome (SIRS) of non-infectious origin without acute organ dysfunction: Secondary | ICD-10-CM

## 2014-03-18 DIAGNOSIS — C772 Secondary and unspecified malignant neoplasm of intra-abdominal lymph nodes: Secondary | ICD-10-CM

## 2014-03-18 DIAGNOSIS — R1310 Dysphagia, unspecified: Secondary | ICD-10-CM

## 2014-03-18 DIAGNOSIS — E785 Hyperlipidemia, unspecified: Secondary | ICD-10-CM

## 2014-03-18 DIAGNOSIS — I739 Peripheral vascular disease, unspecified: Secondary | ICD-10-CM

## 2014-03-18 DIAGNOSIS — C160 Malignant neoplasm of cardia: Secondary | ICD-10-CM

## 2014-03-18 DIAGNOSIS — R4182 Altered mental status, unspecified: Secondary | ICD-10-CM

## 2014-03-18 DIAGNOSIS — D61810 Antineoplastic chemotherapy induced pancytopenia: Secondary | ICD-10-CM

## 2014-03-18 DIAGNOSIS — C787 Secondary malignant neoplasm of liver and intrahepatic bile duct: Secondary | ICD-10-CM

## 2014-03-18 DIAGNOSIS — E1140 Type 2 diabetes mellitus with diabetic neuropathy, unspecified: Secondary | ICD-10-CM

## 2014-03-18 DIAGNOSIS — N179 Acute kidney failure, unspecified: Secondary | ICD-10-CM | POA: Diagnosis present

## 2014-03-18 DIAGNOSIS — D6481 Anemia due to antineoplastic chemotherapy: Secondary | ICD-10-CM

## 2014-03-18 DIAGNOSIS — N189 Chronic kidney disease, unspecified: Secondary | ICD-10-CM

## 2014-03-18 LAB — BASIC METABOLIC PANEL
Anion gap: 15 (ref 5–15)
BUN: 38 mg/dL — ABNORMAL HIGH (ref 6–23)
CALCIUM: 8.4 mg/dL (ref 8.4–10.5)
CO2: 21 meq/L (ref 19–32)
Chloride: 114 mEq/L — ABNORMAL HIGH (ref 96–112)
Creatinine, Ser: 1.18 mg/dL (ref 0.50–1.35)
GFR calc Af Amer: 64 mL/min — ABNORMAL LOW (ref >90)
GFR calc non Af Amer: 56 mL/min — ABNORMAL LOW (ref >90)
GLUCOSE: 264 mg/dL — AB (ref 70–99)
POTASSIUM: 3.4 meq/L — AB (ref 3.7–5.3)
Sodium: 150 mEq/L — ABNORMAL HIGH (ref 137–147)

## 2014-03-18 LAB — DIFFERENTIAL
Basophils Absolute: 0 10*3/uL (ref 0.0–0.1)
Basophils Relative: 0 % (ref 0–1)
EOS PCT: 0 % (ref 0–5)
Eosinophils Absolute: 0 10*3/uL (ref 0.0–0.7)
LYMPHS ABS: 0.3 10*3/uL — AB (ref 0.7–4.0)
Lymphocytes Relative: 25 % (ref 12–46)
MONOS PCT: 3 % (ref 3–12)
Monocytes Absolute: 0 10*3/uL — ABNORMAL LOW (ref 0.1–1.0)
Neutro Abs: 0.7 10*3/uL — ABNORMAL LOW (ref 1.7–7.7)
Neutrophils Relative %: 72 % (ref 43–77)

## 2014-03-18 LAB — CBC
HCT: 23.3 % — ABNORMAL LOW (ref 39.0–52.0)
HEMOGLOBIN: 7.5 g/dL — AB (ref 13.0–17.0)
MCH: 28.1 pg (ref 26.0–34.0)
MCHC: 32.2 g/dL (ref 30.0–36.0)
MCV: 87.3 fL (ref 78.0–100.0)
Platelets: 72 10*3/uL — ABNORMAL LOW (ref 150–400)
RBC: 2.67 MIL/uL — AB (ref 4.22–5.81)
RDW: 14.8 % (ref 11.5–15.5)
WBC: 1 10*3/uL — CL (ref 4.0–10.5)

## 2014-03-18 LAB — TROPONIN I
Troponin I: <0.3 ng/mL (ref <0.30)
Troponin I: <0.3 ng/mL (ref <0.30)
Troponin I: <0.3 ng/mL (ref <0.30)

## 2014-03-18 LAB — MRSA PCR SCREENING: MRSA by PCR: NEGATIVE

## 2014-03-18 MED ORDER — KCL IN DEXTROSE-NACL 20-5-0.45 MEQ/L-%-% IV SOLN
INTRAVENOUS | Status: DC
  Administered 2014-03-18 (×2): via INTRAVENOUS
  Administered 2014-03-19: 100 mL/h via INTRAVENOUS
  Administered 2014-03-20 – 2014-03-25 (×11): via INTRAVENOUS
  Filled 2014-03-18 (×19): qty 1000

## 2014-03-18 MED ORDER — HYDRALAZINE HCL 20 MG/ML IJ SOLN
10.0000 mg | Freq: Four times a day (QID) | INTRAMUSCULAR | Status: DC | PRN
  Administered 2014-03-18 – 2014-03-19 (×4): 10 mg via INTRAVENOUS
  Filled 2014-03-18 (×4): qty 1

## 2014-03-18 MED ORDER — GLUCERNA SHAKE PO LIQD
237.0000 mL | Freq: Three times a day (TID) | ORAL | Status: DC
  Administered 2014-03-18 – 2014-03-24 (×10): 237 mL via ORAL
  Filled 2014-03-18 (×22): qty 237

## 2014-03-18 MED ORDER — DEXTROSE 5 % IV SOLN
1.0000 g | INTRAVENOUS | Status: DC
  Administered 2014-03-18 – 2014-03-20 (×3): 1 g via INTRAVENOUS
  Filled 2014-03-18 (×4): qty 1

## 2014-03-18 MED ORDER — VANCOMYCIN HCL IN DEXTROSE 1-5 GM/200ML-% IV SOLN
1000.0000 mg | INTRAVENOUS | Status: DC
  Administered 2014-03-18: 1000 mg via INTRAVENOUS

## 2014-03-18 NOTE — Progress Notes (Signed)
ANTIBIOTIC CONSULT NOTE  Pharmacy Consult for Vancomycin / Cefepime Indication: HCAP / sepsis  Allergies  Allergen Reactions  . Contrast Media [Iodinated Diagnostic Agents] Other (See Comments)    Reaction:  History of renal failure following contrast requiring temporary dialysis.    . Metformin Hcl Other (See Comments)    Reaction:  Renal failure----Pt states that "metformin will kill me".  . Aliskiren Rash  . Doxazosin Mesylate Rash    Patient Measurements: Height: 5\' 11"  (180.3 cm) Weight: 154 lb 8.7 oz (70.1 kg) IBW/kg (Calculated) : 75.3   Vital Signs: Temp: 97.9 F (36.6 C) (11/11 0800) Temp Source: Oral (11/11 0800) BP: 163/39 mmHg (11/11 0700) Pulse Rate: 94 (11/11 0700) Intake/Output from previous day: 11/10 0701 - 11/11 0700 In: 1276.7 [I.V.:1276.7] Out: 1250 [Urine:1250] Intake/Output from this shift:    Labs:  Recent Labs  03/17/14 1457 03/17/14 1500 03/17/14 1500 03/18/14 0332  WBC 2.6* 3.3*  --  1.0*  HGB 7.9* 7.9*  --  7.5*  PLT 111* 111*  --  72*  CREATININE 1.45*  --  1.5* 1.18   Estimated Creatinine Clearance: 47.9 mL/min (by C-G formula based on Cr of 1.18). No results for input(s): VANCOTROUGH, VANCOPEAK, VANCORANDOM, GENTTROUGH, GENTPEAK, GENTRANDOM, TOBRATROUGH, TOBRAPEAK, TOBRARND, AMIKACINPEAK, AMIKACINTROU, AMIKACIN in the last 72 hours.   Microbiology: Recent Results (from the past 720 hour(s))  MRSA PCR Screening     Status: None   Collection Time: 03/17/14 11:32 PM  Result Value Ref Range Status   MRSA by PCR NEGATIVE NEGATIVE Final    Comment:        The GeneXpert MRSA Assay (FDA approved for NASAL specimens only), is one component of a comprehensive MRSA colonization surveillance program. It is not intended to diagnose MRSA infection nor to guide or monitor treatment for MRSA infections.     Medical History: Past Medical History  Diagnosis Date  . Carotid stenosis     u/s 5/11: R 0-39% L 40-59% (stable)  . HTN  (hypertension)     refractory. unable to cardura in past due to hypotension  . Edema   . PAD (peripheral artery disease)   . Cerebrovascular disease, unspecified   . HLD (hyperlipidemia)   . Bradycardia   . Diabetes mellitus type II   . Hyperkalemia   . Shortness of breath   . Arthritis     " IN MY HANDS "  . Chronic kidney disease, stage III (moderate)   . Stroke   . Cancer     esophageal.     Medications:  Anti-infectives    Start     Dose/Rate Route Frequency Ordered Stop   03/18/14 1600  vancomycin (VANCOCIN) IVPB 1000 mg/200 mL premix  Status:  Discontinued     1,000 mg200 mL/hr over 60 Minutes Intravenous Every 24 hours 03/17/14 1638 03/18/14 0905   03/17/14 1700  vancomycin (VANCOCIN) IVPB 1000 mg/200 mL premix     1,000 mg200 mL/hr over 60 Minutes Intravenous STAT 03/17/14 1638 03/17/14 1813   03/17/14 1630  ceFEPIme (MAXIPIME) 1 g in dextrose 5 % 50 mL IVPB     1 g100 mL/hr over 30 Minutes Intravenous  Once 03/17/14 1618 03/17/14 1710     Assessment: 80yo M w/ AMS and poor intake. Currently undergoing chemotherapy for GE junction cancer, last given on 11/6. Pharmacy consulted to dose Vancomycin and Cefepime  11/10 >> Cefepime >> 11/10 >> Vanc >>    Tmax: AF WBCs: 1 Neulasta on 11/7. Renal: CKD.  SCr 1.18(baseline appears ~1.5), 47CG/N  Goal of Therapy:  Vancomycin trough level 15-20 mcg/ml  Appropriate antibiotic dosing for renal function; eradication of infection  Plan:   Start Cefepime 1g IV Q24H (first dose given in ER)  Continue Vancomycin 1g IV q24h.  Measure Vanc trough at steady state.  Follow up renal fxn and culture results.  Kizzie Furnish, PharmD Pager: [phone] 03/18/2014 9:30 AM

## 2014-03-18 NOTE — Progress Notes (Signed)
Patient's watch sent home with his wife.

## 2014-03-18 NOTE — Progress Notes (Signed)
Forrest Moron, NP called about patients complaint of chest pain. EKG ordered along with troponins. EKG was done and first troponin came back negative. Will continue to monitor.

## 2014-03-18 NOTE — Progress Notes (Signed)
[facility] PROGRESS NOTE  [name] FBP:[id number] DOB: Feb 20, 1930 DOA: 03/17/2014 PCP: Sheela Stack, MD  Assessment/Plan:  Principal Problem:   Acute encephalopathy - Agree that this is most likely multifactorial. In the setting of dehydration and uncontrolled HTN (improved values today) - Still having poor oral intake and still confused on evaluation. - Will place on D5 1/2 NS with KCL  SIRs - In setting of neutropenia and confusion after chemotherapy - Will cover with Vancomycin and Cefepime at this moment. - continue to monitor cbc levels.  Active Problems:    Esophageal cancer - Pt to f/u with oncologist on discharge for further evaluation and recommendations. - If WBC levels continue to trend down will consult oncology.    Dehydration - Change fluids to MIVF's today. Please see order for details    Hypertensive urgency, malignant - Continue home regimen - Will d/c cardene - Hydralazine PRN elevated BP, see orders for details    Acute renal failure on chronic stage III - Most likely prerenal etiology given h/o poor oral intake and elevated bun/creatinine ratio  PAD - stable, will continue aspirin and plavix  Code Status: full Family Communication: No family at bedside Disposition Plan: Stepdown monitoring   Consultants:  None  Procedures:  None  Antibiotics:  Vancomycin  Cefepime  HPI/Subjective: Pt has no new complaints. No acute issues overnight reported to me by patient.  Objective: Filed Vitals:   03/18/14 0800  BP:   Pulse:   Temp: 97.9 F (36.6 C)  Resp:     Intake/Output Summary (Last 24 hours) at 03/18/14 0853 Last data filed at 03/18/14 0600  Gross per 24 hour  Intake 1276.67 ml  Output   1250 ml  Net  26.67 ml   Filed Weights   03/17/14 2327  Weight: 70.1 kg (154 lb 8.7 oz)    Exam:   General:  Pt in nad, alert and awake. Oriented to person but not place or time  Cardiovascular: rrr, no  mrg  Respiratory: cta bl, no wheezes  Abdomen: soft, ND, NT  Musculoskeletal: no clubbing   Data Reviewed: Basic Metabolic Panel:  Recent Labs Lab 03/17/14 1457 03/17/14 1500 03/18/14 0332  NA 148* 149* 150*  K 3.8 3.7 3.4*  CL 109  --  114*  CO2 21 23 21   GLUCOSE 227* 224* 264*  BUN 42* 41.1* 38*  CREATININE 1.45* 1.5* 1.18  CALCIUM 9.1 9.0 8.4   Liver Function Tests:  Recent Labs Lab 03/17/14 1457 03/17/14 1500  AST 26 16  ALT 11 12  ALKPHOS 134* 136  BILITOT 1.1 1.12  PROT 6.3 6.0*  ALBUMIN 3.5 3.4*   No results for input(s): LIPASE, AMYLASE in the last 168 hours. No results for input(s): AMMONIA in the last 168 hours. CBC:  Recent Labs Lab 03/13/14 1145 03/17/14 1457 03/17/14 1500 03/18/14 0332  WBC 4.5 2.6* 3.3* 1.0*  NEUTROABS 3.4 2.4 2.9  --   HGB 7.8* 7.9* 7.9* 7.5*  HCT 24.6* 24.1* 25.6* 23.3*  MCV 88.2 86.7 87.1 87.3  PLT 133* 111* 111* 72*   Cardiac Enzymes:  Recent Labs Lab 03/17/14 1510 03/18/14 0332  TROPONINI <0.30 <0.30   BNP (last 3 results)  Recent Labs  08/15/13 0939  PROBNP 223.8   CBG:  Recent Labs Lab 03/17/14 1453  GLUCAP 218*    Recent Results (from the past 240 hour(s))  MRSA PCR Screening     Status: None   Collection Time: 03/17/14 11:32 PM  Result Value Ref Range Status   MRSA by PCR NEGATIVE NEGATIVE Final    Comment:        The GeneXpert MRSA Assay (FDA approved for NASAL specimens only), is one component of a comprehensive MRSA colonization surveillance program. It is not intended to diagnose MRSA infection nor to guide or monitor treatment for MRSA infections.      Studies: Ct Head Wo Contrast  03/17/2014   CLINICAL DATA:  Confusion, dehydration, altered mental status  EXAM: CT HEAD WITHOUT CONTRAST  TECHNIQUE: Contiguous axial images were obtained from the base of the skull through the vertex without intravenous contrast.  COMPARISON:  05/05/2012 and 8/ 17/12  FINDINGS: No skull  fracture is noted. There is mild posterior mucosal thickening left maxillary sinus. Left nasal septum deviation. The mastoid air cells are unremarkable.  No intracranial hemorrhage, mass effect or midline shift. Mild cerebral atrophy. Mild periventricular white matter decreased attenuation probable due to chronic small vessel ischemic changes. There is moderate cerebellar atrophy. No acute cortical infarction. No mass lesion is noted on this unenhanced scan. Atherosclerotic calcifications of carotid siphon and vertebral arteries.  IMPRESSION: No acute intracranial abnormality. Mild mucosal thickening posterior aspect of the left maxillary sinus. Mild cerebral atrophy. Moderate cerebral atrophy. Mild periventricular white matter decreased attenuation probable due to chronic small vessel ischemic changes.   Electronically Signed   By: Lahoma Crocker M.D.   On: 03/17/2014 15:14   Dg Chest Port 1 View  03/17/2014   CLINICAL DATA:  GE junction cancer. Nausea, vomiting and dehydration. Chemotherapy last week. Altered mental status, confusion.  EXAM: PORTABLE CHEST - 1 VIEW  COMPARISON:  03/10/2014  FINDINGS: Right Port-A-Cath remains in place, unchanged. Right Port-A-Cath remains in place, unchanged. Left basilar airspace opacity again noted, stable. No confluent opacity on the right. Mild interstitial prominence throughout the lungs. Cannot completely exclude interstitial edema. Calcified pleural plaques bilaterally.  IMPRESSION: Continued left basilar atelectasis or infiltrate.  Increasing interstitial prominence. Cannot exclude interstitial edema.   Electronically Signed   By: Rolm Baptise M.D.   On: 03/17/2014 16:11    Scheduled Meds: . amLODipine  10 mg Oral Daily  . aspirin EC  81 mg Oral Daily  . cloNIDine  0.3 mg Oral BID  . clopidogrel  75 mg Oral Q breakfast  . hydrALAZINE  75 mg Oral TID  . niCARDipine  3-15 mg/hr Intravenous Once  . pregabalin  75 mg Oral TID  . senna-docusate  1 tablet Oral BID  .  vancomycin  1,000 mg Intravenous Q24H   Continuous Infusions: . dextrose 5 % and 0.45 % NaCl with KCl 20 mEq/L 100 mL/hr at 03/18/14 0837     Time spent: > 35 minutes    Velvet Bathe  [facility] Pager [phone]. If 7PM-7AM, please contact night-coverage at [email or web address], password [email or web address] 03/18/2014, 8:53 AM  LOS: 1 day

## 2014-03-18 NOTE — Progress Notes (Signed)
INITIAL NUTRITION ASSESSMENT  DOCUMENTATION CODES Per approved criteria  -Severe malnutrition in the context of chronic illness  Pt meets criteria for severe MALNUTRITION in the context of chronic illness as evidenced by PO intake < 75% for > one month, 8% body weight loss in three months.   INTERVENTION: -Recommend Glucerna Shake po TID, each supplement provides 220 kcal and 10 grams of protein -Diet advancement per MD -Encouraged intake and reviewed alternative ways to incorporate supplement -RD to continue to monitor  NUTRITION DIAGNOSIS: Inadequate oral intake related to decreased appetite as evidenced by PO intake < 75%, 14 lb weight loss.   Goal: Pt to meet >/= 90% of their estimated nutrition needs    Monitor:  Diet order, total protein/energy intake, labs, weight, swallow profile  Reason for Assessment: MST  80 y.o. male  Admitting Dx: Acute encephalopathy  ASSESSMENT: [name] is a 80 y.o. male who is sent in from his oncologists office today for AMS. He has been confused progressively for the past few days since having 3 days of chemotherapy last week.  -Pt reported ongoing decreased appetite. Diet recall indicates pt consuming two Glucerna supplement daily with minimal intake of solid foods. Was seen by oncology outpatient RD on 03/09/2014; recommended increasing intake of supplement to TID and incorporating high protein/kcal blended foods and snacks -Experiencing ongoing weight loss, usual body weight is around 188 lbs in 11/2013, and weighed 174 lbs in 02/2014, indicating a 15 lb weight loss in 3 month (8% body weight loss, severe for time frame) Current weight likely affected by dehydration status  -Pt tolerating clear liquid diet, consuming > 50% with some feeding assistance from RN. AMS improving with hydration -Pt was willing to drink vanilla Glucerna with encouragement from wife  Height: Ht Readings from Last 1 Encounters:  03/17/14 5\' 11"  (1.803 m)     Weight: Wt Readings from Last 1 Encounters:  03/17/14 154 lb 8.7 oz (70.1 kg)    Ideal Body Weight: 172 lb  % Ideal Body Weight: 90%  Wt Readings from Last 10 Encounters:  03/17/14 154 lb 8.7 oz (70.1 kg)  03/09/14 174 lb (78.926 kg)  03/04/14 174 lb 9.6 oz (79.198 kg)  03/03/14 178 lb (80.74 kg)  02/23/14 177 lb 1.6 oz (80.332 kg)  01/31/14 169 lb 1.6 oz (76.703 kg)  12/02/13 189 lb (85.73 kg)  10/01/13 189 lb (85.73 kg)  09/02/13 187 lb 1.9 oz (84.877 kg)  08/15/13 188 lb 8 oz (85.503 kg)    Usual Body Weight: 188 lb  % Usual Body Weight: 82%  BMI:  Body mass index is 21.56 kg/(m^2).  Estimated Nutritional Needs: Kcal: 2100-2300 Protein: 105-115 gram Fluid: >/= 2100 ml daily  Skin: ecchymosis  Diet Order: Diet full liquid  EDUCATION NEEDS: -Education needs addressed   Intake/Output Summary (Last 24 hours) at 03/18/14 1239 Last data filed at 03/18/14 0600  Gross per 24 hour  Intake 1276.67 ml  Output   1250 ml  Net  26.67 ml    Last BM: PTA, has hx of constipation   Labs:   Recent Labs Lab 03/17/14 1457 03/17/14 1500 03/18/14 0332  NA 148* 149* 150*  K 3.8 3.7 3.4*  CL 109  --  114*  CO2 21 23 21   BUN 42* 41.1* 38*  CREATININE 1.45* 1.5* 1.18  CALCIUM 9.1 9.0 8.4  GLUCOSE 227* 224* 264*    CBG (last 3)   Recent Labs  03/17/14 1453  GLUCAP 218*  Scheduled Meds: . amLODipine  10 mg Oral Daily  . aspirin EC  81 mg Oral Daily  . ceFEPime (MAXIPIME) IV  1 g Intravenous Q24H  . cloNIDine  0.3 mg Oral BID  . clopidogrel  75 mg Oral Q breakfast  . feeding supplement (GLUCERNA SHAKE)  237 mL Oral TID BM  . hydrALAZINE  75 mg Oral TID  . niCARDipine  3-15 mg/hr Intravenous Once  . pregabalin  75 mg Oral TID  . senna-docusate  1 tablet Oral BID    Continuous Infusions: . dextrose 5 % and 0.45 % NaCl with KCl 20 mEq/L 100 mL/hr at 03/18/14 5366    Past Medical History  Diagnosis Date  . Carotid stenosis     u/s 5/11: R  0-39% L 40-59% (stable)  . HTN (hypertension)     refractory. unable to cardura in past due to hypotension  . Edema   . PAD (peripheral artery disease)   . Cerebrovascular disease, unspecified   . HLD (hyperlipidemia)   . Bradycardia   . Diabetes mellitus type II   . Hyperkalemia   . Shortness of breath   . Arthritis     " IN MY HANDS "  . Chronic kidney disease, stage III (moderate)   . Stroke   . Cancer     esophageal.     Past Surgical History  Procedure Laterality Date  . Carotid endarterectomy  1/01    right  . Balloon angioplasty, artery Bilateral 04/09/2013    LEFT ILIAC & RT COMMON ILIAC     DR ARIDA    . Femoral artery stent  ?   2010    DR COOPER  . Esophagogastroduodenoscopy N/A 01/31/2014    Procedure: ESOPHAGOGASTRODUODENOSCOPY (EGD);  Surgeon: Milus Banister, MD;  Location: [facility];  Service: Endoscopy;  Laterality: N/A;    Atlee Abide MS RD LDN Clinical Dietitian [phone]

## 2014-03-18 NOTE — Progress Notes (Signed)
IP PROGRESS NOTE  Subjective:   He was admitted yesterday with an altered mental status and increased dysphagia. He appears confused this morning. He denies pain. He reports no difficulty swallowing today. The RN staff reports he has been able to take pills.  Objective: Vital signs in last 24 hours: Blood pressure 163/39, pulse 94, temperature 97.9 F (36.6 C), temperature source Oral, resp. rate 23, height $RemoveBe'5\' 11"'[id number]$  (1.803 m), weight 154 lb 8.7 oz (70.1 kg), SpO2 94 %.  Intake/Output from previous day: 11/10 0701 - 11/11 0700 In: 1276.7 [I.V.:1276.7] Out: 1250 [Urine:1250]  Physical Exam:  HEENT: the tongue is dry, no thrush or ulcers Lungs: decreased breath sounds with coarse inspiratory rhonchi at the posterior bases, no respiratory distress Cardiac: regular rate and rhythm Abdomen: soft and nontender Extremities: no leg edema Skin: Erythematous rash in the left groin with superficial desquamation  Portacath/PICC-without erythema  Lab Results:  Recent Labs  03/17/14 1500 03/18/14 0332  WBC 3.3* 1.0*  HGB 7.9* 7.5*  HCT 25.6* 23.3*  PLT 111* 72*    BMET  Recent Labs  03/17/14 1457 03/17/14 1500 03/18/14 0332  NA 148* 149* 150*  K 3.8 3.7 3.4*  CL 109  --  114*  CO2 $Re'21 23 21  'fZs$ GLUCOSE 227* 224* 264*  BUN 42* 41.1* 38*  CREATININE 1.45* 1.5* 1.18  CALCIUM 9.1 9.0 8.4    Studies/Results: Ct Head Wo Contrast  03/17/2014   CLINICAL DATA:  Confusion, dehydration, altered mental status  EXAM: CT HEAD WITHOUT CONTRAST  TECHNIQUE: Contiguous axial images were obtained from the base of the skull through the vertex without intravenous contrast.  COMPARISON:  05/05/2012 and 8/ 17/12  FINDINGS: No skull fracture is noted. There is mild posterior mucosal thickening left maxillary sinus. Left nasal septum deviation. The mastoid air cells are unremarkable.  No intracranial hemorrhage, mass effect or midline shift. Mild cerebral atrophy. Mild periventricular white matter  decreased attenuation probable due to chronic small vessel ischemic changes. There is moderate cerebellar atrophy. No acute cortical infarction. No mass lesion is noted on this unenhanced scan. Atherosclerotic calcifications of carotid siphon and vertebral arteries.  IMPRESSION: No acute intracranial abnormality. Mild mucosal thickening posterior aspect of the left maxillary sinus. Mild cerebral atrophy. Moderate cerebral atrophy. Mild periventricular white matter decreased attenuation probable due to chronic small vessel ischemic changes.   Electronically Signed   By: Lahoma Crocker M.D.   On: 03/17/2014 15:14   Dg Chest Port 1 View  03/17/2014   CLINICAL DATA:  GE junction cancer. Nausea, vomiting and dehydration. Chemotherapy last week. Altered mental status, confusion.  EXAM: PORTABLE CHEST - 1 VIEW  COMPARISON:  03/10/2014  FINDINGS: Right Port-A-Cath remains in place, unchanged. Right Port-A-Cath remains in place, unchanged. Left basilar airspace opacity again noted, stable. No confluent opacity on the right. Mild interstitial prominence throughout the lungs. Cannot completely exclude interstitial edema. Calcified pleural plaques bilaterally.  IMPRESSION: Continued left basilar atelectasis or infiltrate.  Increasing interstitial prominence. Cannot exclude interstitial edema.   Electronically Signed   By: Rolm Baptise M.D.   On: 03/17/2014 16:11    Medications: I have reviewed the patient's current medications.  Assessment/Plan:  1. GE junction adenocarcinoma  EGD 01/31/2014 with an ulcerated circumferential 2-3 cm mass at the GE junction all on the esophageal side causing incomplete stricture.   Biopsy showed invasive poorly differentiated adenocarcinoma; no amplification of HER-2.   CT abdomen/pelvis 01/30/2014 showed new hypodensities in the liver concerning for metastatic disease.  PET scan 03/04/2014 confirmed a hypermetabolic GE junction mass, numerous hypermetabolic liver metastases, and  upper abdominal/retroperitoneal nodal metastases  Cycle 1 5-FU/leucovorin/etoposide11/08/2013 with Neulasta support 2. Dysphagia secondary to #1, progressive 3. Diabetes mellitus 4. Chronic kidney disease 5. Peripheral neuropathy related to diabetes 6. Peripheral arterial disease 7. Hypertension-Markedly elevated blood pressure on hospital admission 8. Hyperlipidemia 9. Admission 03/17/2014 with an altered mental status 10. Pancytopenia-baseline anemia/mild pancytopenia, complicated by chemotherapy.  Mr. Breon was admitted with an altered mental status and progressive dysphagia. A brain CT revealed no acute change and there is no apparent cause for the altered mental status upon review of the admission laboratories. He may have a systemic infection.he received Neulasta following chemotherapy, hopefully the white count will recover over the next few days.  Recommendations:  1. Check white cell differential 2. Continue broad spectrum intravenous antibiotics 3. Aspiration precautions 4. Check barium swallow for persistent dysphagia    LOS: 1 day   [place]  03/18/2014, 9:19 AM

## 2014-03-18 NOTE — Progress Notes (Signed)
Inpatient Diabetes Program Recommendations  AACE/ADA: New Consensus Statement on Inpatient Glycemic Control (2013)  Target Ranges:  Prepandial:   less than 140 mg/dL      Peak postprandial:   less than 180 mg/dL (1-2 hours)      Critically ill patients:  140 - 180 mg/dL     Results for [name], AMICO (MRN [id number]) as of 03/18/2014 07:35  Ref. Range 03/17/2014 14:57 03/17/2014 15:00 03/18/2014 03:32  Glucose Latest Range: 70-99 mg/dL 227 (H) 224 (H) 264 (H)     Admitted with Encephalopathy/ Dehydration/ HTN.  History of DM, HTN, Esophageal Cancer with Chemo.   Home DM Meds: Humalog 50/50 insulin- 18 units bid with meals   Current Insulin Orders: None    MD- Please start Novolog Sensitive SSI Q4 hours  May need basal insulin as well given he does get about 18 units long-acting insulin within his 50/50 insulin doses at home     Will follow Wyn Quaker RN, MSN, CDE Diabetes Coordinator Inpatient Diabetes Program Team Pager: [phone] (8a-10p)

## 2014-03-18 NOTE — Care Management Note (Signed)
    Page 1 of 2   03/18/2014     10:35:46 AM CARE MANAGEMENT NOTE 03/18/2014  Patient:  [name], [name]   Account Number:  [id number]  Date Initiated:  03/18/2014  Documentation initiated by:  Tequia Wolman  Subjective/Objective Assessment:   Acute encephalopathy  - Agree that this is most likely multifactorial. In the setting of dehydration and uncontrolled HTN (improved values today)  - Still having poor oral intake and still confused on evaluation.  - Will place on D5 1/2 NS     Action/Plan:   hx of ca of esophgus/undergoing chemo/lives at home with wife and has good support through adult children.  Pallaitive care consult?? Wife states he is ready to go home.   Anticipated DC Date:  03/21/2014   Anticipated DC Plan:  HOME W HOSPICE CARE  In-house referral  NA      DC Planning Services  CM consult      PAC Choice  NA   Choice offered to / List presented to:  NA   DME arranged  NA      DME agency  NA     [facility] arranged  NA      [place] agency  NA   Status of service:  In process, will continue to follow Medicare Important Message given?   (If response is "NO", the following Medicare IM given date fields will be blank) Date Medicare IM given:   Medicare IM given by:   Date Additional Medicare IM given:   Additional Medicare IM given by:    Discharge Disposition:    Per UR Regulation:  Reviewed for med. necessity/level of care/duration of stay  If discussed at [place] of Stay Meetings, dates discussed:    Comments:  [id number]/Srinika Delone Rosana Hoes, RN, BSN, CCM Chart reviewed. Discharge needs and patient's stay to be reviewed and followed by case manager.

## 2014-03-19 ENCOUNTER — Other Ambulatory Visit (HOSPITAL_BASED_OUTPATIENT_CLINIC_OR_DEPARTMENT_OTHER): Payer: Medicare Other

## 2014-03-19 DIAGNOSIS — D709 Neutropenia, unspecified: Secondary | ICD-10-CM

## 2014-03-19 DIAGNOSIS — E43 Unspecified severe protein-calorie malnutrition: Secondary | ICD-10-CM | POA: Insufficient documentation

## 2014-03-19 LAB — CBC WITH DIFFERENTIAL/PLATELET
BASOS PCT: 0 % (ref 0–1)
Basophils Absolute: 0 10*3/uL (ref 0.0–0.1)
EOS ABS: 0 10*3/uL (ref 0.0–0.7)
Eosinophils Relative: 0 % (ref 0–5)
HEMATOCRIT: 24.3 % — AB (ref 39.0–52.0)
Hemoglobin: 7.7 g/dL — ABNORMAL LOW (ref 13.0–17.0)
Lymphocytes Relative: 47 % — ABNORMAL HIGH (ref 12–46)
Lymphs Abs: 0.2 10*3/uL — ABNORMAL LOW (ref 0.7–4.0)
MCH: 28.4 pg (ref 26.0–34.0)
MCHC: 31.7 g/dL (ref 30.0–36.0)
MCV: 89.7 fL (ref 78.0–100.0)
Monocytes Absolute: 0 10*3/uL — ABNORMAL LOW (ref 0.1–1.0)
Monocytes Relative: 7 % (ref 3–12)
NEUTROS ABS: 0.3 10*3/uL — AB (ref 1.7–7.7)
Neutrophils Relative %: 46 % (ref 43–77)
Platelets: 61 10*3/uL — ABNORMAL LOW (ref 150–400)
RBC: 2.71 MIL/uL — ABNORMAL LOW (ref 4.22–5.81)
RDW: 14.9 % (ref 11.5–15.5)
WBC: 0.5 10*3/uL — CL (ref 4.0–10.5)

## 2014-03-19 LAB — GLUCOSE, CAPILLARY
GLUCOSE-CAPILLARY: 268 mg/dL — AB (ref 70–99)
GLUCOSE-CAPILLARY: 168 mg/dL — AB (ref 70–99)

## 2014-03-19 LAB — BASIC METABOLIC PANEL
Anion gap: 10 (ref 5–15)
BUN: 39 mg/dL — AB (ref 6–23)
CHLORIDE: 113 meq/L — AB (ref 96–112)
CO2: 23 meq/L (ref 19–32)
CREATININE: 1.17 mg/dL (ref 0.50–1.35)
Calcium: 7.8 mg/dL — ABNORMAL LOW (ref 8.4–10.5)
GFR calc Af Amer: 65 mL/min — ABNORMAL LOW (ref >90)
GFR calc non Af Amer: 56 mL/min — ABNORMAL LOW (ref >90)
GLUCOSE: 317 mg/dL — AB (ref 70–99)
Potassium: 3.8 mEq/L (ref 3.7–5.3)
Sodium: 146 mEq/L (ref 137–147)

## 2014-03-19 MED ORDER — VANCOMYCIN HCL IN DEXTROSE 750-5 MG/150ML-% IV SOLN
750.0000 mg | Freq: Two times a day (BID) | INTRAVENOUS | Status: DC
  Administered 2014-03-19 – 2014-03-21 (×6): 750 mg via INTRAVENOUS
  Filled 2014-03-19 (×6): qty 150

## 2014-03-19 MED ORDER — INSULIN ASPART 100 UNIT/ML ~~LOC~~ SOLN
0.0000 [IU] | Freq: Three times a day (TID) | SUBCUTANEOUS | Status: DC
  Administered 2014-03-19: 8 [IU] via SUBCUTANEOUS
  Administered 2014-03-19 – 2014-03-20 (×2): 3 [IU] via SUBCUTANEOUS
  Administered 2014-03-20: 8 [IU] via SUBCUTANEOUS
  Administered 2014-03-20: 3 [IU] via SUBCUTANEOUS
  Administered 2014-03-21: 5 [IU] via SUBCUTANEOUS
  Administered 2014-03-21: 3 [IU] via SUBCUTANEOUS
  Administered 2014-03-21: 11 [IU] via SUBCUTANEOUS
  Administered 2014-03-22 (×2): 5 [IU] via SUBCUTANEOUS
  Administered 2014-03-22 – 2014-03-23 (×2): 3 [IU] via SUBCUTANEOUS
  Administered 2014-03-23 (×2): 5 [IU] via SUBCUTANEOUS
  Administered 2014-03-24 (×2): 3 [IU] via SUBCUTANEOUS
  Administered 2014-03-24: 5 [IU] via SUBCUTANEOUS
  Administered 2014-03-25: 3 [IU] via SUBCUTANEOUS

## 2014-03-19 MED ORDER — HYDRALAZINE HCL 50 MG PO TABS
50.0000 mg | ORAL_TABLET | Freq: Four times a day (QID) | ORAL | Status: DC
  Administered 2014-03-19 – 2014-03-25 (×24): 50 mg via ORAL
  Filled 2014-03-19 (×29): qty 1

## 2014-03-19 MED ORDER — SPIRONOLACTONE 25 MG PO TABS
25.0000 mg | ORAL_TABLET | Freq: Every day | ORAL | Status: DC
  Administered 2014-03-19: 25 mg via ORAL
  Filled 2014-03-19: qty 1

## 2014-03-19 NOTE — Progress Notes (Signed)
Paged [name] at [phone] re: glucose >300 this morning in Labs.  There are no orders for insulin/correction.

## 2014-03-19 NOTE — Plan of Care (Signed)
Problem: Phase I Progression Outcomes Goal: Pain controlled with appropriate interventions Outcome: Progressing Pt denies pain during shift.

## 2014-03-19 NOTE — Clinical Documentation Improvement (Signed)
Possible Clinical Conditions? Chemotherapy Induced Pancytopenia Sepsis Other Condition Cannot Clinically Determine   Supporting Information:(As per notes) "The presentation with delirium and dehydration may be related to a sepsis syndrome. He now has severe neutropenia.He is currently at day 9 following the first cycle of &chemotherapy".  Risk Factors:"GE junction adenocarcinoma & Dysphagia secondary to #1"  Signs & Symptoms: Encephalopathy & dehydration.  Pancytopenia Diagnostics: Component     Latest Ref Rng 03/17/2014 03/17/2014 03/18/2014 03/19/2014         2:57 PM  3:00 PM    WBC     4.0 - 10.5 K/uL 2.6 (L) 3.3 (L) 1.0 (LL) 0.5 (LL)  RBC     4.22 - 5.81 MIL/uL 2.78 (L) 2.93 (L) 2.67 (L) 2.71 (L)  Hemoglobin     13.0 - 17.0 g/dL 7.9 (L) 7.9 (L) 7.5 (L) 7.7 (L)  HCT     39.0 - 52.0 % 24.1 (L) 25.6 (L) 23.3 (L) 24.3 (L)  Platelets     150 - 400 K/uL 111 (L) 111 (L) 72 (L) 61 (L)    Thank You, Alessandra Grout, RN, BSN, CCDS,Clinical Documentation Specialist:  [phone]  [phone]=[facility]- Health Information Management

## 2014-03-19 NOTE — Progress Notes (Signed)
ANTIBIOTIC CONSULT NOTE  Pharmacy Consult for Vancomycin / Cefepime Indication: HCAP / sepsis  Allergies  Allergen Reactions  . Contrast Media [Iodinated Diagnostic Agents] Other (See Comments)    Reaction:  History of renal failure following contrast requiring temporary dialysis.    . Metformin Hcl Other (See Comments)    Reaction:  Renal failure----Pt states that "metformin will kill me".  . Aliskiren Rash  . Doxazosin Mesylate Rash    Patient Measurements: Height: 5\' 11"  (180.3 cm) Weight: 159 lb 6.3 oz (72.3 kg) IBW/kg (Calculated) : 75.3   Vital Signs: Temp: 98.3 F (36.8 C) (11/12 0359) Temp Source: Oral (11/12 0359) BP: 191/46 mmHg (11/12 0600) Pulse Rate: 78 (11/12 0600) Intake/Output from previous day: 11/11 0701 - 11/12 0700 In: 2678.3 [P.O.:240; I.V.:2188.3; IV Piggyback:250] Out: 7096 [Urine:1435; Stool:1] Intake/Output from this shift:    Labs:  Recent Labs  03/17/14 1500 03/17/14 1500 03/18/14 0332 03/19/14 0344 03/19/14 0500  WBC 3.3*  --  1.0*  --  0.5*  HGB 7.9*  --  7.5*  --  7.7*  PLT 111*  --  72*  --  61*  CREATININE  --  1.5* 1.18 1.17  --    Estimated Creatinine Clearance: 49.8 mL/min (by C-G formula based on Cr of 1.17). No results for input(s): VANCOTROUGH, VANCOPEAK, VANCORANDOM, GENTTROUGH, GENTPEAK, GENTRANDOM, TOBRATROUGH, TOBRAPEAK, TOBRARND, AMIKACINPEAK, AMIKACINTROU, AMIKACIN in the last 72 hours.   Microbiology: Recent Results (from the past 720 hour(s))  MRSA PCR Screening     Status: None   Collection Time: 03/17/14 11:32 PM  Result Value Ref Range Status   MRSA by PCR NEGATIVE NEGATIVE Final    Comment:        The GeneXpert MRSA Assay (FDA approved for NASAL specimens only), is one component of a comprehensive MRSA colonization surveillance program. It is not intended to diagnose MRSA infection nor to guide or monitor treatment for MRSA infections.     Medical History: Past Medical History  Diagnosis Date  .  Carotid stenosis     u/s 5/11: R 0-39% L 40-59% (stable)  . HTN (hypertension)     refractory. unable to cardura in past due to hypotension  . Edema   . PAD (peripheral artery disease)   . Cerebrovascular disease, unspecified   . HLD (hyperlipidemia)   . Bradycardia   . Diabetes mellitus type II   . Hyperkalemia   . Shortness of breath   . Arthritis     " IN MY HANDS "  . Chronic kidney disease, stage III (moderate)   . Stroke   . Cancer     esophageal.     Medications:  Anti-infectives    Start     Dose/Rate Route Frequency Ordered Stop   03/18/14 2000  vancomycin (VANCOCIN) IVPB 1000 mg/200 mL premix     1,000 mg200 mL/hr over 60 Minutes Intravenous Every 24 hours 03/18/14 1928     03/18/14 1700  ceFEPIme (MAXIPIME) 1 g in dextrose 5 % 50 mL IVPB     1 g100 mL/hr over 30 Minutes Intravenous Every 24 hours 03/18/14 0931     03/18/14 1600  vancomycin (VANCOCIN) IVPB 1000 mg/200 mL premix  Status:  Discontinued     1,000 mg200 mL/hr over 60 Minutes Intravenous Every 24 hours 03/17/14 1638 03/18/14 0905   03/17/14 1700  vancomycin (VANCOCIN) IVPB 1000 mg/200 mL premix     1,000 mg200 mL/hr over 60 Minutes Intravenous STAT 03/17/14 1638 03/17/14 1813  03/17/14 1630  ceFEPIme (MAXIPIME) 1 g in dextrose 5 % 50 mL IVPB     1 g100 mL/hr over 30 Minutes Intravenous  Once 03/17/14 1618 03/17/14 1710     Assessment: 80yo M w/ AMS and poor intake. Currently undergoing chemotherapy for GE junction cancer, last given on 11/6. Pharmacy consulted to dose Vancomycin and Cefepime  11/10 >> Cefepime >> 11/10 >> Vanc >>    Tmax: AF WBCs: 0.5 Renal: SCr 1.17 47CG/N  Goal of Therapy:  Vancomycin trough level 15-20 mcg/ml  Appropriate antibiotic dosing for renal function; eradication of infection  Plan:   Increase Vancomycin to 750mg  IV Q12H for improved renal function  Measure Vanc trough at steady state.  Continue Cefepime 1g IV Q24H  Follow up renal fxn and culture  results.  Kizzie Furnish, PharmD Pager: [phone] 03/19/2014 8:08 AM

## 2014-03-19 NOTE — Progress Notes (Addendum)
IP PROGRESS NOTE  Subjective:   He is alert. No complaint. No dysphagia.  Objective: Vital signs in last 24 hours: Blood pressure 191/46, pulse 78, temperature 98.3 F (36.8 C), temperature source Oral, resp. rate 19, height _0  (1.803 m), weight 159 lb 6.3 oz (72.3 kg), SpO2 100 %.  Intake/Output from previous day: 11/11 0701 - 11/12 0700 In: 2678.3 [P.O.:240; I.V.:2188.3; IV Piggyback:250] Out: 6606 [Urine:1435; Stool:1]  Physical Exam:  HEENT: mild whitecoat over the tongue, no buccal thrush, no ulcers Lungs: decreased breath sounds at the bases, no respiratory distress Cardiac: regular rate and rhythm Abdomen: soft and nontender Extremities: no leg edema, trace pitting edema at the lower arm and hand bilaterally Skin: Erythematous rash in the left groin appears improved  Portacath/PICC-without erythema  Lab Results:  Recent Labs  03/18/14 0332 03/19/14 0500  WBC 1.0* 0.5*  HGB 7.5* 7.7*  HCT 23.3* 24.3*  PLT 72* 61*  ANC 0.3  BMET  Recent Labs  03/18/14 0332 03/19/14 0344  NA 150* 146  K 3.4* 3.8  CL 114* 113*  CO2 21 23  GLUCOSE 264* 317*  BUN 38* 39*  CREATININE 1.18 1.17  CALCIUM 8.4 7.8*    Studies/Results: Ct Head Wo Contrast  03/17/2014   CLINICAL DATA:  Confusion, dehydration, altered mental status  EXAM: CT HEAD WITHOUT CONTRAST  TECHNIQUE: Contiguous axial images were obtained from the base of the skull through the vertex without intravenous contrast.  COMPARISON:  05/05/2012 and 8/ 17/12  FINDINGS: No skull fracture is noted. There is mild posterior mucosal thickening left maxillary sinus. Left nasal septum deviation. The mastoid air cells are unremarkable.  No intracranial hemorrhage, mass effect or midline shift. Mild cerebral atrophy. Mild periventricular white matter decreased attenuation probable due to chronic small vessel ischemic changes. There is moderate cerebellar atrophy. No acute cortical infarction. No mass lesion is noted on  this unenhanced scan. Atherosclerotic calcifications of carotid siphon and vertebral arteries.  IMPRESSION: No acute intracranial abnormality. Mild mucosal thickening posterior aspect of the left maxillary sinus. Mild cerebral atrophy. Moderate cerebral atrophy. Mild periventricular white matter decreased attenuation probable due to chronic small vessel ischemic changes.   Electronically Signed   By: Lahoma Crocker M.D.   On: 03/17/2014 15:14   Dg Chest Port 1 View  03/17/2014   CLINICAL DATA:  GE junction cancer. Nausea, vomiting and dehydration. Chemotherapy last week. Altered mental status, confusion.  EXAM: PORTABLE CHEST - 1 VIEW  COMPARISON:  03/10/2014  FINDINGS: Right Port-A-Cath remains in place, unchanged. Right Port-A-Cath remains in place, unchanged. Left basilar airspace opacity again noted, stable. No confluent opacity on the right. Mild interstitial prominence throughout the lungs. Cannot completely exclude interstitial edema. Calcified pleural plaques bilaterally.  IMPRESSION: Continued left basilar atelectasis or infiltrate.  Increasing interstitial prominence. Cannot exclude interstitial edema.   Electronically Signed   By: Rolm Baptise M.D.   On: 03/17/2014 16:11    Medications: I have reviewed the patient's current medications.  Assessment/Plan:  1. GE junction adenocarcinoma  EGD 01/31/2014 with an ulcerated circumferential 2-3 cm mass at the GE junction all on the esophageal side causing incomplete stricture.   Biopsy showed invasive poorly differentiated adenocarcinoma; no amplification of HER-2.   CT abdomen/pelvis 01/30/2014 showed new hypodensities in the liver concerning for metastatic disease.  PET scan 03/04/2014 confirmed a hypermetabolic GE junction mass, numerous hypermetabolic liver metastases, and upper abdominal/retroperitoneal nodal metastases  Cycle 1 5-FU/leucovorin/etoposide11/08/2013 with Neulasta support 2. Dysphagia secondary to #1 3.  Diabetes  mellitus 4. Chronic kidney disease 5. Peripheral neuropathy related to diabetes 6. Peripheral arterial disease 7. Hypertension-Markedly elevated blood pressure on hospital admission 8. Hyperlipidemia 9. Admission 03/17/2014 with an altered mental status-improved 10. Pancytopenia-baseline anemia/mild pancytopenia, complicated by chemotherapy.now with severe neutropenia.  His mental status appears improved. The presentation with delirium and dehydration may be related to a sepsis syndrome. He now has severe neutropenia.He is currently at day 9 following the first cycle of chemotherapy.Hopefully the white count will recover over the next several days as he received Neulasta.  Recommendations:  1. Continue broad-spectrum IV antibiotics 2. Daily CBC with differential 3. Hold aspirin and Plavix if the platelet count falls further     LOS: 2 days   [place]  03/19/2014, 8:07 AM

## 2014-03-19 NOTE — Progress Notes (Signed)
[facility] PROGRESS NOTE  [name]:[id number] DOB: 1929-12-11 DOA: 03/17/2014 PCP: Sheela Stack, MD  Assessment/Plan:  Principal Problem:   Acute encephalopathy - Agree that this is most likely multifactorial. In the setting of dehydration and uncontrolled HTN (improved values today) - Pt has improved mentation today.  SIRs - In setting of neutropenia and confusion after chemotherapy - Will continue broad coverage with Vancomycin and Cefepime at this moment. - continue to monitor cbc levels.  Active Problems:    Esophageal cancer - Pt to f/u with oncologist on discharge for further evaluation and recommendations. - Oncology on board. Would like to thank Dr. Benay Spice for his assistance with this case.  Chemotherapy associated pancytopenia - Oncology on board and assisting - will continue to monitor cbc and transfuse as necessary - Agree we may have to hold aspirin and plavix should platelets continue to drop    Dehydration - Continue MIVF's until patient's po intake is improvement    Hypertensive urgency, malignant - Cardene d/c'd - Hydralazine PRN elevated BP - Increased oral hydralazine regimen - Added spironolactone in the setting of difficult to control blood pressure despite 3 different antihypertensive medications.    Acute renal failure on chronic stage III - Most likely prerenal etiology given h/o poor oral intake and elevated bun/creatinine ratio - improved with IVF rehydration  PAD - stable, will continue aspirin and plavix  Code Status: full Family Communication: No family at bedside Disposition Plan: Stepdown monitoring   Consultants:  None  Procedures:  None  Antibiotics:  Vancomycin  Cefepime  HPI/Subjective: Pt states that he feels better. He is more alert today.  Objective: Filed Vitals:   03/19/14 0750  BP:   Pulse:   Temp: 98.9 F (37.2 C)  Resp:     Intake/Output Summary (Last 24 hours) at 03/19/14  1005 Last data filed at 03/19/14 0600  Gross per 24 hour  Intake   2390 ml  Output   1436 ml  Net    954 ml   Filed Weights   03/17/14 2327 03/19/14 0046  Weight: 70.1 kg (154 lb 8.7 oz) 72.3 kg (159 lb 6.3 oz)    Exam:   General:  Pt in nad, alert and awake.   Cardiovascular: rrr, no mrg  Respiratory: cta bl, no wheezes  Abdomen: soft, ND, NT  Musculoskeletal: no clubbing   Data Reviewed: Basic Metabolic Panel:  Recent Labs Lab 03/17/14 1457 03/17/14 1500 03/18/14 0332 03/19/14 0344  NA 148* 149* 150* 146  K 3.8 3.7 3.4* 3.8  CL 109  --  114* 113*  CO2 21 23 21 23   GLUCOSE 227* 224* 264* 317*  BUN 42* 41.1* 38* 39*  CREATININE 1.45* 1.5* 1.18 1.17  CALCIUM 9.1 9.0 8.4 7.8*   Liver Function Tests:  Recent Labs Lab 03/17/14 1457 03/17/14 1500  AST 26 16  ALT 11 12  ALKPHOS 134* 136  BILITOT 1.1 1.12  PROT 6.3 6.0*  ALBUMIN 3.5 3.4*   No results for input(s): LIPASE, AMYLASE in the last 168 hours. No results for input(s): AMMONIA in the last 168 hours. CBC:  Recent Labs Lab 03/13/14 1145 03/17/14 1457 03/17/14 1500 03/18/14 0332 03/19/14 0500  WBC 4.5 2.6* 3.3* 1.0* 0.5*  NEUTROABS 3.4 2.4 2.9 0.7* 0.3*  HGB 7.8* 7.9* 7.9* 7.5* 7.7*  HCT 24.6* 24.1* 25.6* 23.3* 24.3*  MCV 88.2 86.7 87.1 87.3 89.7  PLT 133* 111* 111* 72* 61*   Cardiac Enzymes:  Recent Labs Lab 03/17/14  1510 03/18/14 0332 03/18/14 1044 03/18/14 1637  TROPONINI <0.30 <0.30 <0.30 <0.30   BNP (last 3 results)  Recent Labs  08/15/13 0939  PROBNP 223.8   CBG:  Recent Labs Lab 03/17/14 1453  GLUCAP 218*    Recent Results (from the past 240 hour(s))  MRSA PCR Screening     Status: None   Collection Time: 03/17/14 11:32 PM  Result Value Ref Range Status   MRSA by PCR NEGATIVE NEGATIVE Final    Comment:        The GeneXpert MRSA Assay (FDA approved for NASAL specimens only), is one component of a comprehensive MRSA colonization surveillance program. It is  not intended to diagnose MRSA infection nor to guide or monitor treatment for MRSA infections.      Studies: Ct Head Wo Contrast  03/17/2014   CLINICAL DATA:  Confusion, dehydration, altered mental status  EXAM: CT HEAD WITHOUT CONTRAST  TECHNIQUE: Contiguous axial images were obtained from the base of the skull through the vertex without intravenous contrast.  COMPARISON:  05/05/2012 and 8/ 17/12  FINDINGS: No skull fracture is noted. There is mild posterior mucosal thickening left maxillary sinus. Left nasal septum deviation. The mastoid air cells are unremarkable.  No intracranial hemorrhage, mass effect or midline shift. Mild cerebral atrophy. Mild periventricular white matter decreased attenuation probable due to chronic small vessel ischemic changes. There is moderate cerebellar atrophy. No acute cortical infarction. No mass lesion is noted on this unenhanced scan. Atherosclerotic calcifications of carotid siphon and vertebral arteries.  IMPRESSION: No acute intracranial abnormality. Mild mucosal thickening posterior aspect of the left maxillary sinus. Mild cerebral atrophy. Moderate cerebral atrophy. Mild periventricular white matter decreased attenuation probable due to chronic small vessel ischemic changes.   Electronically Signed   By: Lahoma Crocker M.D.   On: 03/17/2014 15:14   Dg Chest Port 1 View  03/17/2014   CLINICAL DATA:  GE junction cancer. Nausea, vomiting and dehydration. Chemotherapy last week. Altered mental status, confusion.  EXAM: PORTABLE CHEST - 1 VIEW  COMPARISON:  03/10/2014  FINDINGS: Right Port-A-Cath remains in place, unchanged. Right Port-A-Cath remains in place, unchanged. Left basilar airspace opacity again noted, stable. No confluent opacity on the right. Mild interstitial prominence throughout the lungs. Cannot completely exclude interstitial edema. Calcified pleural plaques bilaterally.  IMPRESSION: Continued left basilar atelectasis or infiltrate.  Increasing  interstitial prominence. Cannot exclude interstitial edema.   Electronically Signed   By: Rolm Baptise M.D.   On: 03/17/2014 16:11    Scheduled Meds: . amLODipine  10 mg Oral Daily  . aspirin EC  81 mg Oral Daily  . ceFEPime (MAXIPIME) IV  1 g Intravenous Q24H  . cloNIDine  0.3 mg Oral BID  . clopidogrel  75 mg Oral Q breakfast  . feeding supplement (GLUCERNA SHAKE)  237 mL Oral TID BM  . hydrALAZINE  50 mg Oral 4 times per day  . pregabalin  75 mg Oral TID  . senna-docusate  1 tablet Oral BID  . spironolactone  25 mg Oral Daily  . vancomycin  750 mg Intravenous Q12H   Continuous Infusions: . dextrose 5 % and 0.45 % NaCl with KCl 20 mEq/L 100 mL/hr (03/19/14 0441)     Time spent: > 35 minutes    Velvet Bathe  [facility] Pager [phone]. If 7PM-7AM, please contact night-coverage at [email or web address], password [facility] 03/19/2014, 10:05 AM  LOS: 2 days

## 2014-03-20 ENCOUNTER — Telehealth (HOSPITAL_COMMUNITY): Payer: Self-pay | Admitting: Vascular Surgery

## 2014-03-20 ENCOUNTER — Ambulatory Visit: Payer: Medicare Other | Admitting: Nurse Practitioner

## 2014-03-20 ENCOUNTER — Other Ambulatory Visit: Payer: Medicare Other

## 2014-03-20 LAB — CBC WITH DIFFERENTIAL/PLATELET
BASOS ABS: 0 10*3/uL (ref 0.0–0.1)
Basophils Relative: 0 % (ref 0–1)
Eosinophils Absolute: 0 10*3/uL (ref 0.0–0.7)
Eosinophils Relative: 0 % (ref 0–5)
HCT: 20.2 % — ABNORMAL LOW (ref 39.0–52.0)
Hemoglobin: 6.3 g/dL — CL (ref 13.0–17.0)
Lymphocytes Relative: 81 % — ABNORMAL HIGH (ref 12–46)
Lymphs Abs: 0.5 10*3/uL — ABNORMAL LOW (ref 0.7–4.0)
MCH: 28.1 pg (ref 26.0–34.0)
MCHC: 31.2 g/dL (ref 30.0–36.0)
MCV: 90.2 fL (ref 78.0–100.0)
MONOS PCT: 5 % (ref 3–12)
Monocytes Absolute: 0 10*3/uL — ABNORMAL LOW (ref 0.1–1.0)
NEUTROS PCT: 14 % — AB (ref 43–77)
Neutro Abs: 0.1 10*3/uL — ABNORMAL LOW (ref 1.7–7.7)
PLATELETS: 42 10*3/uL — AB (ref 150–400)
RBC: 2.24 MIL/uL — ABNORMAL LOW (ref 4.22–5.81)
RDW: 14.7 % (ref 11.5–15.5)
WBC: 0.6 10*3/uL — CL (ref 4.0–10.5)

## 2014-03-20 LAB — GLUCOSE, CAPILLARY
Glucose-Capillary: 226 mg/dL — ABNORMAL HIGH (ref 70–99)
Glucose-Capillary: 194 mg/dL — ABNORMAL HIGH (ref 70–99)
Glucose-Capillary: 276 mg/dL — ABNORMAL HIGH (ref 70–99)
Glucose-Capillary: 156 mg/dL — ABNORMAL HIGH (ref 70–99)
Glucose-Capillary: 202 mg/dL — ABNORMAL HIGH (ref 70–99)

## 2014-03-20 LAB — ABO/RH: ABO/RH(D): A POS

## 2014-03-20 LAB — PREPARE RBC (CROSSMATCH)

## 2014-03-20 MED ORDER — SODIUM CHLORIDE 0.9 % IV SOLN: Freq: Once | INTRAVENOUS | Status: DC

## 2014-03-20 MED ORDER — SPIRONOLACTONE 25 MG PO TABS
25.0000 mg | ORAL_TABLET | Freq: Two times a day (BID) | ORAL | Status: DC
  Administered 2014-03-20 – 2014-03-25 (×10): 25 mg via ORAL
  Filled 2014-03-20 (×14): qty 1

## 2014-03-20 MED ORDER — HYDROCODONE-ACETAMINOPHEN 5-325 MG PO TABS
1.0000 | ORAL_TABLET | Freq: Four times a day (QID) | ORAL | Status: DC | PRN
  Administered 2014-03-22 – 2014-03-24 (×3): 2 via ORAL
  Filled 2014-03-20 (×3): qty 2

## 2014-03-20 NOTE — Plan of Care (Signed)
Problem: Phase I Progression Outcomes Goal: OOB as tolerated unless otherwise ordered Outcome: Completed/Met Date Met:  03/20/14 Goal: Initial discharge plan identified Outcome: Completed/Met Date Met:  03/20/14 Goal: Hemodynamically stable Outcome: Completed/Met Date Met:  03/20/14

## 2014-03-20 NOTE — Progress Notes (Signed)
[facility] PROGRESS NOTE  [name] UXN:[id number] DOB: 1929-11-14 DOA: 03/17/2014 PCP: Sheela Stack, MD  Assessment/Plan:  Principal Problem:   Acute encephalopathy - Resolved,  most likely multifactorial in the context of uncontrolled HTN and dehydration. Also possible infection although no source identified.  SIRs - In setting of neutropenia and confusion after chemotherapy - Will continue broad coverage with Vancomycin and Cefepime at this moment. - continue to monitor cbc levels.  Anemia - worsened value today requiring transfusion. Most likely 2ary effect to chemotherapy. - will transfuse 1 unit of prbc. - Hematologist assisting.  Active Problems:    Esophageal cancer - Pt to f/u with oncologist on discharge for further evaluation and recommendations. - Oncology on board. Would like to thank Dr. Benay Spice for his assistance with this case.  Chemotherapy associated pancytopenia - Oncology on board and assisting - will continue to monitor cbc and transfuse as necessary - Agree on holding aspirin and plavix given drop in platelet count    Dehydration Resolved and patient tolerating soft diet.    Hypertensive urgency, malignant: still not well controlled with fluctuating values - Cardene d/c'd - Hydralazine PRN elevated BP - Increased oral hydralazine regimen - Increase spironolactone to 25 mg po bid.    Acute renal failure on chronic stage III - Most likely prerenal etiology given h/o poor oral intake and elevated bun/creatinine ratio - improved with IVF rehydration and improved oral intake.  PAD - stable, will continue aspirin and plavix  Code Status: full Family Communication: No family at bedside Disposition Plan: transfer to floor.   Consultants:  Hematologist.  Procedures:  None  Antibiotics:  Vancomycin  Cefepime  HPI/Subjective: No new complaints currently  Objective: Filed Vitals:   03/20/14 0700  BP: 177/42  Pulse:  72  Temp:   Resp: 16    Intake/Output Summary (Last 24 hours) at 03/20/14 0842 Last data filed at 03/20/14 0600  Gross per 24 hour  Intake    830 ml  Output    850 ml  Net    -20 ml   Filed Weights   03/17/14 2327 03/19/14 0046  Weight: 70.1 kg (154 lb 8.7 oz) 72.3 kg (159 lb 6.3 oz)    Exam:   General:  Pt in nad, alert and awake.   Cardiovascular: rrr, no mrg  Respiratory: cta bl, no wheezes  Abdomen: soft, ND, NT  Musculoskeletal: no clubbing   Data Reviewed: Basic Metabolic Panel:  Recent Labs Lab 03/17/14 1457 03/17/14 1500 03/18/14 0332 03/19/14 0344  NA 148* 149* 150* 146  K 3.8 3.7 3.4* 3.8  CL 109  --  114* 113*  CO2 21 23 21 23   GLUCOSE 227* 224* 264* 317*  BUN 42* 41.1* 38* 39*  CREATININE 1.45* 1.5* 1.18 1.17  CALCIUM 9.1 9.0 8.4 7.8*   Liver Function Tests:  Recent Labs Lab 03/17/14 1457 03/17/14 1500  AST 26 16  ALT 11 12  ALKPHOS 134* 136  BILITOT 1.1 1.12  PROT 6.3 6.0*  ALBUMIN 3.5 3.4*   No results for input(s): LIPASE, AMYLASE in the last 168 hours. No results for input(s): AMMONIA in the last 168 hours. CBC:  Recent Labs Lab 03/17/14 1457 03/17/14 1500 03/18/14 0332 03/19/14 0500 03/20/14 0350  WBC 2.6* 3.3* 1.0* 0.5* 0.6*  NEUTROABS 2.4 2.9 0.7* 0.3* 0.1*  HGB 7.9* 7.9* 7.5* 7.7* 6.3*  HCT 24.1* 25.6* 23.3* 24.3* 20.2*  MCV 86.7 87.1 87.3 89.7 90.2  PLT 111* 111* 72* 61* 42*  Cardiac Enzymes:  Recent Labs Lab 03/17/14 1510 03/18/14 0332 03/18/14 1044 03/18/14 1637  TROPONINI <0.30 <0.30 <0.30 <0.30   BNP (last 3 results)  Recent Labs  08/15/13 0939  PROBNP 223.8   CBG:  Recent Labs Lab 03/17/14 1453 03/19/14 1231 03/19/14 1742 03/19/14 2152  GLUCAP 218* 268* 168* 226*    Recent Results (from the past 240 hour(s))  MRSA PCR Screening     Status: None   Collection Time: 03/17/14 11:32 PM  Result Value Ref Range Status   MRSA by PCR NEGATIVE NEGATIVE Final    Comment:        The  GeneXpert MRSA Assay (FDA approved for NASAL specimens only), is one component of a comprehensive MRSA colonization surveillance program. It is not intended to diagnose MRSA infection nor to guide or monitor treatment for MRSA infections.      Studies: No results found.  Scheduled Meds: . sodium chloride   Intravenous Once  . sodium chloride   Intravenous Once  . amLODipine  10 mg Oral Daily  . ceFEPime (MAXIPIME) IV  1 g Intravenous Q24H  . cloNIDine  0.3 mg Oral BID  . feeding supplement (GLUCERNA SHAKE)  237 mL Oral TID BM  . hydrALAZINE  50 mg Oral 4 times per day  . insulin aspart  0-15 Units Subcutaneous TID WC  . pregabalin  75 mg Oral TID  . senna-docusate  1 tablet Oral BID  . spironolactone  25 mg Oral BID  . vancomycin  750 mg Intravenous Q12H   Continuous Infusions: . dextrose 5 % and 0.45 % NaCl with KCl 20 mEq/L 100 mL/hr (03/19/14 0441)     Time spent: > 35 minutes    Velvet Bathe  [facility] Pager [phone]. If 7PM-7AM, please contact night-coverage at [email or web address], password [facility] 03/20/2014, 8:42 AM  LOS: 3 days

## 2014-03-20 NOTE — Progress Notes (Signed)
Inpatient Diabetes Program Recommendations  AACE/ADA: New Consensus Statement on Inpatient Glycemic Control (2013)  Target Ranges:  Prepandial:   less than 140 mg/dL      Peak postprandial:   less than 180 mg/dL (1-2 hours)      Critically ill patients:  140 - 180 mg/dL  Results for [name], [name] (MRN [id number]) as of 03/20/2014 09:58  Ref. Range 03/19/2014 12:31 03/19/2014 17:42 03/19/2014 21:52 03/20/2014 08:25  Glucose-Capillary Latest Range: 70-99 mg/dL 268 (H) 168 (H) 226 (H) 194 (H)   Consider adding basal Lantus 10 units and add Novolog HS scale for CBGs >200. Thank you  Raoul Pitch BSN, RN,CDE Inpatient Diabetes Coordinator [phone] (team pager)

## 2014-03-20 NOTE — Telephone Encounter (Signed)
Encounter opened in error

## 2014-03-20 NOTE — Progress Notes (Signed)
IP PROGRESS NOTE  Subjective:   He is alert. No complaint. No dysphagia.  Objective: Vital signs in last 24 hours: Blood pressure 177/42, pulse 72, temperature 97.9 F (36.6 C), temperature source Oral, resp. rate 16, height $RemoveBe'5\' 11"'KnWksssyQ$  (1.803 m), weight 159 lb 6.3 oz (72.3 kg), SpO2 99 %.  Intake/Output from previous day: 11/12 0701 - 11/13 0700 In: 830 [P.O.:480; IV Piggyback:350] Out: 850 [Urine:850]  Physical Exam:  HEENT: mild whitecoat over the tongue, no buccal thrush, no ulcers Lungs: scattered end inspiratory bronchial sounds, rhonchi at the left posterior base, no respiratory distress Cardiac: regular rate and rhythm Abdomen: soft and nontender Extremities: no leg edema, trace pitting edema at the lower arm and hand bilaterally Skin: Erythematous rash in the left groin appears improved  Portacath/PICC-without erythema  Lab Results:  Recent Labs  03/19/14 0500 03/20/14 0350  WBC 0.5* 0.6*  HGB 7.7* 6.3*  HCT 24.3* 20.2*  PLT 61* 42*  ANC 0.1  BMET  Recent Labs  03/18/14 0332 03/19/14 0344  NA 150* 146  K 3.4* 3.8  CL 114* 113*  CO2 21 23  GLUCOSE 264* 317*  BUN 38* 39*  CREATININE 1.18 1.17  CALCIUM 8.4 7.8*    Studies/Results: No results found.  Medications: I have reviewed the patient's current medications.  Assessment/Plan:  1. GE junction adenocarcinoma  EGD 01/31/2014 with an ulcerated circumferential 2-3 cm mass at the GE junction all on the esophageal side causing incomplete stricture.   Biopsy showed invasive poorly differentiated adenocarcinoma; no amplification of HER-2.   CT abdomen/pelvis 01/30/2014 showed new hypodensities in the liver concerning for metastatic disease.  PET scan 03/04/2014 confirmed a hypermetabolic GE junction mass, numerous hypermetabolic liver metastases, and upper abdominal/retroperitoneal nodal metastases  Cycle 1 5-FU/leucovorin/etoposide11/08/2013 with Neulasta support 2. Dysphagia secondary to  #1 3. Diabetes mellitus 4. Chronic kidney disease 5. Peripheral neuropathy related to diabetes 6. Peripheral arterial disease 7. Hypertension-Markedly elevated blood pressure on hospital admission 8. Hyperlipidemia 9. Admission 03/17/2014 with an altered mental status-improved 10. Pancytopenia-baseline anemia/mild pancytopenia, complicated by chemotherapy.now with severe neutropenia.  He is alert and oriented today. His mental status appears much improved from hospital admission. He has persistent severe neutropenia. The platelets are lower today. Hopefully his counts will improve over the next several days.  Recommendations:  1. Continue broad-spectrum IV antibiotics 2. Daily CBC with differential 3. Hold aspirin and Plavix until the platelet count recovers 4. Obtain blood cultures and adjust antibiotics for a fever spike 5. Transfuse packed red blood cells for symptomatic anemia 6. Increase ambulation as tolerated 7. Oncology will check on him 03/21/2014, please call oncology as needed     LOS: 3 days   [place]  03/20/2014, 7:55 AM

## 2014-03-21 LAB — CBC WITH DIFFERENTIAL/PLATELET
Basophils Absolute: 0 10*3/uL (ref 0.0–0.1)
Basophils Relative: 2 % — ABNORMAL HIGH (ref 0–1)
Eosinophils Absolute: 0 10*3/uL (ref 0.0–0.7)
Eosinophils Relative: 2 % (ref 0–5)
HCT: 24.4 % — ABNORMAL LOW (ref 39.0–52.0)
HEMOGLOBIN: 8.1 g/dL — AB (ref 13.0–17.0)
LYMPHS ABS: 0.5 10*3/uL — AB (ref 0.7–4.0)
Lymphocytes Relative: 82 % — ABNORMAL HIGH (ref 12–46)
MCH: 28.6 pg (ref 26.0–34.0)
MCHC: 33.2 g/dL (ref 30.0–36.0)
MCV: 86.2 fL (ref 78.0–100.0)
MONO ABS: 0 10*3/uL — AB (ref 0.1–1.0)
Monocytes Relative: 6 % (ref 3–12)
Neutro Abs: 0 10*3/uL — ABNORMAL LOW (ref 1.7–7.7)
Neutrophils Relative %: 8 % — ABNORMAL LOW (ref 43–77)
Platelets: 34 10*3/uL — ABNORMAL LOW (ref 150–400)
RBC: 2.83 MIL/uL — ABNORMAL LOW (ref 4.22–5.81)
RDW: 14.2 % (ref 11.5–15.5)
WBC: 0.5 10*3/uL — CL (ref 4.0–10.5)

## 2014-03-21 LAB — TYPE AND SCREEN
ABO/RH(D): A POS
Antibody Screen: NEGATIVE
UNIT DIVISION: 0

## 2014-03-21 LAB — GLUCOSE, CAPILLARY
GLUCOSE-CAPILLARY: 242 mg/dL — AB (ref 70–99)
GLUCOSE-CAPILLARY: 217 mg/dL — AB (ref 70–99)
Glucose-Capillary: 304 mg/dL — ABNORMAL HIGH (ref 70–99)
Glucose-Capillary: 153 mg/dL — ABNORMAL HIGH (ref 70–99)

## 2014-03-21 LAB — BASIC METABOLIC PANEL
Anion gap: 9 (ref 5–15)
BUN: 36 mg/dL — AB (ref 6–23)
CO2: 22 mEq/L (ref 19–32)
Calcium: 7.7 mg/dL — ABNORMAL LOW (ref 8.4–10.5)
Chloride: 109 mEq/L (ref 96–112)
Creatinine, Ser: 1.03 mg/dL (ref 0.50–1.35)
GFR calc Af Amer: 76 mL/min — ABNORMAL LOW (ref >90)
GFR, EST NON AFRICAN AMERICAN: 66 mL/min — AB (ref >90)
Glucose, Bld: 255 mg/dL — ABNORMAL HIGH (ref 70–99)
Potassium: 4.1 mEq/L (ref 3.7–5.3)
Sodium: 140 mEq/L (ref 137–147)

## 2014-03-21 LAB — CBC
HCT: 26 % — ABNORMAL LOW (ref 39.0–52.0)
Hemoglobin: 8.4 g/dL — ABNORMAL LOW (ref 13.0–17.0)
MCH: 28 pg (ref 26.0–34.0)
MCHC: 32.3 g/dL (ref 30.0–36.0)
MCV: 86.7 fL (ref 78.0–100.0)
Platelets: 36 10*3/uL — ABNORMAL LOW (ref 150–400)
RBC: 3 MIL/uL — ABNORMAL LOW (ref 4.22–5.81)
RDW: 14.4 % (ref 11.5–15.5)
WBC: 0.8 10*3/uL — CL (ref 4.0–10.5)

## 2014-03-21 LAB — VANCOMYCIN, TROUGH: VANCOMYCIN TR: 19.2 ug/mL (ref 10.0–20.0)

## 2014-03-21 MED ORDER — VANCOMYCIN HCL IN DEXTROSE 750-5 MG/150ML-% IV SOLN
750.0000 mg | Freq: Two times a day (BID) | INTRAVENOUS | Status: DC
  Administered 2014-03-22 – 2014-03-24 (×4): 750 mg via INTRAVENOUS
  Filled 2014-03-21 (×5): qty 150

## 2014-03-21 MED ORDER — DEXTROSE 5 % IV SOLN
1.0000 g | Freq: Three times a day (TID) | INTRAVENOUS | Status: DC
  Administered 2014-03-21 – 2014-03-24 (×9): 1 g via INTRAVENOUS
  Filled 2014-03-21 (×10): qty 1

## 2014-03-21 NOTE — Progress Notes (Signed)
PHARMACY BRIEF NOTE:  VANCOMYCIN DOSING  Now on D#5 vancomycin and D#5 cefepime for SIRS and r/o pneumonia in setting of chemotherapy-associated pancytopenia including profound neutropenia (ANC=0).  Vancomycin trough 19.2 on 750 mg IV q12h Afebrile.  Hemodynamically stable.  Resp rate elevated (24) on afternoon vitals.  No culture data. MRSA PCR nasal swab screening negative. SCr 1.03 (improved).   Estimated CrCl 57 mL/min Tonight's vancomycin dose already charted as given.  Assessment: Vancomycin trough therapeutic but at upper end of target range.   Am concerned regarding potential for further accumulation of drug.  Plan: 1. Delay next vancomycin dose until 2pm, then continue 750 mg IV q12h.. 2. Agree current cefepime dosage of 1 gram IV q8h is acceptable in setting of profound neutropenia since CrCl < 60 mL/min 3. Follow serum creatinine daily. 4. Recheck vancomycin trough in 2 to 3 days if therapy continues.  Clayburn Pert, PharmD, BCPS Pager: [phone] 03/21/2014  9:49 PM

## 2014-03-21 NOTE — Progress Notes (Signed)
ANTIBIOTIC CONSULT NOTE - Follow up  Pharmacy Consult for Vancomycin / Cefepime Indication: HCAP / sepsis  Allergies  Allergen Reactions  . Contrast Media [Iodinated Diagnostic Agents] Other (See Comments)    Reaction:  History of renal failure following contrast requiring temporary dialysis.    . Metformin Hcl Other (See Comments)    Reaction:  Renal failure----Pt states that "metformin will kill me".  . Aliskiren Rash  . Doxazosin Mesylate Rash    Patient Measurements: Height: 5\' 11"  (180.3 cm) Weight: 159 lb 6.3 oz (72.3 kg) IBW/kg (Calculated) : 75.3   Vital Signs: Temp: 98.6 F (37 C) (11/14 0535) Temp Source: Oral (11/14 0535) BP: 150/55 mmHg (11/14 0535) Pulse Rate: 70 (11/14 0535) Intake/Output from previous day: 11/13 0701 - 11/14 0700 In: 2757 [P.O.:720; I.V.:1362; Blood:325; IV Piggyback:350] Out: 0630 [ZSWFU:9323] Intake/Output from this shift:    Labs:  Recent Labs  03/19/14 0344  03/20/14 0350 03/21/14 0030 03/21/14 0545  WBC  --   < > 0.6* 0.8* 0.5*  HGB  --   < > 6.3* 8.4* 8.1*  PLT  --   < > 42* 36* 34*  CREATININE 1.17  --   --   --  1.03  < > = values in this interval not displayed. Estimated Creatinine Clearance: 56.5 mL/min (by C-G formula based on Cr of 1.03). No results for input(s): VANCOTROUGH, VANCOPEAK, VANCORANDOM, GENTTROUGH, GENTPEAK, GENTRANDOM, TOBRATROUGH, TOBRAPEAK, TOBRARND, AMIKACINPEAK, AMIKACINTROU, AMIKACIN in the last 72 hours.   Microbiology: Recent Results (from the past 720 hour(s))  MRSA PCR Screening     Status: None   Collection Time: 03/17/14 11:32 PM  Result Value Ref Range Status   MRSA by PCR NEGATIVE NEGATIVE Final    Comment:        The GeneXpert MRSA Assay (FDA approved for NASAL specimens only), is one component of a comprehensive MRSA colonization surveillance program. It is not intended to diagnose MRSA infection nor to guide or monitor treatment for MRSA infections.    Assessment: 80yo M w/  AMS and poor intake. Currently undergoing chemotherapy for GE junction cancer, last given on 11/6. Pharmacy is asked to dose Vancomycin/Cefepime for possible HCAP(aspiration?)   11/10 >> Cefepime >> 11/10 >> Vanc >>   Tmax: AF WBCs: 0.5. ANC 0. (Neulasta 11/7) Renal: CKD. SCr improved to 1.03, 57CG/N  No cultures  Goal of Therapy:  Vancomycin trough level 15-20 mcg/ml  Appropriate antibiotic dosing for renal function; eradication of infection  Plan:   Cont Vanc 750mg  IV q12h.   Check Vanc trough tonight.  Change Cefepime to 1g IV Q8H.  Follow up renal fxn and culture results.  Neutropenic. Neulasta was given on 11/7. Since 7 days have passed, should daily Neupogen be started?  Romeo Rabon, PharmD, pager [phone]. 03/21/2014,9:19 AM.

## 2014-03-21 NOTE — Progress Notes (Signed)
[facility] PROGRESS NOTE  [name] JJO:[id number] DOB: [birth year] DOA: 03/17/2014 PCP: Sheela Stack, MD  Assessment/Plan:  Principal Problem:   Acute encephalopathy - Resolved,  most likely multifactorial in the context of uncontrolled HTN and dehydration. Also possible infection although no source identified.  SIRs - In setting of neutropenia and confusion after chemotherapy - Will continue broad coverage with Vancomycin and Cefepime at this moment. - continue to monitor cbc levels.  Anemia - worsened value today requiring transfusion. Most likely 2ary effect to chemotherapy. - after 1 unit of blood hgb steady at 8 - Hematologist assisting.  Active Problems:    Esophageal cancer - Pt to f/u with oncologist on discharge for further evaluation and recommendations. - Oncology on board. Would like to thank Dr. Benay Spice for his assistance with this case.  Chemotherapy associated pancytopenia - Oncology on board and assisting - will continue to monitor cbc and transfuse as necessary - Agree on holding aspirin and plavix given drop in platelet count    Dehydration Resolved and patient tolerating soft diet.    Hypertensive urgency, malignant: still not well controlled with fluctuating values - Cardene d/c'd - Hydralazine PRN elevated BP - Increased oral hydralazine regimen - Increase spironolactone to 25 mg po bid. Improved blood pressure control on this regimen.    Acute renal failure on chronic stage III - Most likely prerenal etiology given h/o poor oral intake and elevated bun/creatinine ratio - improved with IVF rehydration and improved oral intake.  PAD - stable, will continue aspirin and plavix  Code Status: full Family Communication: No family at bedside Disposition Plan: Med surg   Consultants:  Hematologist.  Procedures:  None  Antibiotics:  Vancomycin  Cefepime  HPI/Subjective: Pt has no new complaints. No acute issues reported  overnight.  Objective: Filed Vitals:   03/21/14 0535  BP: 150/55  Pulse: 70  Temp: 98.6 F (37 C)  Resp: 16    Intake/Output Summary (Last 24 hours) at 03/21/14 1125 Last data filed at 03/21/14 0940  Gross per 24 hour  Intake   2607 ml  Output   1575 ml  Net   1032 ml   Filed Weights   03/17/14 2327 03/19/14 0046  Weight: 70.1 kg (154 lb 8.7 oz) 72.3 kg (159 lb 6.3 oz)    Exam:   General:  Pt in nad, alert and awake.   Cardiovascular: rrr, no mrg  Respiratory: cta bl, no wheezes  Abdomen: soft, ND, NT  Musculoskeletal: no clubbing   Data Reviewed: Basic Metabolic Panel:  Recent Labs Lab 03/17/14 1457 03/17/14 1500 03/18/14 0332 03/19/14 0344 03/21/14 0545  NA 148* 149* 150* 146 140  K 3.8 3.7 3.4* 3.8 4.1  CL 109  --  114* 113* 109  CO2 21 23 21 23 22   GLUCOSE 227* 224* 264* 317* 255*  BUN 42* 41.1* 38* 39* 36*  CREATININE 1.45* 1.5* 1.18 1.17 1.03  CALCIUM 9.1 9.0 8.4 7.8* 7.7*   Liver Function Tests:  Recent Labs Lab 03/17/14 1457 03/17/14 1500  AST 26 16  ALT 11 12  ALKPHOS 134* 136  BILITOT 1.1 1.12  PROT 6.3 6.0*  ALBUMIN 3.5 3.4*   No results for input(s): LIPASE, AMYLASE in the last 168 hours. No results for input(s): AMMONIA in the last 168 hours. CBC:  Recent Labs Lab 03/17/14 1500 03/18/14 0332 03/19/14 0500 03/20/14 0350 03/21/14 0030 03/21/14 0545  WBC 3.3* 1.0* 0.5* 0.6* 0.8* 0.5*  NEUTROABS 2.9 0.7* 0.3* 0.1*  --  0.0*  HGB 7.9* 7.5* 7.7* 6.3* 8.4* 8.1*  HCT 25.6* 23.3* 24.3* 20.2* 26.0* 24.4*  MCV 87.1 87.3 89.7 90.2 86.7 86.2  PLT 111* 72* 61* 42* 36* 34*   Cardiac Enzymes:  Recent Labs Lab 03/17/14 1510 03/18/14 0332 03/18/14 1044 03/18/14 1637  TROPONINI <0.30 <0.30 <0.30 <0.30   BNP (last 3 results)  Recent Labs  08/15/13 0939  PROBNP 223.8   CBG:  Recent Labs Lab 03/20/14 0825 03/20/14 1216 03/20/14 1728 03/20/14 2137 03/21/14 0734  GLUCAP 194* 276* 156* 202* 242*    Recent Results  (from the past 240 hour(s))  MRSA PCR Screening     Status: None   Collection Time: 03/17/14 11:32 PM  Result Value Ref Range Status   MRSA by PCR NEGATIVE NEGATIVE Final    Comment:        The GeneXpert MRSA Assay (FDA approved for NASAL specimens only), is one component of a comprehensive MRSA colonization surveillance program. It is not intended to diagnose MRSA infection nor to guide or monitor treatment for MRSA infections.      Studies: No results found.  Scheduled Meds: . sodium chloride   Intravenous Once  . sodium chloride   Intravenous Once  . amLODipine  10 mg Oral Daily  . ceFEPime (MAXIPIME) IV  1 g Intravenous 3 times per day  . cloNIDine  0.3 mg Oral BID  . feeding supplement (GLUCERNA SHAKE)  237 mL Oral TID BM  . hydrALAZINE  50 mg Oral 4 times per day  . insulin aspart  0-15 Units Subcutaneous TID WC  . pregabalin  75 mg Oral TID  . senna-docusate  1 tablet Oral BID  . spironolactone  25 mg Oral BID  . vancomycin  750 mg Intravenous Q12H   Continuous Infusions: . dextrose 5 % and 0.45 % NaCl with KCl 20 mEq/L 100 mL/hr at 03/21/14 0400     Time spent: > 35 minutes    Velvet Bathe  [facility] Pager [phone]. If 7PM-7AM, please contact night-coverage at [email or web address], password [facility] 03/21/2014, 11:25 AM  LOS: 4 days

## 2014-03-21 NOTE — Plan of Care (Signed)
Problem: Phase I Progression Outcomes Goal: Pain controlled with appropriate interventions Outcome: Completed/Met Date Met:  February 28, 2014

## 2014-03-21 NOTE — Plan of Care (Signed)
Problem: Phase I Progression Outcomes Goal: Other Phase I Outcomes/Goals Outcome: Progressing     

## 2014-03-22 ENCOUNTER — Inpatient Hospital Stay (HOSPITAL_COMMUNITY): Payer: Medicare Other

## 2014-03-22 LAB — CBC WITH DIFFERENTIAL/PLATELET
Basophils Absolute: 0 10*3/uL (ref 0.0–0.1)
Basophils Relative: 0 % (ref 0–1)
EOS PCT: 0 % (ref 0–5)
Eosinophils Absolute: 0 10*3/uL (ref 0.0–0.7)
HCT: 24.2 % — ABNORMAL LOW (ref 39.0–52.0)
Hemoglobin: 8.2 g/dL — ABNORMAL LOW (ref 13.0–17.0)
LYMPHS PCT: 72 % — AB (ref 12–46)
Lymphs Abs: 0.6 10*3/uL — ABNORMAL LOW (ref 0.7–4.0)
MCH: 28.8 pg (ref 26.0–34.0)
MCHC: 33.9 g/dL (ref 30.0–36.0)
MCV: 84.9 fL (ref 78.0–100.0)
MONOS PCT: 14 % — AB (ref 3–12)
Monocytes Absolute: 0.1 10*3/uL (ref 0.1–1.0)
Neutro Abs: 0.1 10*3/uL — ABNORMAL LOW (ref 1.7–7.7)
Neutrophils Relative %: 14 % — ABNORMAL LOW (ref 43–77)
PLATELETS: 34 10*3/uL — AB (ref 150–400)
RBC: 2.85 MIL/uL — AB (ref 4.22–5.81)
RDW: 14 % (ref 11.5–15.5)
WBC: 0.8 10*3/uL — AB (ref 4.0–10.5)

## 2014-03-22 LAB — GLUCOSE, CAPILLARY
GLUCOSE-CAPILLARY: 242 mg/dL — AB (ref 70–99)
GLUCOSE-CAPILLARY: 163 mg/dL — AB (ref 70–99)
Glucose-Capillary: 217 mg/dL — ABNORMAL HIGH (ref 70–99)
Glucose-Capillary: 145 mg/dL — ABNORMAL HIGH (ref 70–99)

## 2014-03-22 LAB — CREATININE, SERUM
CREATININE: 0.98 mg/dL (ref 0.50–1.35)
GFR calc Af Amer: 86 mL/min — ABNORMAL LOW (ref >90)
GFR calc non Af Amer: 74 mL/min — ABNORMAL LOW (ref >90)

## 2014-03-22 NOTE — Progress Notes (Signed)
[facility] PROGRESS NOTE  [name] BLT:[id number] DOB: September 03, 1929 DOA: 03/17/2014 PCP: Sheela Stack, MD  Assessment/Plan:  Principal Problem:   Acute encephalopathy - Resolved,  most likely multifactorial in the context of uncontrolled HTN and dehydration. Also possible infection although no source identified.  SIRs - In setting of neutropenia and confusion after chemotherapy - Will continue broad coverage with Vancomycin and Cefepime at this moment. No source of infection at the moment. Will repeat chest x ray - continue to monitor cbc levels.  Anemia - worsened value today requiring transfusion. Most likely 2ary effect to chemotherapy. - after 1 unit of blood hgb steady at 8 - Hematologist assisting.  Active Problems:    Esophageal cancer - Pt to f/u with oncologist on discharge for further evaluation and recommendations. - Oncology on board. Would like to thank Dr. Benay Spice for his assistance with this case.  Chemotherapy associated pancytopenia - Oncology on board and assisting - will continue to monitor cbc and transfuse as necessary - Agree on holding aspirin and plavix given drop in platelet count    Dehydration Resolved and patient tolerating soft diet. - nursing reports concern for aspiration. Will obtain speech therapy evaluation.    Hypertensive urgency, malignant: still not well controlled with fluctuating values - Cardene d/c'd - Hydralazine PRN elevated BP - Increased oral hydralazine regimen - Increase spironolactone to 25 mg po bid. Improved blood pressure control on this regimen.    Acute renal failure on chronic stage III - Most likely prerenal etiology given h/o poor oral intake and elevated bun/creatinine ratio - improved with IVF rehydration and improved oral intake.  PAD - stable, will continue aspirin and plavix  Code Status: full Family Communication: No family at bedside Disposition Plan: Med  surg   Consultants:  Hematologist.  Procedures:  None  Antibiotics:  Vancomycin  Cefepime  HPI/Subjective: Pt has no new complaints. No acute issues reported overnight.  Objective: Filed Vitals:   03/22/14 0542  BP: 170/44  Pulse: 62  Temp: 99.9 F (37.7 C)  Resp: 24    Intake/Output Summary (Last 24 hours) at 03/22/14 1059 Last data filed at 03/22/14 0548  Gross per 24 hour  Intake 3001.67 ml  Output   1900 ml  Net 1101.67 ml   Filed Weights   03/17/14 2327 03/19/14 0046  Weight: 70.1 kg (154 lb 8.7 oz) 72.3 kg (159 lb 6.3 oz)    Exam:   General:  Pt in nad, alert and awake.   Cardiovascular: rrr, no mrg  Respiratory: cta bl, no wheezes  Abdomen: soft, ND, NT  Musculoskeletal: no clubbing   Data Reviewed: Basic Metabolic Panel:  Recent Labs Lab 03/17/14 1457 03/17/14 1500 03/18/14 0332 03/19/14 0344 03/21/14 0545 03/22/14 0515  NA 148* 149* 150* 146 140  --   K 3.8 3.7 3.4* 3.8 4.1  --   CL 109  --  114* 113* 109  --   CO2 21 23 21 23 22   --   GLUCOSE 227* 224* 264* 317* 255*  --   BUN 42* 41.1* 38* 39* 36*  --   CREATININE 1.45* 1.5* 1.18 1.17 1.03 0.98  CALCIUM 9.1 9.0 8.4 7.8* 7.7*  --    Liver Function Tests:  Recent Labs Lab 03/17/14 1457 03/17/14 1500  AST 26 16  ALT 11 12  ALKPHOS 134* 136  BILITOT 1.1 1.12  PROT 6.3 6.0*  ALBUMIN 3.5 3.4*   No results for input(s): LIPASE, AMYLASE in the last 168  hours. No results for input(s): AMMONIA in the last 168 hours. CBC:  Recent Labs Lab 03/18/14 0332 03/19/14 0500 03/20/14 0350 03/21/14 0030 03/21/14 0545 03/22/14 0515  WBC 1.0* 0.5* 0.6* 0.8* 0.5* 0.8*  NEUTROABS 0.7* 0.3* 0.1*  --  0.0* 0.1*  HGB 7.5* 7.7* 6.3* 8.4* 8.1* 8.2*  HCT 23.3* 24.3* 20.2* 26.0* 24.4* 24.2*  MCV 87.3 89.7 90.2 86.7 86.2 84.9  PLT 72* 61* 42* 36* 34* 34*   Cardiac Enzymes:  Recent Labs Lab 03/17/14 1510 03/18/14 0332 03/18/14 1044 03/18/14 1637  TROPONINI <0.30 <0.30 <0.30  <0.30   BNP (last 3 results)  Recent Labs  08/15/13 0939  PROBNP 223.8   CBG:  Recent Labs Lab 03/21/14 0734 03/21/14 1152 03/21/14 1650 03/21/14 2155 03/22/14 0749  GLUCAP 242* 304* 153* 217* 242*    Recent Results (from the past 240 hour(s))  MRSA PCR Screening     Status: None   Collection Time: 03/17/14 11:32 PM  Result Value Ref Range Status   MRSA by PCR NEGATIVE NEGATIVE Final    Comment:        The GeneXpert MRSA Assay (FDA approved for NASAL specimens only), is one component of a comprehensive MRSA colonization surveillance program. It is not intended to diagnose MRSA infection nor to guide or monitor treatment for MRSA infections.      Studies: No results found.  Scheduled Meds: . sodium chloride   Intravenous Once  . sodium chloride   Intravenous Once  . amLODipine  10 mg Oral Daily  . ceFEPime (MAXIPIME) IV  1 g Intravenous 3 times per day  . cloNIDine  0.3 mg Oral BID  . feeding supplement (GLUCERNA SHAKE)  237 mL Oral TID BM  . hydrALAZINE  50 mg Oral 4 times per day  . insulin aspart  0-15 Units Subcutaneous TID WC  . pregabalin  75 mg Oral TID  . senna-docusate  1 tablet Oral BID  . spironolactone  25 mg Oral BID  . vancomycin  750 mg Intravenous Q12H   Continuous Infusions: . dextrose 5 % and 0.45 % NaCl with KCl 20 mEq/L 100 mL/hr at 03/22/14 0043     Time spent: > 35 minutes    Velvet Bathe  [facility] Pager [phone]. If 7PM-7AM, please contact night-coverage at [email or web address], password [facility] 03/22/2014, 10:59 AM  LOS: 5 days

## 2014-03-23 DIAGNOSIS — E119 Type 2 diabetes mellitus without complications: Secondary | ICD-10-CM

## 2014-03-23 LAB — CBC WITH DIFFERENTIAL/PLATELET
Basophils Absolute: 0 10*3/uL (ref 0.0–0.1)
Basophils Relative: 1 % (ref 0–1)
EOS ABS: 0 10*3/uL (ref 0.0–0.7)
Eosinophils Relative: 1 % (ref 0–5)
HCT: 23 % — ABNORMAL LOW (ref 39.0–52.0)
Hemoglobin: 7.7 g/dL — ABNORMAL LOW (ref 13.0–17.0)
LYMPHS PCT: 61 % — AB (ref 12–46)
Lymphs Abs: 0.8 10*3/uL (ref 0.7–4.0)
MCH: 28.7 pg (ref 26.0–34.0)
MCHC: 33.5 g/dL (ref 30.0–36.0)
MCV: 85.8 fL (ref 78.0–100.0)
Monocytes Absolute: 0.2 10*3/uL (ref 0.1–1.0)
Monocytes Relative: 19 % — ABNORMAL HIGH (ref 3–12)
NEUTROS PCT: 18 % — AB (ref 43–77)
Neutro Abs: 0.2 10*3/uL — ABNORMAL LOW (ref 1.7–7.7)
Platelets: 32 10*3/uL — ABNORMAL LOW (ref 150–400)
RBC: 2.68 MIL/uL — ABNORMAL LOW (ref 4.22–5.81)
RDW: 13.9 % (ref 11.5–15.5)
WBC: 1.2 10*3/uL — CL (ref 4.0–10.5)

## 2014-03-23 LAB — GLUCOSE, CAPILLARY
GLUCOSE-CAPILLARY: 227 mg/dL — AB (ref 70–99)
GLUCOSE-CAPILLARY: 158 mg/dL — AB (ref 70–99)
Glucose-Capillary: 234 mg/dL — ABNORMAL HIGH (ref 70–99)
Glucose-Capillary: 158 mg/dL — ABNORMAL HIGH (ref 70–99)

## 2014-03-23 LAB — CREATININE, SERUM
CREATININE: 1.03 mg/dL (ref 0.50–1.35)
GFR calc Af Amer: 76 mL/min — ABNORMAL LOW (ref >90)
GFR, EST NON AFRICAN AMERICAN: 66 mL/min — AB (ref >90)

## 2014-03-23 MED ORDER — ZOLPIDEM TARTRATE 5 MG PO TABS
5.0000 mg | ORAL_TABLET | Freq: Once | ORAL | Status: AC
  Administered 2014-03-23: 5 mg via ORAL
  Filled 2014-03-23: qty 1

## 2014-03-23 NOTE — Progress Notes (Signed)
IP PROGRESS NOTE  Subjective:   He is alert. No complaint. No nausea or dysphagia.  Objective: Vital signs in last 24 hours: Blood pressure 149/99, pulse 62, temperature 97.6 F (36.4 C), temperature source Oral, resp. rate 18, height $RemoveBe'5\' 11"'[id number]$  (1.803 m), weight 159 lb 6.3 oz (72.3 kg), SpO2 97 %.  Intake/Output from previous day: 11/15 0701 - 11/16 0700 In: 3065 [P.O.:360; I.V.:2355; IV Piggyback:350] Out: 1000 [Urine:1000]  Physical Exam:  HEENT: no thrush Lungs: clear bilaterally Cardiac: regular rate and rhythm Abdomen: soft and nontender Extremities: no leg edema, trace pitting edema at the lower arm and hand bilaterally Skin: Erythematous rash in the left groin appears improved  Portacath/PICC-without erythema  Lab Results:  Recent Labs  03/22/14 0515 03/23/14 0558  WBC 0.8* 1.2*  HGB 8.2* 7.7*  HCT 24.2* 23.0*  PLT 34* 32*  ANC 0.2  BMET  Recent Labs  03/21/14 0545 03/22/14 0515 03/23/14 0558  NA 140  --   --   K 4.1  --   --   CL 109  --   --   CO2 22  --   --   GLUCOSE 255*  --   --   BUN 36*  --   --   CREATININE 1.03 0.98 1.03  CALCIUM 7.7*  --   --     Studies/Results: Dg Chest Port 1 View  03/22/2014   CLINICAL DATA:  Cough, congestion, history of esophageal cancer  EXAM: PORTABLE CHEST - 1 VIEW  COMPARISON:  03/17/2014  FINDINGS: Cardiomediastinal silhouette is stable. Right IJ Port-A-Cath is unchanged in position. No pulmonary edema. Persistent left basilar atelectasis or infiltrate and probable small left pleural effusion. Right lung is clear.  IMPRESSION: Persistent left basilar atelectasis or infiltrate and probable small left pleural effusion. Stable right IJ Port-A-Cath position.   Electronically Signed   By: Lahoma Crocker M.D.   On: 03/22/2014 13:40    Medications: I have reviewed the patient's current medications.  Assessment/Plan:  1. GE junction adenocarcinoma  EGD 01/31/2014 with an ulcerated circumferential 2-3 cm mass at the GE  junction all on the esophageal side causing incomplete stricture.   Biopsy showed invasive poorly differentiated adenocarcinoma; no amplification of HER-2.   CT abdomen/pelvis 01/30/2014 showed new hypodensities in the liver concerning for metastatic disease.  PET scan 03/04/2014 confirmed a hypermetabolic GE junction mass, numerous hypermetabolic liver metastases, and upper abdominal/retroperitoneal nodal metastases  Cycle 1 5-FU/leucovorin/etoposide11/08/2013 with Neulasta support 2. Dysphagia secondary to #1 3. Diabetes mellitus 4. Chronic kidney disease 5. Peripheral neuropathy related to diabetes 6. Peripheral arterial disease 7. Hypertension-Markedly elevated blood pressure on hospital admission 8. Hyperlipidemia 9. Admission 03/17/2014 with an altered mental status-improved 10. Pancytopenia-baseline anemia/mild pancytopenia, complicated by chemotherapy.now with severe neutropenia.  He appears well.the platelet count is stable and the neutrophil count is slightly higher today. Hopefully the counts will recover over the next few days.the hemoglobin is up after a red cell transfusion.he should be ready for discharge when the neutrophil count is clearly recovering.  Recommendations:  1. Continue broad-spectrum IV antibiotics 2. Daily CBC with differential 3. Hold aspirin and Plavix until the platelet count recovers 4. Obtain blood cultures and adjust antibiotics for a fever spike 5. Increase ambulation as tolerated 6. Decrease IV fluids     LOS: 6 days   Carena Stream  03/23/2014, 8:37 AM

## 2014-03-23 NOTE — Evaluation (Signed)
Clinical/Bedside Swallow Evaluation Patient Details  Name: [name] MRN: [id number] Date of Birth: Sep 16, 1929  Today's Date: 03/23/2014 Time: 0903-0922 SLP Time Calculation (min) (ACUTE ONLY): 19 min  Past Medical History:  Past Medical History  Diagnosis Date  . Carotid stenosis     u/s 5/11: R 0-39% L 40-59% (stable)  . HTN (hypertension)     refractory. unable to cardura in past due to hypotension  . Edema   . PAD (peripheral artery disease)   . Cerebrovascular disease, unspecified   . HLD (hyperlipidemia)   . Bradycardia   . Diabetes mellitus type II   . Hyperkalemia   . Shortness of breath   . Arthritis     " IN MY HANDS "  . Chronic kidney disease, stage III (moderate)   . Stroke   . Cancer     esophageal.    Past Surgical History:  Past Surgical History  Procedure Laterality Date  . Carotid endarterectomy  1/01    right  . Balloon angioplasty, artery Bilateral 04/09/2013    LEFT ILIAC & RT COMMON ILIAC     DR [name]    . Femoral artery stent  ?   2010    DR COOPER  . Esophagogastroduodenoscopy N/A 01/31/2014    Procedure: ESOPHAGOGASTRODUODENOSCOPY (EGD);  Surgeon: [name] Banister, MD;  Location: [facility];  Service: Endoscopy;  Laterality: N/A;   HPI:  80 yo male adm to [facility] with acute encephalopathy, SIRS, neutropenia, confusion after chemo.  Pt recently diagnosed with esophageal cancer and has undergone first chemo treatments per his statement.  Pt found to be dehydrated and hypertensive upon admit.  CXR showed probable small left effusion, persistent left infiltrate.  Swallow evaluation ordered as RN reported concerns for dysphagia to Md.     Assessment / Plan / Recommendation Clinical Impression  Pt admits to h/o dysphagia symptoms resulting in esopahgeal cancer diagnosis.  He states symptoms included sensing lodging at lower esophagus and that he consumes warm or carbonated drinks to aid clearance.  Pt denies coughing during intake but admits to  coughing when lying down.  He states he does not lay down after eating and uses restroom (ambulates).   Poor appetite also reported.    No s/s of aspiration nor focal CN deficits noted during SLP evaluation.  Swallow was timely with clear voice throughout and no indication of stasis.    Pt does admit to xerostomia for which SlP provided written/verbal compensation strategies.  Pt appears to be managing his primary esophageal dysphagia as best able - mitigation education completed  and SLP to sign off.      Aspiration Risk  Mild    Diet Recommendation Regular;Thin liquid   Liquid Administration via: Cup;Straw Medication Administration: Whole meds with liquid (start with liquids) Supervision: Patient able to self feed Compensations: Slow rate;Small sips/bites (start with liquids, consume liquids throughout meal) Postural Changes and/or Swallow Maneuvers: Seated upright 90 degrees;Upright 30-60 min after meal    Other  Recommendations Oral Care Recommendations: Oral care BID   Follow Up Recommendations  None    Frequency and Duration   n/a     Pertinent Vitals/Pain Afebrile, decreased     Swallow Study Prior Functional Status   see [place] Date of Onset: 03/23/14 HPI: 80 yo male adm to [facility] with acute encephalopathy, SIRS, neutropenia, confusion after chemo.  Pt recently diagnosed with esophageal cancer and has undergone first chemo treatments per his statement.  Pt  found to be dehydrated and hypertensive upon admit.  CXR showed probable small left effusion, persistent left infiltrate.  Swallow evaluation ordered as RN reported concerns for dysphagia to Md.   Type of Study: Bedside swallow evaluation Previous Swallow Assessment: none Diet Prior to this Study: Thin liquids ("soft") Temperature Spikes Noted: No Respiratory Status: Room air History of Recent Intubation: No Behavior/Cognition: Alert;Cooperative;Pleasant mood Oral Cavity - Dentition: Adequate natural dentition  (upper partial) Self-Feeding Abilities: Able to feed self Patient Positioning: Upright in bed Baseline Vocal Quality: Clear Volitional Cough: Strong Volitional Swallow: Able to elicit    Oral/Motor/Sensory Function Overall Oral Motor/Sensory Function: Appears within functional limits for tasks assessed   Ice Chips Ice chips: Not tested   Thin Liquid Thin Liquid: Within functional limits Presentation: Straw;Self Fed    Nectar Thick Nectar Thick Liquid: Not tested   Honey Thick Honey Thick Liquid: Not tested   Puree Puree: Not tested   Solid   GO    Solid: Within functional limits Presentation: [name], [name], [name] [facility] SLP [phone]

## 2014-03-23 NOTE — Progress Notes (Signed)
NUTRITION FOLLOW UP  INTERVENTION: -Continue Glucerna Shake po TID, each supplement provides 220 kcal and 10 grams of protein -Soft diet -Encouraged intake  -RD to continue to monitor  NUTRITION DIAGNOSIS: Inadequate oral intake related to decreased appetite as evidenced by PO intake < 75%, 14 lb weight loss.-continues   Goal: Pt to meet >/= 90% of their estimated nutrition needs -not yet met.   Monitor:  Diet order, total protein/energy intake, labs, weight, swallow profile  Reason for Assessment: MST  80 y.o. male  Admitting Dx: Acute encephalopathy  ASSESSMENT: [name] is a 80 y.o. male who is sent in from his oncologists office today for AMS. He has been confused progressively for the past few days since having 3 days of chemotherapy last week.  -Pt reported ongoing decreased appetite. Diet recall indicates pt consuming two Glucerna supplement daily with minimal intake of solid foods. Was seen by oncology outpatient RD on 03/09/2014; recommended increasing intake of supplement to TID and incorporating high protein/kcal blended foods and snacks -Experiencing ongoing weight loss, usual body weight is around 188 lbs in 11/2013, and weighed 174 lbs in 02/2014, indicating a 15 lb weight loss in 3 month (8% body weight loss, severe for time frame) Current weight likely affected by dehydration status  -Pt tolerating clear liquid diet, consuming > 50% with some feeding assistance from RN. AMS improving with hydration -Pt was willing to drink vanilla Glucerna with encouragement from wife  11/16: -Appetite slowly improving -Ate 50% roast and green beans at lunch today.   -States that he drinks 1 Glucerna daily    Height: Ht Readings from Last 1 Encounters:  03/17/14 _0  (1.803 m)    Weight: Wt Readings from Last 1 Encounters:  03/19/14 159 lb 6.3 oz (72.3 kg)    Ideal Body Weight: 172 lb  % Ideal Body Weight: 90%  Wt Readings from Last 10 Encounters:   03/19/14 159 lb 6.3 oz (72.3 kg)  03/09/14 174 lb (78.926 kg)  03/04/14 174 lb 9.6 oz (79.198 kg)  03/03/14 178 lb (80.74 kg)  02/23/14 177 lb 1.6 oz (80.332 kg)  01/31/14 169 lb 1.6 oz (76.703 kg)  12/02/13 189 lb (85.73 kg)  10/01/13 189 lb (85.73 kg)  09/02/13 187 lb 1.9 oz (84.877 kg)  08/15/13 188 lb 8 oz (85.503 kg)    Usual Body Weight: 188 lb  % Usual Body Weight: 82%  BMI:  Body mass index is 22.24 kg/(m^2).  Estimated Nutritional Needs: Kcal: 2100-2300 Protein: 105-115 gram Fluid: >/= 2100 ml daily  Skin: ecchymosis  Diet Order: DIET SOFT  EDUCATION NEEDS: -Education needs addressed   Intake/Output Summary (Last 24 hours) at 03/23/14 1520 Last data filed at 03/23/14 1408  Gross per 24 hour  Intake   3971 ml  Output    850 ml  Net   3121 ml    Last BM: PTA, has hx of constipation   Labs:   Recent Labs Lab 03/18/14 0332 03/19/14 0344 03/21/14 0545 03/22/14 0515 03/23/14 0558  NA 150* 146 140  --   --   K 3.4* 3.8 4.1  --   --   CL 114* 113* 109  --   --   CO2 _1 --   --   BUN 38* 39* 36*  --   --   CREATININE 1.18 1.17 1.03 0.98 1.03  CALCIUM 8.4 7.8* 7.7*  --   --   GLUCOSE 264* 317* 255*  --   --  CBG (last 3)   Recent Labs  03/22/14 2240 03/23/14 0735 03/23/14 1222  GLUCAP 145* 234* 227*    Scheduled Meds: . sodium chloride   Intravenous Once  . sodium chloride   Intravenous Once  . amLODipine  10 mg Oral Daily  . ceFEPime (MAXIPIME) IV  1 g Intravenous 3 times per day  . cloNIDine  0.3 mg Oral BID  . feeding supplement (GLUCERNA SHAKE)  237 mL Oral TID BM  . hydrALAZINE  50 mg Oral 4 times per day  . insulin aspart  0-15 Units Subcutaneous TID WC  . pregabalin  75 mg Oral TID  . senna-docusate  1 tablet Oral BID  . spironolactone  25 mg Oral BID  . vancomycin  750 mg Intravenous Q12H    Continuous Infusions: . dextrose 5 % and 0.45 % NaCl with KCl 20 mEq/L 100 mL/hr at 03/23/14 1310    Past Medical  History  Diagnosis Date  . Carotid stenosis     u/s 5/11: R 0-39% L 40-59% (stable)  . HTN (hypertension)     refractory. unable to cardura in past due to hypotension  . Edema   . PAD (peripheral artery disease)   . Cerebrovascular disease, unspecified   . HLD (hyperlipidemia)   . Bradycardia   . Diabetes mellitus type II   . Hyperkalemia   . Shortness of breath   . Arthritis     " IN MY HANDS "  . Chronic kidney disease, stage III (moderate)   . Stroke   . Cancer     esophageal.     Past Surgical History  Procedure Laterality Date  . Carotid endarterectomy  1/01    right  . Balloon angioplasty, artery Bilateral 04/09/2013    LEFT ILIAC & RT COMMON ILIAC     DR ARIDA    . Femoral artery stent  ?   2010    DR COOPER  . Esophagogastroduodenoscopy N/A 01/31/2014    Procedure: ESOPHAGOGASTRODUODENOSCOPY (EGD);  Surgeon: Milus Banister, MD;  Location: [facility];  Service: Endoscopy;  Laterality: N/A;    Antonieta Iba, RD, LDN Clinical Inpatient Dietitian Pager:  [phone] Weekend and after hours pager:  [phone]

## 2014-03-23 NOTE — Progress Notes (Signed)
[facility] PROGRESS NOTE  RIELY BASKETT EPP:[id number] DOB: [birth year] DOA: 03/17/2014 PCP: Sheela Stack, MD  Assessment/Plan:  Principal Problem:   Acute encephalopathy - Resolved,  most likely multifactorial in the context of uncontrolled HTN and dehydration. Also possible infection although no source identified.  SIRs - In setting of neutropenia and confusion after chemotherapy - Will continue broad coverage with Vancomycin and Cefepime at this moment. No source of infection at the moment. - continue to monitor cbc levels. - Chest x ray (portable) reports persistent left basilar atelectasis or infiltrate and probable small left pleural effusion.  Anemia - worsened value today requiring transfusion. Most likely 2ary effect to chemotherapy. - Will continue to monitor. Last level at 7.7 from 8.2 - Hematologist assisting.  Active Problems:    Esophageal cancer - Pt to f/u with oncologist on discharge for further evaluation and recommendations. - Oncology on board. Would like to thank Dr. Benay Spice for his assistance with this case.  Chemotherapy associated pancytopenia - Oncology on board and assisting - will continue to monitor cbc and transfuse as necessary - Agree on holding aspirin and plavix given drop in platelet count    Dehydration Resolved and patient tolerating soft diet. - nursing reports concern for aspiration. Will obtain speech therapy evaluation.    Hypertensive urgency, malignant: still not well controlled with fluctuating values - Cardene d/c'd - Hydralazine PRN elevated BP - Increased oral hydralazine regimen - Increase spironolactone to 25 mg po bid. Improved blood pressure control on this regimen.    Acute renal failure on chronic stage III - Most likely prerenal etiology given h/o poor oral intake and elevated bun/creatinine ratio - improved with IVF rehydration and improved oral intake.  PAD - stable, will continue aspirin and  plavix  Code Status: full Family Communication: No family at bedside Disposition Plan: Med surg   Consultants:  Hematologist.  Procedures:  None  Antibiotics:  Vancomycin  Cefepime  HPI/Subjective: Pt has no new complaints. No acute issues reported overnight.  Objective: Filed Vitals:   03/23/14 0545  BP: 149/99  Pulse: 62  Temp: 97.6 F (36.4 C)  Resp: 18    Intake/Output Summary (Last 24 hours) at 03/23/14 1033 Last data filed at 03/23/14 0730  Gross per 24 hour  Intake   3005 ml  Output   1050 ml  Net   1955 ml   Filed Weights   03/17/14 2327 03/19/14 0046  Weight: 70.1 kg (154 lb 8.7 oz) 72.3 kg (159 lb 6.3 oz)    Exam:   General:  Pt in nad, alert and awake.   Cardiovascular: rrr, no mrg  Respiratory: cta bl, no wheezes  Abdomen: soft, ND, NT  Musculoskeletal: no clubbing   Data Reviewed: Basic Metabolic Panel:  Recent Labs Lab 03/17/14 1457 03/17/14 1500 03/18/14 0332 03/19/14 0344 03/21/14 0545 03/22/14 0515 03/23/14 0558  NA 148* 149* 150* 146 140  --   --   K 3.8 3.7 3.4* 3.8 4.1  --   --   CL 109  --  114* 113* 109  --   --   CO2 21 23 21 23 22   --   --   GLUCOSE 227* 224* 264* 317* 255*  --   --   BUN 42* 41.1* 38* 39* 36*  --   --   CREATININE 1.45* 1.5* 1.18 1.17 1.03 0.98 1.03  CALCIUM 9.1 9.0 8.4 7.8* 7.7*  --   --    Liver Function Tests:  Recent  Labs Lab 03/17/14 1457 03/17/14 1500  AST 26 16  ALT 11 12  ALKPHOS 134* 136  BILITOT 1.1 1.12  PROT 6.3 6.0*  ALBUMIN 3.5 3.4*   No results for input(s): LIPASE, AMYLASE in the last 168 hours. No results for input(s): AMMONIA in the last 168 hours. CBC:  Recent Labs Lab 03/19/14 0500 03/20/14 0350 03/21/14 0030 03/21/14 0545 03/22/14 0515 03/23/14 0558  WBC 0.5* 0.6* 0.8* 0.5* 0.8* 1.2*  NEUTROABS 0.3* 0.1*  --  0.0* 0.1* 0.2*  HGB 7.7* 6.3* 8.4* 8.1* 8.2* 7.7*  HCT 24.3* 20.2* 26.0* 24.4* 24.2* 23.0*  MCV 89.7 90.2 86.7 86.2 84.9 85.8  PLT 61* 42*  36* 34* 34* 32*   Cardiac Enzymes:  Recent Labs Lab 03/17/14 1510 03/18/14 0332 03/18/14 1044 03/18/14 1637  TROPONINI <0.30 <0.30 <0.30 <0.30   BNP (last 3 results)  Recent Labs  08/15/13 0939  PROBNP 223.8   CBG:  Recent Labs Lab 03/22/14 0749 03/22/14 1150 03/22/14 1638 03/22/14 2240 03/23/14 0735  GLUCAP 242* 217* 163* 145* 234*    Recent Results (from the past 240 hour(s))  MRSA PCR Screening     Status: None   Collection Time: 03/17/14 11:32 PM  Result Value Ref Range Status   MRSA by PCR NEGATIVE NEGATIVE Final    Comment:        The GeneXpert MRSA Assay (FDA approved for NASAL specimens only), is one component of a comprehensive MRSA colonization surveillance program. It is not intended to diagnose MRSA infection nor to guide or monitor treatment for MRSA infections.      Studies: Dg Chest Port 1 View  03/22/2014   CLINICAL DATA:  Cough, congestion, history of esophageal cancer  EXAM: PORTABLE CHEST - 1 VIEW  COMPARISON:  03/17/2014  FINDINGS: Cardiomediastinal silhouette is stable. Right IJ Port-A-Cath is unchanged in position. No pulmonary edema. Persistent left basilar atelectasis or infiltrate and probable small left pleural effusion. Right lung is clear.  IMPRESSION: Persistent left basilar atelectasis or infiltrate and probable small left pleural effusion. Stable right IJ Port-A-Cath position.   Electronically Signed   By: Lahoma Crocker M.D.   On: 03/22/2014 13:40    Scheduled Meds: . sodium chloride   Intravenous Once  . sodium chloride   Intravenous Once  . amLODipine  10 mg Oral Daily  . ceFEPime (MAXIPIME) IV  1 g Intravenous 3 times per day  . cloNIDine  0.3 mg Oral BID  . feeding supplement (GLUCERNA SHAKE)  237 mL Oral TID BM  . hydrALAZINE  50 mg Oral 4 times per day  . insulin aspart  0-15 Units Subcutaneous TID WC  . pregabalin  75 mg Oral TID  . senna-docusate  1 tablet Oral BID  . spironolactone  25 mg Oral BID  . vancomycin   750 mg Intravenous Q12H   Continuous Infusions: . dextrose 5 % and 0.45 % NaCl with KCl 20 mEq/L 100 mL/hr at 03/23/14 0117     Time spent: > 35 minutes    Velvet Bathe  [facility] Pager [phone]. If 7PM-7AM, please contact night-coverage at [email or web address], password [facility] 03/23/2014, 10:33 AM  LOS: 6 days

## 2014-03-24 ENCOUNTER — Other Ambulatory Visit: Payer: Self-pay | Admitting: Nurse Practitioner

## 2014-03-24 ENCOUNTER — Telehealth: Payer: Self-pay | Admitting: Oncology

## 2014-03-24 DIAGNOSIS — C159 Malignant neoplasm of esophagus, unspecified: Secondary | ICD-10-CM

## 2014-03-24 LAB — CBC WITH DIFFERENTIAL/PLATELET
BASOS ABS: 0 10*3/uL (ref 0.0–0.1)
Basophils Relative: 2 % — ABNORMAL HIGH (ref 0–1)
Eosinophils Absolute: 0 10*3/uL (ref 0.0–0.7)
Eosinophils Relative: 1 % (ref 0–5)
HCT: 25.9 % — ABNORMAL LOW (ref 39.0–52.0)
Hemoglobin: 8.6 g/dL — ABNORMAL LOW (ref 13.0–17.0)
LYMPHS ABS: 1 10*3/uL (ref 0.7–4.0)
LYMPHS PCT: 44 % (ref 12–46)
MCH: 28.3 pg (ref 26.0–34.0)
MCHC: 33.2 g/dL (ref 30.0–36.0)
MCV: 85.2 fL (ref 78.0–100.0)
MONOS PCT: 22 % — AB (ref 3–12)
Monocytes Absolute: 0.5 10*3/uL (ref 0.1–1.0)
NEUTROS PCT: 31 % — AB (ref 43–77)
Neutro Abs: 0.7 10*3/uL — ABNORMAL LOW (ref 1.7–7.7)
PLATELETS: 49 10*3/uL — AB (ref 150–400)
RBC: 3.04 MIL/uL — AB (ref 4.22–5.81)
RDW: 14 % (ref 11.5–15.5)
WBC: 2.2 10*3/uL — AB (ref 4.0–10.5)

## 2014-03-24 LAB — GLUCOSE, CAPILLARY
GLUCOSE-CAPILLARY: 195 mg/dL — AB (ref 70–99)
Glucose-Capillary: 220 mg/dL — ABNORMAL HIGH (ref 70–99)
Glucose-Capillary: 191 mg/dL — ABNORMAL HIGH (ref 70–99)
Glucose-Capillary: 143 mg/dL — ABNORMAL HIGH (ref 70–99)

## 2014-03-24 LAB — CREATININE, SERUM
CREATININE: 1.08 mg/dL (ref 0.50–1.35)
GFR calc non Af Amer: 62 mL/min — ABNORMAL LOW (ref >90)
GFR, EST AFRICAN AMERICAN: 72 mL/min — AB (ref >90)

## 2014-03-24 MED ORDER — SALINE SPRAY 0.65 % NA SOLN
1.0000 | NASAL | Status: DC | PRN
  Administered 2014-03-24: 1 via NASAL
  Filled 2014-03-24: qty 44

## 2014-03-24 MED ORDER — LEVOFLOXACIN 750 MG PO TABS
750.0000 mg | ORAL_TABLET | Freq: Every day | ORAL | Status: DC
  Administered 2014-03-24 – 2014-03-25 (×2): 750 mg via ORAL
  Filled 2014-03-24 (×2): qty 1

## 2014-03-24 NOTE — Plan of Care (Signed)
Problem: Phase II Progression Outcomes Goal: Progress activity as tolerated unless otherwise ordered Outcome: Progressing Goal: Vital signs remain stable Outcome: Progressing  Problem: Phase III Progression Outcomes Goal: Activity at appropriate level-compared to baseline (UP IN CHAIR FOR HEMODIALYSIS)  Outcome: Progressing Goal: Voiding independently Outcome: Completed/Met Date Met:  03/24/14

## 2014-03-24 NOTE — Progress Notes (Signed)
[facility] PROGRESS NOTE  [name] XNT:[id number] DOB: 1930/02/11 DOA: 03/17/2014 PCP: Sheela Stack, MD  Brief Narrative:  Pt is an 80 y/o with history of Esophageal Cancer undergoing chemotherapy who presented to the hospital with AMS and decreased oral intake.   Assessment/Plan:  Principal Problem:   Acute encephalopathy - Resolved,  most likely multifactorial in the context of uncontrolled HTN and dehydration. Also possible infection although no source identified.  SIRs - In setting of neutropenia and confusion after chemotherapy - Improved on broad coverage with Vancomycin and Cefepime at this moment. No source of infection at the moment was identified.  Chest xray reports atelectasis vs infiltrate on last check. - given improvement will transition to [place].  Anemia - worsened value today requiring transfusion. Most likely 2ary effect to chemotherapy. - Will continue to monitor. Steady at 8.6 - Hematologist assisting.  Active Problems:    Esophageal cancer - Pt to f/u with oncologist on discharge for further evaluation and recommendations. - Oncology on board. Would like to thank Dr. Benay Spice for his assistance with this case.  Chemotherapy associated pancytopenia - Oncology on board and assisting - will continue to monitor cbc and transfuse as necessary - Agree on holding aspirin and plavix given drop in platelet count - Will defer recommendations on when to start ASA and plavix to oncology    Dehydration Resolved and patient tolerating soft diet. - nursing reports concern for aspiration. Will obtain speech therapy evaluation.    Hypertensive urgency, malignant: still not well controlled with fluctuating values - Cardene d/c'd - Hydralazine PRN elevated BP - Increased oral hydralazine regimen - Increase spironolactone to 25 mg po bid. Improved blood pressure control on this regimen.    Acute renal failure on chronic stage III - Most likely  prerenal etiology given h/o poor oral intake and elevated bun/creatinine ratio - improved with IVF rehydration and improved oral intake.  PAD - stable, will continue aspirin and plavix  Code Status: full Family Communication: No family at bedside Disposition Plan: Could consider d/c if continued improvement with levaquin within the next 1-2 days   Consultants:  Hematologist.  Procedures:  None  Antibiotics:  Vancomycin  Cefepime  HPI/Subjective: No new issues reported.  Objective: Filed Vitals:   03/24/14 1041  BP: 160/55  Pulse:   Temp:   Resp:     Intake/Output Summary (Last 24 hours) at 03/24/14 1052 Last data filed at 03/24/14 0900  Gross per 24 hour  Intake 3677.67 ml  Output   1650 ml  Net 2027.67 ml   Filed Weights   03/17/14 2327 03/19/14 0046  Weight: 70.1 kg (154 lb 8.7 oz) 72.3 kg (159 lb 6.3 oz)    Exam:   General:  Pt in nad, alert and awake.   Cardiovascular: rrr, no mrg  Respiratory: cta bl, no wheezes  Abdomen: soft, ND, NT  Musculoskeletal: no clubbing   Data Reviewed: Basic Metabolic Panel:  Recent Labs Lab 03/17/14 1457 03/17/14 1500 03/18/14 0332 03/19/14 0344 03/21/14 0545 03/22/14 0515 03/23/14 0558 03/24/14 0545  NA 148* 149* 150* 146 140  --   --   --   K 3.8 3.7 3.4* 3.8 4.1  --   --   --   CL 109  --  114* 113* 109  --   --   --   CO2 21 23 21 23 22   --   --   --   GLUCOSE 227* 224* 264* 317* 255*  --   --   --  BUN 42* 41.1* 38* 39* 36*  --   --   --   CREATININE 1.45* 1.5* 1.18 1.17 1.03 0.98 1.03 1.08  CALCIUM 9.1 9.0 8.4 7.8* 7.7*  --   --   --    Liver Function Tests:  Recent Labs Lab 03/17/14 1457 03/17/14 1500  AST 26 16  ALT 11 12  ALKPHOS 134* 136  BILITOT 1.1 1.12  PROT 6.3 6.0*  ALBUMIN 3.5 3.4*   No results for input(s): LIPASE, AMYLASE in the last 168 hours. No results for input(s): AMMONIA in the last 168 hours. CBC:  Recent Labs Lab 03/20/14 0350 03/21/14 0030  03/21/14 0545 03/22/14 0515 03/23/14 0558 03/24/14 0545  WBC 0.6* 0.8* 0.5* 0.8* 1.2* 2.2*  NEUTROABS 0.1*  --  0.0* 0.1* 0.2* 0.7*  HGB 6.3* 8.4* 8.1* 8.2* 7.7* 8.6*  HCT 20.2* 26.0* 24.4* 24.2* 23.0* 25.9*  MCV 90.2 86.7 86.2 84.9 85.8 85.2  PLT 42* 36* 34* 34* 32* 49*   Cardiac Enzymes:  Recent Labs Lab 03/17/14 1510 03/18/14 0332 03/18/14 1044 03/18/14 1637  TROPONINI <0.30 <0.30 <0.30 <0.30   BNP (last 3 results)  Recent Labs  08/15/13 0939  PROBNP 223.8   CBG:  Recent Labs Lab 03/23/14 0735 03/23/14 1222 03/23/14 1719 03/23/14 2249 03/24/14 0743  GLUCAP 234* 227* 158* 158* 220*    Recent Results (from the past 240 hour(s))  MRSA PCR Screening     Status: None   Collection Time: 03/17/14 11:32 PM  Result Value Ref Range Status   MRSA by PCR NEGATIVE NEGATIVE Final    Comment:        The GeneXpert MRSA Assay (FDA approved for NASAL specimens only), is one component of a comprehensive MRSA colonization surveillance program. It is not intended to diagnose MRSA infection nor to guide or monitor treatment for MRSA infections.      Studies: Dg Chest Port 1 View  03/22/2014   CLINICAL DATA:  Cough, congestion, history of esophageal cancer  EXAM: PORTABLE CHEST - 1 VIEW  COMPARISON:  03/17/2014  FINDINGS: Cardiomediastinal silhouette is stable. Right IJ Port-A-Cath is unchanged in position. No pulmonary edema. Persistent left basilar atelectasis or infiltrate and probable small left pleural effusion. Right lung is clear.  IMPRESSION: Persistent left basilar atelectasis or infiltrate and probable small left pleural effusion. Stable right IJ Port-A-Cath position.   Electronically Signed   By: Lahoma Crocker M.D.   On: 03/22/2014 13:40    Scheduled Meds: . sodium chloride   Intravenous Once  . sodium chloride   Intravenous Once  . amLODipine  10 mg Oral Daily  . cloNIDine  0.3 mg Oral BID  . feeding supplement (GLUCERNA SHAKE)  237 mL Oral TID BM  .  hydrALAZINE  50 mg Oral 4 times per day  . insulin aspart  0-15 Units Subcutaneous TID WC  . pregabalin  75 mg Oral TID  . senna-docusate  1 tablet Oral BID  . spironolactone  25 mg Oral BID   Continuous Infusions: . dextrose 5 % and 0.45 % NaCl with KCl 20 mEq/L 100 mL/hr at 03/24/14 0051     Time spent: > 35 minutes    Velvet Bathe  [facility] Pager [phone]. If 7PM-7AM, please contact night-coverage at [email or web address], password [email or web address] 03/24/2014, 10:52 AM  LOS: 7 days

## 2014-03-24 NOTE — Plan of Care (Signed)
Problem: Phase I Progression Outcomes Goal: Voiding-avoid urinary catheter unless indicated Outcome: Completed/Met Date Met:  03/24/14 Goal: Other Phase I Outcomes/Goals Outcome: Completed/Met Date Met:  03/24/14

## 2014-03-24 NOTE — Progress Notes (Signed)
ANTIBIOTIC CONSULT NOTE - INITIAL  Pharmacy Consult for levofloxacin Indication: continued HCAP coverage  Allergies  Allergen Reactions  . Contrast Media [Iodinated Diagnostic Agents] Other (See Comments)    Reaction:  History of renal failure following contrast requiring temporary dialysis.    . Metformin Hcl Other (See Comments)    Reaction:  Renal failure----Pt states that "metformin will kill me".  . Aliskiren Rash  . Doxazosin Mesylate Rash    Patient Measurements: Height: 5\' 11"  (180.3 cm) Weight: 159 lb 6.3 oz (72.3 kg) IBW/kg (Calculated) : 75.3  Assessment: 80yo M admitted 11/10 w/ AMS and poor intake. Currently undergoing chemotherapy for GE junction cancer, last given on 11/6. Pharmacy originally asked to dose Vancomycin/Cefepime for possible HCAP(aspiration?), now transitioning to PO levofloxacin.  Antiinfectives  11/10 >> Cefepime >> 11/17 11/10 >> Vancomycin >> 11/17 11/17 >> levofloxacin >>  Labs / vitals Tmax: now afebrile WBCs: slowly improving, WBCs 2.2. ANC 0.7 (Neulasta 11/7) Renal: CKD3. SCr stable at 1.08 (baseline 1.3-1.5), 53CG/N  No cultures  Drug level / dose changes info: 11/12: Increase Vanc to 750mg  Q12H for improved renal function. 11/14: Change Cefepime from 1g q24h to q8h for improved renal function. VT = 19.2 on 750 q12h. RN already gave 9pm dose. Delay next dose until 2pm 11/15, then continue 750 q12h and follow SCr daily, recheck VT in 2 to 3 days.  Goal of Therapy:  levofloxacin per indication and renal function  Plan:  - start levofloxacin 750mg  PO daily today at 1400 - follow-up clinical course, renal function - follow-up antibiotic length of therapy  Thank you for the consult.  Currie Paris, PharmD, BCPS Pager: [phone] Pharmacy: [phone] 03/24/2014 11:07 AM

## 2014-03-24 NOTE — Telephone Encounter (Signed)
s.w. pt wife and advised on DEC appt....ok and aware °

## 2014-03-25 LAB — CBC WITH DIFFERENTIAL/PLATELET
Basophils Absolute: 0 10*3/uL (ref 0.0–0.1)
Basophils Relative: 1 % (ref 0–1)
EOS ABS: 0 10*3/uL (ref 0.0–0.7)
Eosinophils Relative: 0 % (ref 0–5)
HEMATOCRIT: 22.4 % — AB (ref 39.0–52.0)
Hemoglobin: 7.5 g/dL — ABNORMAL LOW (ref 13.0–17.0)
LYMPHS PCT: 44 % (ref 12–46)
Lymphs Abs: 0.7 10*3/uL (ref 0.7–4.0)
MCH: 29 pg (ref 26.0–34.0)
MCHC: 33.5 g/dL (ref 30.0–36.0)
MCV: 86.5 fL (ref 78.0–100.0)
Monocytes Absolute: 0.3 10*3/uL (ref 0.1–1.0)
Monocytes Relative: 17 % — ABNORMAL HIGH (ref 3–12)
NEUTROS ABS: 0.6 10*3/uL — AB (ref 1.7–7.7)
Neutrophils Relative %: 38 % — ABNORMAL LOW (ref 43–77)
Platelets: 42 10*3/uL — ABNORMAL LOW (ref 150–400)
RBC: 2.59 MIL/uL — ABNORMAL LOW (ref 4.22–5.81)
RDW: 14.3 % (ref 11.5–15.5)
WBC: 1.6 10*3/uL — ABNORMAL LOW (ref 4.0–10.5)

## 2014-03-25 LAB — CREATININE, SERUM
Creatinine, Ser: 1.09 mg/dL (ref 0.50–1.35)
GFR calc Af Amer: 71 mL/min — ABNORMAL LOW (ref >90)
GFR calc non Af Amer: 61 mL/min — ABNORMAL LOW (ref >90)

## 2014-03-25 LAB — GLUCOSE, CAPILLARY: GLUCOSE-CAPILLARY: 189 mg/dL — AB (ref 70–99)

## 2014-03-25 MED ORDER — LEVOFLOXACIN 750 MG PO TABS: 750.0000 mg | ORAL_TABLET | Freq: Every day | ORAL | Status: DC

## 2014-03-25 MED ORDER — HYDROCODONE-IBUPROFEN 5-200 MG PO TABS: 1.0000 | ORAL_TABLET | Freq: Three times a day (TID) | ORAL | Status: DC | PRN

## 2014-03-25 MED ORDER — HEPARIN SOD (PORK) LOCK FLUSH 100 UNIT/ML IV SOLN
500.0000 [IU] | INTRAVENOUS | Status: DC | PRN
  Filled 2014-03-25: qty 5

## 2014-03-25 MED ORDER — INSULIN ASPART 100 UNIT/ML FLEXPEN: PEN_INJECTOR | SUBCUTANEOUS | Status: DC

## 2014-03-25 MED ORDER — GLUCERNA SHAKE PO LIQD: 237.0000 mL | Freq: Three times a day (TID) | ORAL | Status: DC

## 2014-03-25 MED ORDER — SPIRONOLACTONE 25 MG PO TABS: 25.0000 mg | ORAL_TABLET | Freq: Two times a day (BID) | ORAL | Status: DC

## 2014-03-25 MED ORDER — HEPARIN SOD (PORK) LOCK FLUSH 100 UNIT/ML IV SOLN: 500.0000 [IU] | INTRAVENOUS | Status: DC

## 2014-03-25 NOTE — Progress Notes (Addendum)
IP PROGRESS NOTE  Subjective:  No complaint. He wants to go home.  Objective: Vital signs in last 24 hours: Blood pressure 163/36, pulse 56, temperature 98.5 F (36.9 C), temperature source Oral, resp. rate 18, height $RemoveBe'5\' 11"'bVPbCPHNU$  (1.803 m), weight 159 lb 6.3 oz (72.3 kg), SpO2 97 %.  Intake/Output from previous day: 11/17 0701 - 11/18 0700 In: 3305 [P.O.:960; I.V.:2345] Out: 2925 [Urine:2925]  Physical Exam:  HEENT: no thrush Lungs: scattered rhonchi in the left chest, no respiratory distress Cardiac: regular rate and rhythm Abdomen: soft and nontender Extremities: no leg edema, trace pitting edema at the right lower arm and hand Skin: Erythematous rash in the left groin appears improved  Portacath/PICC-without erythema  Lab Results:  Recent Labs  03/24/14 0545 03/25/14 0530  WBC 2.2* 1.6*  HGB 8.6* 7.5*  HCT 25.9* 22.4*  PLT 49* 42*  ANC 0.6  BMET  Recent Labs  03/24/14 0545 03/25/14 0530  CREATININE 1.08 1.09    Studies/Results: No results found.  Medications: I have reviewed the patient's current medications.  Assessment/Plan:  1. GE junction adenocarcinoma  EGD 01/31/2014 with an ulcerated circumferential 2-3 cm mass at the GE junction all on the esophageal side causing incomplete stricture.   Biopsy showed invasive poorly differentiated adenocarcinoma; no amplification of HER-2.   CT abdomen/pelvis 01/30/2014 showed new hypodensities in the liver concerning for metastatic disease.  PET scan 03/04/2014 confirmed a hypermetabolic GE junction mass, numerous hypermetabolic liver metastases, and upper abdominal/retroperitoneal nodal metastases  Cycle 1 5-FU/leucovorin/etoposide11/08/2013 with Neulasta support 2. Dysphagia secondary to #1 3. Diabetes mellitus 4. Chronic kidney disease 5. Peripheral neuropathy related to diabetes 6. Peripheral arterial disease 7. Hypertension-Markedly elevated blood pressure on hospital  admission 8. Hyperlipidemia 9. Admission 03/17/2014 with an altered mental status-improved 10. Pancytopenia-baseline anemia/mild pancytopenia, complicated by chemotherapy.  He appears well. The platelet count and neutrophils remain low, but stable.he remains afebrile . Mr. Hellmer denies dysphagia.he appears stable for discharge.  Recommendations:  1.outpatient follow-up at the [facility] as scheduled 2. We will resume aspirin and Plavix when the platelets rise further 3. He should call for a fever or bleeding 4. Complete outpatient course of Levaquin     LOS: 8 days   [place]  03/25/2014, 8:48 AM

## 2014-03-25 NOTE — Plan of Care (Signed)
Problem: Phase II Progression Outcomes Goal: Discharge plan established Outcome: Completed/Met Date Met:  03/25/14 Goal: Vital signs remain stable Outcome: Progressing

## 2014-03-25 NOTE — Care Management Note (Signed)
CARE MANAGEMENT NOTE 03/25/2014  Patient:  [name], [name]   Account Number:  [id number]  Date Initiated:  03/18/2014  Documentation initiated by:  DAVIS,RHONDA  Subjective/Objective Assessment:   Acute encephalopathy  - Agree that this is most likely multifactorial. In the setting of dehydration and uncontrolled HTN (improved values today)  - Still having poor oral intake and still confused on evaluation.  - Will place on D5 1/2 NS     Action/Plan:   hx of ca of esophgus/undergoing chemo/lives at home with wife and has good support through adult children.  Pallaitive care consult?? Wife states he is ready to go home.   Anticipated DC Date:  03/25/2014   Anticipated DC Plan:  HOME/SELF CARE  In-house referral  NA      DC Planning Services  CM consult      PAC Choice  NA   Choice offered to / List presented to:  NA   DME arranged  NA      DME agency  NA     [place] arranged  NA      [place] agency  NA   Status of service:  In process, will continue to follow Medicare Important Message given?  YES (If response is "NO", the following Medicare IM given date fields will be blank) Date Medicare IM given:  03/25/2014 Medicare IM given by:  Marney Doctor Date Additional Medicare IM given:   Additional Medicare IM given by:    Discharge Disposition:    Per UR Regulation:  Reviewed for med. necessity/level of care/duration of stay  If discussed at [place] of Stay Meetings, dates discussed:    Comments:  03/25/14 Marney Doctor RN,BSN,NCM   [id number] Rosana Hoes, RN, BSN, CCM Chart reviewed. Discharge needs and patient's stay to be reviewed and followed by case manager.

## 2014-03-25 NOTE — Discharge Summary (Signed)
Physician Discharge Summary  [name] WHQ:[id number] DOB: [birth year] DOA: 03/17/2014  PCP: Sheela Stack, MD  Admit date: 03/17/2014 Discharge date: 03/25/2014  Recommendations for Outpatient Follow-up:  Use sliding scale insulin as follows:  CBG 121 - 150: 2 units    CBG 151 - 200: 3 units    CBG 201 - 250: 5 units    CBG 251 - 300: 8 units    CBG 301 - 350: 11 units    CBG 351 - 400: 15 units    Continue Levaquin for 5 days on discharge.  Follow-up in cancer center with Dr. Benay Spice per scheduled appointment.  Please hold off on taking aspirin and Plavix until platelet count improves.  Discharge Diagnoses:  Principal Problem:   Acute encephalopathy Active Problems:   Chronic kidney disease, stage III (moderate)   Esophageal cancer   Dehydration   Hypertensive urgency, malignant   Acute renal failure   SIRS (systemic inflammatory response syndrome)   Protein-calorie malnutrition, severe    Discharge Condition: stable   Diet recommendation: as tolerated   History of present illness:  80 y.o. male with past medical history pf hypertension, diabetes, esophageal cancer who presented to [facility] hospital from an oncologist office for altered mental status and confusion progressively worsening over past few days prior to this admission. He had a chemotherapy about 1 week prior to this admission.  Hospital Course:   Principal Problem:  Acute encephalopathy / dehydration  - most likely multifactorial in the context of dehydration secondary to recent chemotherapy and poor PO intake versus infection - mental status improved since admission.  - please note that pt was on broad spectrum abx for neutropenia (vanco and cefepime)  SIRS - In setting of neutropenia and confusion after chemotherapy - pt was on broad coverage with Vancomycin and Cefepime - No source of infection identified but treat empirically and will be sent home with Levaquin as prescribed    Anemia of chronic disease - secondary to history of malignancy and sequela of chemotherapy  - after 1 unit of blood hgb steady at 8  Active Problems:  Esophageal cancer - Pt to f/u with oncologist on discharge for further evaluation and recommendations. - Oncology on board.  Chemotherapy associated pancytopenia - Oncology on board and assisting throughout the hospital stay  - holding aspirin and plavix given drop in platelet count   Hypertensive urgency, malignant: still not well controlled with fluctuating values - Cardene d/c'd - on discharge, norvasc, hydralazine, lasix and spironolactone     Acute renal failure on chronic stage III - Most likely prerenal etiology given h/o poor oral intake and elevated bun/creatinine ratio - improved with IVF rehydration and improved oral intake.    PAD - stable, holding aspirin and plavix due to drop in platelet count   Code Status: full Family Communication: No family at bedside   Consultants:  Hematologist.  Procedures:  None  Antibiotics:  Vancomycin  Cefepime   Signed:  Leisa Lenz, MD  [facility] 03/25/2014, 10:09 AM  Pager #: [phone]   Discharge Exam: Filed Vitals:   03/25/14 0547  BP: 163/36  Pulse: 56  Temp: 98.5 F (36.9 C)  Resp: 18   Filed Vitals:   03/24/14 1335 03/24/14 1410 03/24/14 2142 03/25/14 0547  BP: 195/58 157/38 162/50 163/36  Pulse: 83  71 56  Temp: 97.8 F (36.6 C)  98.3 F (36.8 C) 98.5 F (36.9 C)  TempSrc: Oral  Oral Oral  Resp: 18  16  18  Height:      Weight:      SpO2: 96%  100% 97%    General: Pt is alert, follows commands appropriately, not in acute distress Cardiovascular: Regular rate and rhythm, S1/S2 appreciated  Respiratory:  no wheezing, no crackles, no rhonchi Abdominal: Soft, non tender, non distended, bowel sounds +, no guarding Extremities: +1-2 LE pitting edema, no cyanosis, pulses palpable bilaterally DP and PT; right upper hand seems  swollen but per pt chronic Neuro: Grossly nonfocal  Discharge Instructions  Discharge Instructions    Call MD for:  difficulty breathing, headache or visual disturbances    Complete by:  As directed      Call MD for:  hives    Complete by:  As directed      Call MD for:  persistant dizziness or light-headedness    Complete by:  As directed      Call MD for:  persistant nausea and vomiting    Complete by:  As directed      Call MD for:  redness, tenderness, or signs of infection (pain, swelling, redness, odor or green/yellow discharge around incision site)    Complete by:  As directed      Call MD for:  severe uncontrolled pain    Complete by:  As directed      Diet - low sodium heart healthy    Complete by:  As directed      Discharge instructions    Complete by:  As directed   Use sliding scale insulin as follows:  CBG 121 - 150: 2 units    CBG 151 - 200: 3 units    CBG 201 - 250: 5 units    CBG 251 - 300: 8 units    CBG 301 - 350: 11 units    CBG 351 - 400: 15 units    Continue Levaquin for 5 days on discharge.  Follow-up in cancer center with Dr. Benay Spice per scheduled appointment.  Please hold off on taking aspirin and Plavix until platelet count improves.     Increase activity slowly    Complete by:  As directed             Medication List    STOP taking these medications        aspirin 81 MG tablet     clopidogrel 75 MG tablet  Commonly known as:  PLAVIX     HUMALOG MIX 50/50 PEN [place]      TAKE these medications        amLODipine 10 MG tablet  Commonly known as:  NORVASC  Take 1 tablet (10 mg total) by mouth daily.     cloNIDine 0.3 MG tablet  Commonly known as:  CATAPRES  Take 0.6 mg by mouth 2 (two) times daily.     feeding supplement (GLUCERNA SHAKE) Liqd  Take 237 mLs by mouth 3 (three) times daily between meals.     furosemide 20 MG tablet  Commonly known as:  LASIX  Take 1 tablet (20 mg total) by mouth daily as needed (for  swelling).     hydrALAZINE 50 MG tablet  Commonly known as:  APRESOLINE  Take 1.5 tablets (75 mg total) by mouth 3 (three) times daily.     HYDROcodone-acetaminophen 5-325 MG per tablet  Commonly known as:  NORCO/VICODIN  Take 1-2 tablets by mouth every 6 (six) hours as needed for moderate pain or severe pain.     insulin aspart 100  UNIT/ML FlexPen  Commonly known as:  NOVOLOG FLEXPEN  - Use sliding scale as follows   -  CBG 121 - 150: 2 units    -  CBG 151 - 200: 3 units    -  CBG 201 - 250: 5 units    -  CBG 251 - 300: 8 units    -  CBG 301 - 350: 11 units    -  CBG 351 - 400: 15 units     levofloxacin 750 MG tablet  Commonly known as:  LEVAQUIN  Take 1 tablet (750 mg total) by mouth daily.     lidocaine-prilocaine cream  Commonly known as:  EMLA  Apply 1 application topically as needed. Do not rub in. Cover with plastic.     pregabalin 75 MG capsule  Commonly known as:  LYRICA  Take 75 mg by mouth 3 (three) times daily.     PRESCRIPTION MEDICATION  Chemo [facility]     prochlorperazine 5 MG tablet  Commonly known as:  COMPAZINE  Take 1 tablet (5 mg total) by mouth every 6 (six) hours as needed for nausea or vomiting.     senna-docusate 8.6-50 MG per tablet  Commonly known as:  Senokot-S  Take 1 tablet by mouth 2 (two) times daily.     spironolactone 25 MG tablet  Commonly known as:  ALDACTONE  Take 1 tablet (25 mg total) by mouth 2 (two) times daily.     traMADol 50 MG tablet  Commonly known as:  ULTRAM  Take 1 tablet (50 mg total) by mouth every 12 (twelve) hours as needed for moderate pain.           Follow-up Information    Follow up with Betsy Coder, MD.   Specialty:  Oncology   Contact information:   [place] [id number] [phone]        The results of significant diagnostics from this hospitalization (including imaging, microbiology, ancillary and laboratory) are listed below for reference.     Significant Diagnostic Studies: Dg Chest 2 View  03/10/2014   CLINICAL DATA:  Right-sided chest pain after Port-A-Cath placement yesterday.  EXAM: CHEST  2 VIEW  COMPARISON:  Port-A-Cath placement imaging 03/09/2014. Acute abdominal series 01/30/2014.  FINDINGS: A right IJ Port-A-Cath is in place. Tip is at the cavoatrial junction. The heart is mildly enlarged. Left basilar atelectasis is noted. The upper lung fields are clear. There is no pneumothorax. Pleural calcifications are again noted.  IMPRESSION: 1. Interval placement of right IJ Port-A-Cath without radiographic evidence for complication. 2. Progressive left basilar airspace disease, likely reflecting atelectasis. 3. Bilateral pleural calcifications   Electronically Signed   By: Lawrence Santiago M.D.   On: 03/10/2014 15:20   Ct Head Wo Contrast  03/17/2014   CLINICAL DATA:  Confusion, dehydration, altered mental status  EXAM: CT HEAD WITHOUT CONTRAST  TECHNIQUE: Contiguous axial images were obtained from the base of the skull through the vertex without intravenous contrast.  COMPARISON:  05/05/2012 and 8/ 17/12  FINDINGS: No skull fracture is noted. There is mild posterior mucosal thickening left maxillary sinus. Left nasal septum deviation. The mastoid air cells are unremarkable.  No intracranial hemorrhage, mass effect or midline shift. Mild cerebral atrophy. Mild periventricular white matter decreased attenuation probable due to chronic small vessel ischemic changes. There is moderate cerebellar atrophy. No acute cortical infarction. No mass lesion is noted on this unenhanced scan. Atherosclerotic calcifications of carotid siphon and vertebral arteries.  IMPRESSION: No acute intracranial abnormality. Mild mucosal thickening posterior aspect of the left maxillary sinus. Mild cerebral atrophy. Moderate cerebral atrophy. Mild periventricular white matter decreased attenuation probable due to chronic small vessel ischemic changes.   Electronically  Signed   By: Lahoma Crocker M.D.   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 Possible metastatic disease involving the liver.  EXAM: NUCLEAR MEDICINE PET SKULL BASE TO THIGH  TECHNIQUE: 8.74 mCi F-18 FDG was injected intravenously. Full-ring PET imaging was performed from the skull base to thigh after the radiotracer. CT data was obtained and used for attenuation correction and anatomic localization.  FASTING BLOOD GLUCOSE:  Value: 1024 mg/dl  COMPARISON:  CT abdomen pelvis dated 01/30/2014  FINDINGS: NECK  No hypermetabolic lymph nodes in the neck.  CHEST  Evaluation of lung parenchyma is constrained by respiratory motion.  Scattered tiny 3-4 mm pulmonary nodules in the bilateral lower lobes (series 6/images 34, 41, and 43), indeterminate.  Mild focal/patchy opacity in the left lower lobe (series 6/ image 44), non FDG avid, possibly atelectasis. Small left pleural effusion. Calcified pleural plaques.  ABDOMEN/PELVIS  Hypermetabolic soft tissue mass at the GE junction (series 4/image 103), corresponding to known GE junction adenocarcinoma, max SUV 7.9.  Numerous hypermetabolic metastases throughout the liver, including a lesion with max SUV 5.2 in the posterior segment right hepatic lobe (PET image 95) and max SUV 4.3 in the lateral segment left hepatic lobe (PET image 105).  No abnormal hypermetabolic activity within the pancreas, adrenal glands, or spleen.  Upper abdominal/retroperitoneal nodes, including a right para-aortic/retrocaval node measuring up to 9 mm (series 4/ image 133), max SUV 4.2.  Small to moderate abdominopelvic ascites. Although worrisome for peritoneal involvement, there is no gross peritoneal disease/omental caking.  Anterior bladder wall thickening.  Ectasia of the infrarenal abdominal aorta measuring up to 2.6 cm.  SKELETON  No focal hypermetabolic activity to  suggest skeletal metastasis.  IMPRESSION: Hypermetabolic soft tissue mass at the junction, corresponding to known GE junction adenocarcinoma.  Numerous hypermetabolic liver metastases.  Upper abdominal/retroperitoneal nodal metastases.  Small to moderate abdominopelvic ascites.  Scattered small bilateral pulmonary nodules measuring 3-4 mm, indeterminate. Small left pleural effusion.  Mild focal/patchy left lower lobe opacity, non FDG avid, possibly atelectasis. Calcified pleural plaques.   Electronically Signed   By: Julian Hy M.D.   On: 03/04/2014 10:26   Ir Fluoro Guide Cv Line Right  03/09/2014   CLINICAL DATA:  Adenocarcinoma of the stomach and esophagus  EXAM: TUNNEL POWER PORT PLACEMENT WITH SUBCUTANEOUS POCKET UTILIZING ULTRASOUND & FLOUROSCOPY  FLUOROSCOPY TIME:  One minutes  MEDICATIONS AND MEDICAL HISTORY: Versed 1 mg, Fentanyl 50 mcg.  As antibiotic prophylaxis, Ancef was ordered pre-procedure and administered intravenously within one hour of incision.  ANESTHESIA/SEDATION: Moderate sedation time: 30 minutes  CONTRAST:  None  PROCEDURE: After written informed consent was obtained, patient was placed in the supine position on angiographic table. The right neck and chest was prepped and draped in a sterile fashion. Lidocaine was utilized for local anesthesia. The right internal jugular vein was noted to be patent initially with ultrasound. Under sonographic guidance, a micropuncture needle was inserted into the right IJ vein (Ultrasound and fluoroscopic image documentation was performed). The needle was removed over an 018 wire which was exchanged for a Amplatz. This was advanced into the IVC. An 8-French dilator  was advanced over the Amplatz.  A small incision was made in the right upper chest over the anterior right second rib. Utilizing blunt dissection, a subcutaneous pocket was created in the caudal direction. The pocket was irrigated with a copious amount of sterile normal saline. The port  catheter was tunneled from the chest incision, and out the neck incision. The reservoir was inserted into the subcutaneous pocket and secured with two 3-0 Ethilon stitches. A peel-away sheath was advanced over the Amplatz wire. The port catheter was cut to measure length and inserted through the peel-away sheath. The peel-away sheath was removed. The chest incision was closed with 3-0 Vicryl interrupted stitches for the subcutaneous tissue and a running of 4-0 Vicryl subcuticular stitch for the skin. The neck incision was closed with a 4-0 Vicryl subcuticular stitch. Derma-bond was applied to both surgical incisions. The port reservoir was flushed and instilled with heparinized saline. No complications.  FINDINGS: A right IJ vein Port-A-Cath is in place with its tip at the cavoatrial junction.  IMPRESSION: Successful 8 French right internal jugular vein power port placement with its tip at the SVC/RA junction.   Electronically Signed   By: Maryclare Bean M.D.   On: 03/09/2014 16:27   Ir US Guide Vasc Access Right  03/09/2014   CLINICAL DATA:  Adenocarcinoma of the stomach and esophagus  EXAM: TUNNEL POWER PORT PLACEMENT WITH SUBCUTANEOUS POCKET UTILIZING ULTRASOUND & FLOUROSCOPY  FLUOROSCOPY TIME:  One minutes  MEDICATIONS AND MEDICAL HISTORY: Versed 1 mg, Fentanyl 50 mcg.  As antibiotic prophylaxis, Ancef was ordered pre-procedure and administered intravenously within one hour of incision.  ANESTHESIA/SEDATION: Moderate sedation time: 30 minutes  CONTRAST:  None  PROCEDURE: After written informed consent was obtained, patient was placed in the supine position on angiographic table. The right neck and chest was prepped and draped in a sterile fashion. Lidocaine was utilized for local anesthesia. The right internal jugular vein was noted to be patent initially with ultrasound. Under sonographic guidance, a micropuncture needle was inserted into the right IJ vein (Ultrasound and fluoroscopic image documentation was  performed). The needle was removed over an 018 wire which was exchanged for a Amplatz. This was advanced into the IVC. An 8-French dilator was advanced over the Amplatz.  A small incision was made in the right upper chest over the anterior right second rib. Utilizing blunt dissection, a subcutaneous pocket was created in the caudal direction. The pocket was irrigated with a copious amount of sterile normal saline. The port catheter was tunneled from the chest incision, and out the neck incision. The reservoir was inserted into the subcutaneous pocket and secured with two 3-0 Ethilon stitches. A peel-away sheath was advanced over the Amplatz wire. The port catheter was cut to measure length and inserted through the peel-away sheath. The peel-away sheath was removed. The chest incision was closed with 3-0 Vicryl interrupted stitches for the subcutaneous tissue and a running of 4-0 Vicryl subcuticular stitch for the skin. The neck incision was closed with a 4-0 Vicryl subcuticular stitch. Derma-bond was applied to both surgical incisions. The port reservoir was flushed and instilled with heparinized saline. No complications.  FINDINGS: A right IJ vein Port-A-Cath is in place with its tip at the cavoatrial junction.  IMPRESSION: Successful 8 French right internal jugular vein power port placement with its tip at the SVC/RA junction.   Electronically Signed   By: Maryclare Bean M.D.   On: 03/09/2014 16:27   Dg Chest [facility]  03/22/2014   CLINICAL DATA:  Cough, congestion, history of esophageal cancer  EXAM: PORTABLE CHEST - 1 VIEW  COMPARISON:  03/17/2014  FINDINGS: Cardiomediastinal silhouette is stable. Right IJ Port-A-Cath is unchanged in position. No pulmonary edema. Persistent left basilar atelectasis or infiltrate and probable small left pleural effusion. Right lung is clear.  IMPRESSION: Persistent left basilar atelectasis or infiltrate and probable small left pleural effusion. Stable right IJ Port-A-Cath  position.   Electronically Signed   By: Lahoma Crocker M.D.   On: 03/22/2014 13:40   Dg Chest Port 1 View  03/17/2014   CLINICAL DATA:  GE junction cancer. Nausea, vomiting and dehydration. Chemotherapy last week. Altered mental status, confusion.  EXAM: PORTABLE CHEST - 1 VIEW  COMPARISON:  03/10/2014  FINDINGS: Right Port-A-Cath remains in place, unchanged. Right Port-A-Cath remains in place, unchanged. Left basilar airspace opacity again noted, stable. No confluent opacity on the right. Mild interstitial prominence throughout the lungs. Cannot completely exclude interstitial edema. Calcified pleural plaques bilaterally.  IMPRESSION: Continued left basilar atelectasis or infiltrate.  Increasing interstitial prominence. Cannot exclude interstitial edema.   Electronically Signed   By: Rolm Baptise M.D.   On: 03/17/2014 16:11    Microbiology: Recent Results (from the past 240 hour(s))  MRSA PCR Screening     Status: None   Collection Time: 03/17/14 11:32 PM  Result Value Ref Range Status   MRSA by PCR NEGATIVE NEGATIVE Final    Comment:        The GeneXpert MRSA Assay (FDA approved for NASAL specimens only), is one component of a comprehensive MRSA colonization surveillance program. It is not intended to diagnose MRSA infection nor to guide or monitor treatment for MRSA infections.      Labs: Basic Metabolic Panel:  Recent Labs Lab 03/19/14 0344 03/21/14 0545 03/22/14 0515 03/23/14 0558 03/24/14 0545 03/25/14 0530  NA 146 140  --   --   --   --   K 3.8 4.1  --   --   --   --   CL 113* 109  --   --   --   --   CO2 23 22  --   --   --   --   GLUCOSE 317* 255*  --   --   --   --   BUN 39* 36*  --   --   --   --   CREATININE 1.17 1.03 0.98 1.03 1.08 1.09  CALCIUM 7.8* 7.7*  --   --   --   --    Liver Function Tests: No results for input(s): AST, ALT, ALKPHOS, BILITOT, PROT, ALBUMIN in the last 168 hours. No results for input(s): LIPASE, AMYLASE in the last 168 hours. No  results for input(s): AMMONIA in the last 168 hours. CBC:  Recent Labs Lab 03/21/14 0545 03/22/14 0515 03/23/14 0558 03/24/14 0545 03/25/14 0530  WBC 0.5* 0.8* 1.2* 2.2* 1.6*  NEUTROABS 0.0* 0.1* 0.2* 0.7* 0.6*  HGB 8.1* 8.2* 7.7* 8.6* 7.5*  HCT 24.4* 24.2* 23.0* 25.9* 22.4*  MCV 86.2 84.9 85.8 85.2 86.5  PLT 34* 34* 32* 49* 42*   Cardiac Enzymes:  Recent Labs Lab 03/18/14 1044 03/18/14 1637  TROPONINI <0.30 <0.30   BNP: BNP (last 3 results)  Recent Labs  08/15/13 0939  PROBNP 223.8   CBG:  Recent Labs Lab 03/24/14 0743 03/24/14 1139 03/24/14 1705 03/24/14 2141 03/25/14 0750  GLUCAP 220* 195* 191* 143* 189*    Time coordinating discharge:  Over 30 minutes

## 2014-03-25 NOTE — Discharge Instructions (Signed)
Neutropenia °Neutropenia is a condition that occurs when the level of a certain type of white blood cell (neutrophil) in your body becomes lower than normal. Neutrophils are made in the bone marrow and fight infections. These cells protect against bacteria and viruses. The fewer neutrophils you have, and the longer your body remains without them, the greater your risk of getting a severe infection becomes. °CAUSES  °The cause of neutropenia may be hard to determine. However, it is usually due to 3 main problems:  °· Decreased production of neutrophils. This may be due to: °¨ Certain medicines such as chemotherapy. °¨ Genetic problems. °¨ Cancer. °¨ Radiation treatments. °¨ Vitamin deficiency. °¨ Some pesticides. °· Increased destruction of neutrophils. This may be due to: °¨ Overwhelming infections. °¨ Hemolytic anemia. This is when the body destroys its own blood cells. °¨ Chemotherapy. °· Neutrophils moving to areas of the body where they cannot fight infections. This may be due to: °¨ Dialysis procedures. °¨ Conditions where the spleen becomes enlarged. Neutrophils are held in the spleen and are not available to the rest of the body. °¨ Overwhelming infections. The neutrophils are held in the area of the infection and are not available to the rest of the body. °SYMPTOMS  °There are no specific symptoms of neutropenia. The lack of neutrophils can result in an infection, and an infection can cause various problems. °DIAGNOSIS  °Diagnosis is made by a blood test. A complete blood count is performed. The normal level of neutrophils in human blood differs with age and race. Infants have lower counts than older children and adults. African Americans have lower counts than Caucasians or Asians. The average adult level is 1500 cells/mm3 of blood. Neutrophil counts are interpreted as follows: °· Greater than 1000 cells/mm3 gives normal protection against infection. °· 500 to 1000 cells/mm3 gives an increased risk for  infection. °· 200 to 500 cells/mm3 is a greater risk for severe infection. °· Lower than 200 cells/mm3 is a marked risk of infection. This may require hospitalization and treatment with antibiotic medicines. °TREATMENT  °Treatment depends on the underlying cause, severity, and presence of infections or symptoms. It also depends on your health. Your caregiver will discuss the treatment plan with you. Mild cases are often easily treated and have a good outcome. Preventative measures may also be started to limit your risk of infections. Treatment can include: °· Taking antibiotics. °· Stopping medicines that are known to cause neutropenia. °· Correcting nutritional deficiencies by eating green vegetables to supply folic acid and taking vitamin B supplements. °· Stopping exposure to pesticides if your neutropenia is related to pesticide exposure. °· Taking a blood growth factor called sargramostim, pegfilgrastim, or filgrastim if you are undergoing chemotherapy for cancer. This stimulates white blood cell production. °· Removal of the spleen if you have Felty's syndrome and have repeated infections. °HOME CARE INSTRUCTIONS  °· Follow your caregiver's instructions about when you need to have blood work done. °· Wash your hands often. Make sure others who come in contact with you also wash their hands. °· Wash raw fruits and vegetables before eating them. They can carry bacteria and fungi. °· Avoid people with colds or spreadable (contagious) diseases (chickenpox, herpes zoster, influenza). °· Avoid large crowds. °· Avoid construction areas. The dust can release fungus into the air. °· Be cautious around children in daycare or school environments. °· Take care of your respiratory system by coughing and deep breathing. °· Bathe daily. °· Protect your skin from cuts and   burns. °· Do not work in the garden or with flowers and plants. °· Care for the mouth before and after meals by brushing with a soft toothbrush. If you have  mucositis, do not use mouthwash. Mouthwash contains alcohol and can dry out the mouth even more. °· Clean the area between the genitals and the anus (perineal area) after urination and bowel movements. Women need to wipe from front to back. °· Use a water soluble lubricant during sexual intercourse and practice good hygiene after. Do not have intercourse if you are severely neutropenic. Check with your caregiver for guidelines. °· Exercise daily as tolerated. °· Avoid people who were vaccinated with a live vaccine in the past 30 days. You should not receive live vaccines (polio, typhoid). °· Do not provide direct care for pets. Avoid animal droppings. Do not clean litter boxes and bird cages. °· Do not share food utensils. °· Do not use tampons, enemas, or rectal suppositories unless directed by your caregiver. °· Use an electric razor to remove hair. °· Wash your hands after handling magazines, letters, and newspapers. °SEEK IMMEDIATE MEDICAL CARE IF:  °· You have a fever. °· You have chills or start to shake. °· You feel nauseous or vomit. °· You develop mouth sores. °· You develop aches and pains. °· You have redness and swelling around open wounds. °· Your skin is warm to the touch. °· You have pus coming from your wounds. °· You develop swollen lymph nodes. °· You feel weak or fatigued. °· You develop red streaks on the skin. °MAKE SURE YOU: °· Understand these instructions. °· Will watch your condition. °· Will get help right away if you are not doing well or get worse. °Document Released: 10/14/2001 Document Revised: 07/17/2011 Document Reviewed: 11/11/2010 °ExitCare® Patient Information ©2015 [employer]. This information is not intended to replace advice given to you by your health care provider. Make sure you discuss any questions you have with your health care provider. °Thrombocytopenia °Thrombocytopenia is a condition in which there is an abnormally small number of platelets in your blood. Platelets  are also called thrombocytes. Platelets are needed for blood clotting. °CAUSES °Thrombocytopenia is caused by:  °· Decreased production of platelets. This can be caused by: °¨ Aplastic anemia in which your bone marrow quits making blood cells. °¨ Cancer in the bone marrow. °¨ Use of certain medicines, including chemotherapy. °¨ Infection in the bone marrow. °¨ Heavy alcohol consumption. °· Increased destruction of platelets. This can be caused by: °¨ Certain immune diseases. °¨ Use of certain drugs. °¨ Certain blood clotting disorders. °¨ Certain inherited disorders. °¨ Certain bleeding disorders. °¨ Pregnancy. °· Having an enlarged spleen (hypersplenism). In hypersplenism, the spleen gathers up platelets from circulation. This means the platelets are not available to help with blood clotting. The spleen can enlarge due to cirrhosis or other conditions. °SYMPTOMS  °The symptoms of thrombocytopenia are side effects of poor blood clotting. Some of these are: °· Abnormal bleeding. °· Nosebleeds. °· Heavy menstrual periods. °· Blood in the urine or stools. °· Purpura. This is a purplish discoloration in the skin produced by small bleeding vessels near the surface of the skin. °· Bruising. °· A rash that may be petechial. This looks like pinpoint, purplish-red spots on the skin and mucous membranes. It is caused by bleeding from small blood vessels (capillaries). °DIAGNOSIS  °Your caregiver will make this diagnosis based on your exam and blood tests. Sometimes, a bone marrow study is done to look   for the original cells (megakaryocytes) that make platelets. °TREATMENT  °Treatment depends on the cause of the condition. °· Medicines may be given to help protect your platelets from being destroyed. °· In some cases, a replacement (transfusion) of platelets may be required to stop or prevent bleeding. °· Sometimes, the spleen must be surgically removed. °HOME CARE INSTRUCTIONS  °· Check the skin and linings inside your mouth  for bruising or bleeding as directed by your caregiver. °· Check your sputum, urine, and stool for blood as directed by your caregiver. °· Do not return to any activities that could cause bumps or bruises until your caregiver says it is okay. °· Take extra care not to cut yourself when shaving or when using scissors, needles, knives, and other tools. °· Take extra care not to burn yourself when ironing or cooking. °· Ask your caregiver if it is okay for you to drink alcohol. °· Only take over-the-counter or prescription medicines as directed by your caregiver. °· Notify all your caregivers, including dentists and eye doctors, about your condition. °SEEK IMMEDIATE MEDICAL CARE IF:  °· You develop active bleeding from anywhere in your body. °· You develop unexplained bruising or bleeding. °· You have blood in your sputum, urine, or stool. °MAKE SURE YOU: °· Understand these instructions. °· Will watch your condition. °· Will get help right away if you are not doing well or get worse. °Document Released: 04/24/2005 Document Revised: 07/17/2011 Document Reviewed: 02/24/2011 °ExitCare® Patient Information ©2015 [employer]. This information is not intended to replace advice given to you by your health care provider. Make sure you discuss any questions you have with your health care provider. ° °

## 2014-03-27 ENCOUNTER — Other Ambulatory Visit (HOSPITAL_BASED_OUTPATIENT_CLINIC_OR_DEPARTMENT_OTHER): Payer: Medicare Other

## 2014-03-27 ENCOUNTER — Telehealth: Payer: Self-pay | Admitting: Nurse Practitioner

## 2014-03-27 ENCOUNTER — Ambulatory Visit (HOSPITAL_BASED_OUTPATIENT_CLINIC_OR_DEPARTMENT_OTHER): Payer: Medicare Other | Admitting: Nurse Practitioner

## 2014-03-27 ENCOUNTER — Encounter: Payer: Self-pay | Admitting: Nurse Practitioner

## 2014-03-27 ENCOUNTER — Telehealth: Payer: Self-pay | Admitting: *Deleted

## 2014-03-27 ENCOUNTER — Ambulatory Visit (HOSPITAL_COMMUNITY)
Admission: RE | Admit: 2014-03-27 | Discharge: 2014-03-27 | Disposition: A | Discharge status: Home / Self Care | Payer: Medicare Other | Source: Ambulatory Visit | Attending: Nurse Practitioner | Admitting: Nurse Practitioner

## 2014-03-27 VITALS — BP 170/58 | HR 98 | Temp 98.0°F | Resp 17

## 2014-03-27 DIAGNOSIS — Z9181 History of falling: Secondary | ICD-10-CM

## 2014-03-27 DIAGNOSIS — C159 Malignant neoplasm of esophagus, unspecified: Secondary | ICD-10-CM

## 2014-03-27 DIAGNOSIS — R0602 Shortness of breath: Secondary | ICD-10-CM | POA: Diagnosis present

## 2014-03-27 DIAGNOSIS — R0600 Dyspnea, unspecified: Secondary | ICD-10-CM

## 2014-03-27 DIAGNOSIS — R609 Edema, unspecified: Secondary | ICD-10-CM

## 2014-03-27 DIAGNOSIS — C160 Malignant neoplasm of cardia: Secondary | ICD-10-CM

## 2014-03-27 DIAGNOSIS — J90 Pleural effusion, not elsewhere classified: Secondary | ICD-10-CM | POA: Insufficient documentation

## 2014-03-27 DIAGNOSIS — R05 Cough: Secondary | ICD-10-CM | POA: Diagnosis present

## 2014-03-27 DIAGNOSIS — R042 Hemoptysis: Secondary | ICD-10-CM

## 2014-03-27 HISTORY — DX: History of falling: Z91.81

## 2014-03-27 LAB — CBC WITH DIFFERENTIAL/PLATELET
BASO%: 0.4 % (ref 0.0–2.0)
BASOS ABS: 0 10*3/uL (ref 0.0–0.1)
EOS ABS: 0 10*3/uL (ref 0.0–0.5)
EOS%: 0 % (ref 0.0–7.0)
HCT: 28.2 % — ABNORMAL LOW (ref 38.4–49.9)
HEMOGLOBIN: 9.1 g/dL — AB (ref 13.0–17.1)
LYMPH#: 0.7 10*3/uL — AB (ref 0.9–3.3)
LYMPH%: 15.7 % (ref 14.0–49.0)
MCH: 27.8 pg (ref 27.2–33.4)
MCHC: 32.3 g/dL (ref 32.0–36.0)
MCV: 86.2 fL (ref 79.3–98.0)
MONO#: 0.6 10*3/uL (ref 0.1–0.9)
MONO%: 12.6 % (ref 0.0–14.0)
NEUT%: 71.3 % (ref 39.0–75.0)
NEUTROS ABS: 3.2 10*3/uL (ref 1.5–6.5)
Platelets: 119 10*3/uL — ABNORMAL LOW (ref 140–400)
RBC: 3.27 10*6/uL — AB (ref 4.20–5.82)
RDW: 15.3 % — ABNORMAL HIGH (ref 11.0–14.6)
WBC: 4.5 10*3/uL (ref 4.0–10.3)

## 2014-03-27 MED ORDER — POTASSIUM CHLORIDE CRYS ER 20 MEQ PO TBCR: 20.0000 meq | EXTENDED_RELEASE_TABLET | Freq: Every day | ORAL | Status: DC

## 2014-03-27 MED ORDER — FUROSEMIDE 20 MG PO TABS: ORAL_TABLET | ORAL | Status: DC

## 2014-03-27 NOTE — Assessment & Plan Note (Addendum)
Patient was just discharged from the hospital this past Wednesday, 03/25/2014 with prescription for Levaquin.  He was also prescribed Lasix 20 mg every day; but just initiated Lasix therapy this morning.  He is complaining of worsening dyspnea and occasional congested cough for the past 24 hours.  Is also noted to have some dark blood  hemoptysis when he coughs.  He has bilateral 2+ edema to his lower extremities as well.  Will increase patient's Lasix over the weekend to 40 mg in the morning.  Patient was advised to resume Lasix 20 mg in the morning on Monday, 03/30/2014.  He will also be prescribed potassium tablets 20 mEq every morning.  We will also obtain a repeat chest x-ray this afternoon for followup as well.  I did confirm the patient continues to take Levaquin as previously directed.  Patient is afebrile on exam today.

## 2014-03-27 NOTE — Assessment & Plan Note (Signed)
Patient received cycle one of his etoposide/leucovorin/5-FU chemotherapy regimen 03/11/2014.  He is scheduled to receive cycle 2 of the same regimen on 04/08/2014.

## 2014-03-27 NOTE — Telephone Encounter (Signed)
Received message from pt's brother asking "can he be set up for oxygen; he's having more trouble breathing."  Spoke with pt and he states "been having more trouble breathing lately"  Asked pt if he needed to go to ED; "no I'm not that bad; just think I need oxygen"  Pt denies fever/N/V "my feet are always cold"; decreased eating/drinking.  Instructed pt to come in to be evaluated in [facility] and made Dr. Jeb Levering. Berniece Salines aware of situation.  Pt verbalized he could come in now.

## 2014-03-27 NOTE — Progress Notes (Signed)
will   [facility]   HPI: 39 [address] 80 y.o. 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  Currently undergoing etoposide/leucovorin/5-FU chemotherapy regimen.  Patient received cycle 1 of his current chemotherapy regimen on 03/11/2014.  This chemotherapy regimen is cycled every 28 days.  Patient was admitted to the hospital on 03/17/2014 for altered mental status, dehydration, and hypertension.  He was discharged from the hospital just this past Wednesday, 03/25/2014.  He returns to the [facility] today for followup; and is complaining of increased dyspnea over the past 2 days since he has been home.  He did confirm that he is taking the Levaquin on a daily basis as prescribed.  He states that he was unable to obtain the Lasix prescription until just this morning.  He took Lasix 20 mg once before noon today.  He is also noticed some increased bilateral lower extremity edema and has a productive cough with some dark blood clot this.  He denies any recent fevers or chills.   HPI  CURRENT THERAPY: Upcoming Treatment Dates - GASTRIC ELF q28d Days with orders from any treatment category:  04/08/2014      SCHEDULING COMMUNICATION      ondansetron (ZOFRAN) IVPB 8 mg      dexamethasone (DECADRON) injection 10 mg      leucovorin injection 588 mg      fluorouracil (ADRUCIL) chemo injection 800 mg      etoposide (VEPESID) 120 mg in sodium chloride 0.9 % 500 mL chemo infusion      sodium chloride 0.9 % injection 10 mL      heparin lock flush 100 unit/mL      heparin lock flush 100 unit/mL      alteplase (CATHFLO ACTIVASE) injection 2 mg      sodium chloride 0.9 % injection 3 mL      Cold Pack 1 packet      Hot Pack 1 packet      0.9 %  sodium chloride infusion      TREATMENT CONDITIONS 04/09/2014      SCHEDULING COMMUNICATION      ondansetron (ZOFRAN) IVPB 8 mg      dexamethasone (DECADRON) injection 10 mg      leucovorin injection 588 mg      fluorouracil (ADRUCIL)  chemo injection 800 mg      etoposide (VEPESID) 120 mg in sodium chloride 0.9 % 500 mL chemo infusion      sodium chloride 0.9 % injection 10 mL      heparin lock flush 100 unit/mL      heparin lock flush 100 unit/mL      alteplase (CATHFLO ACTIVASE) injection 2 mg      sodium chloride 0.9 % injection 3 mL      Cold Pack 1 packet      Hot Pack 1 packet      0.9 %  sodium chloride infusion 04/10/2014      SCHEDULING COMMUNICATION      ondansetron (ZOFRAN) IVPB 8 mg      dexamethasone (DECADRON) injection 10 mg      leucovorin injection 588 mg      fluorouracil (ADRUCIL) chemo injection 800 mg      etoposide (VEPESID) 120 mg in sodium chloride 0.9 % 500 mL chemo infusion      sodium chloride 0.9 % injection 10 mL      heparin lock flush 100 unit/mL      heparin lock flush 100 unit/mL  alteplase (CATHFLO ACTIVASE) injection 2 mg      sodium chloride 0.9 % injection 3 mL      Cold Pack 1 packet      Hot Pack 1 packet      0.9 %  sodium chloride infusion    ROS  Past Medical History  Diagnosis Date  . Carotid stenosis     u/s 5/11: R 0-39% L 40-59% (stable)  . HTN (hypertension)     refractory. unable to cardura in past due to hypotension  . Edema   . PAD (peripheral artery disease)   . Cerebrovascular disease, unspecified   . HLD (hyperlipidemia)   . Bradycardia   . Diabetes mellitus type II   . Hyperkalemia   . Shortness of breath   . Arthritis     " IN MY HANDS "  . Chronic kidney disease, stage III (moderate)   . Stroke   . Cancer     esophageal.     Past Surgical History  Procedure Laterality Date  . Carotid endarterectomy  1/01    right  . Balloon angioplasty, artery Bilateral 04/09/2013    LEFT ILIAC & RT COMMON ILIAC     DR ARIDA    . Femoral artery stent  ?   2010    DR COOPER  . Esophagogastroduodenoscopy N/A 01/31/2014    Procedure: ESOPHAGOGASTRODUODENOSCOPY (EGD);  Surgeon: Milus Banister, MD;  Location: [facility];  Service: Endoscopy;   Laterality: N/A;    has DIABETES MELLITUS, TYPE II; HYPERLIPIDEMIA; BRADYCARDIA; CAROTID STENOSIS; CEREBROVASCULAR DISEASE; ATHEROSCLEROSIS W /INT CLAUDICATION; ATHEROSCLEROSIS W/ REST PAIN; ABDOMINAL AORTIC ANEURYSM; EDEMA; PAD (peripheral artery disease); Atherosclerosis of native arteries of the extremities with ulceration(440.23); Chronic kidney disease, stage III (moderate); Claudication in peripheral vascular disease; Esophageal cancer; Dysphagia, unspecified(787.20); Dehydration; Hematoma of right chest wall; Hypertension; Acute encephalopathy; Hypertensive urgency, malignant; Acute renal failure; SIRS (systemic inflammatory response syndrome); Protein-calorie malnutrition, severe; and Dyspnea on his problem list.     is allergic to contrast media; metformin hcl; aliskiren; and doxazosin mesylate.    Medication List       This list is accurate as of: 03/27/14  3:56 PM.  Always use your most recent med list.               amLODipine 10 MG tablet  Commonly known as:  NORVASC  Take 1 tablet (10 mg total) by mouth daily.     cloNIDine 0.3 MG tablet  Commonly known as:  CATAPRES  Take 0.6 mg by mouth 2 (two) times daily.     feeding supplement (GLUCERNA SHAKE) Liqd  Take 237 mLs by mouth 3 (three) times daily between meals.     furosemide 20 MG tablet  Commonly known as:  LASIX  Take 40 mg (2 tabs) PO Q AM for next 2 days; then return to taking 20 mg (1 tab) PO Q AM.     hydrALAZINE 50 MG tablet  Commonly known as:  APRESOLINE  Take 1.5 tablets (75 mg total) by mouth 3 (three) times daily.     HYDROcodone-acetaminophen 5-325 MG per tablet  Commonly known as:  NORCO/VICODIN  Take 1-2 tablets by mouth every 6 (six) hours as needed for moderate pain or severe pain.     hydrocodone-ibuprofen 5-200 MG per tablet  Commonly known as:  VICOPROFEN  Take 1 tablet by mouth every 8 (eight) hours as needed for pain.     insulin aspart 100 UNIT/ML FlexPen  Commonly known as:  NOVOLOG  FLEXPEN  - Use sliding scale as follows   -  CBG 121 - 150: 2 units    -  CBG 151 - 200: 3 units    -  CBG 201 - 250: 5 units    -  CBG 251 - 300: 8 units    -  CBG 301 - 350: 11 units    -  CBG 351 - 400: 15 units     levofloxacin 750 MG tablet  Commonly known as:  LEVAQUIN  Take 1 tablet (750 mg total) by mouth daily.     lidocaine-prilocaine cream  Commonly known as:  EMLA  Apply 1 application topically as needed. Do not rub in. Cover with plastic.     potassium chloride SA 20 MEQ tablet  Commonly known as:  K-DUR,KLOR-CON  Take 1 tablet (20 mEq total) by mouth daily.     pregabalin 75 MG capsule  Commonly known as:  LYRICA  Take 75 mg by mouth 3 (three) times daily.     PRESCRIPTION MEDICATION  Chemo [facility]     prochlorperazine 5 MG tablet  Commonly known as:  COMPAZINE  Take 1 tablet (5 mg total) by mouth every 6 (six) hours as needed for nausea or vomiting.     senna-docusate 8.6-50 MG per tablet  Commonly known as:  Senokot-S  Take 1 tablet by mouth 2 (two) times daily.     spironolactone 25 MG tablet  Commonly known as:  ALDACTONE  Take 1 tablet (25 mg total) by mouth 2 (two) times daily.     traMADol 50 MG tablet  Commonly known as:  ULTRAM  Take 1 tablet (50 mg total) by mouth every 12 (twelve) hours as needed for moderate pain.         PHYSICAL EXAMINATION  Blood pressure 170/58, pulse 98, temperature 98 F (36.7 C), temperature source Oral, resp. rate 17, SpO2 100 %.  Physical Exam  Constitutional: He is oriented to person, place, and time. Vital signs are normal. He appears unhealthy. He appears cachectic.  HENT:  Head: Normocephalic and atraumatic.  Mouth/Throat: Oropharynx is clear and moist.  Eyes: Conjunctivae and EOM are normal. Pupils are equal, round, and reactive to light. Right eye exhibits no discharge. Left eye exhibits no discharge. No scleral icterus.  Neck: Normal range of motion. Neck supple. No JVD present. No  tracheal deviation present. No thyromegaly present.  Cardiovascular: Normal rate, regular rhythm, normal heart sounds and intact distal pulses.   Pulmonary/Chest: Effort normal. No respiratory distress. He has no wheezes. He has no rales. He exhibits no tenderness.  Patient noted in no acute distress.  Patient does have a congested cough with some dark brown hemoptysis.  Bilateral diminished breath sounds; but no rhonchi.  Patient was walked around the [place] on room air; and O2 sats only dropped down to 98%.  Abdominal: Soft. Bowel sounds are normal. He exhibits no distension and no mass. There is no tenderness. There is no rebound and no guarding.  Musculoskeletal: Normal range of motion. He exhibits edema. He exhibits no tenderness.  +2 edema to bilateral lower extremities.  Lymphadenopathy:    He has no cervical adenopathy.  Neurological: He is alert and oriented to person, place, and time. Gait normal.  Skin: Skin is warm and dry. No rash noted. No erythema.  Psychiatric: Affect normal.  Nursing note and vitals reviewed.   LABORATORY DATA:. Appointment on 03/27/2014  Component Date Value Ref Range Status  .  WBC 03/27/2014 4.5  4.0 - 10.3 10e3/uL Final  . NEUT# 03/27/2014 3.2  1.5 - 6.5 10e3/uL Final  . HGB 03/27/2014 9.1* 13.0 - 17.1 g/dL Final  . HCT 03/27/2014 28.2* 38.4 - 49.9 % Final  . Platelets 03/27/2014 119* 140 - 400 10e3/uL Final  . MCV 03/27/2014 86.2  79.3 - 98.0 fL Final  . MCH 03/27/2014 27.8  27.2 - 33.4 pg Final  . MCHC 03/27/2014 32.3  32.0 - 36.0 g/dL Final  . RBC 03/27/2014 3.27* 4.20 - 5.82 10e6/uL Final  . RDW 03/27/2014 15.3* 11.0 - 14.6 % Final  . lymph# 03/27/2014 0.7* 0.9 - 3.3 10e3/uL Final  . MONO# 03/27/2014 0.6  0.1 - 0.9 10e3/uL Final  . Eosinophils Absolute 03/27/2014 0.0  0.0 - 0.5 10e3/uL Final  . Basophils Absolute 03/27/2014 0.0  0.0 - 0.1 10e3/uL Final  . NEUT% 03/27/2014 71.3  39.0 - 75.0 % Final  . LYMPH% 03/27/2014 15.7  14.0 -  49.0 % Final  . MONO% 03/27/2014 12.6  0.0 - 14.0 % Final  . EOS% 03/27/2014 0.0  0.0 - 7.0 % Final  . BASO% 03/27/2014 0.4  0.0 - 2.0 % Final  Admission on 03/17/2014, Discharged on 03/25/2014  No results displayed because visit has over 200 results.       RADIOGRAPHIC STUDIES: No results found.  ASSESSMENT/PLAN:    Esophageal cancer Patient received cycle one of his etoposide/leucovorin/5-FU chemotherapy regimen 03/11/2014.  He is scheduled to receive cycle 2 of the same regimen on 04/08/2014.  Dyspnea Patient was just discharged from the hospital this past Wednesday, 03/25/2014 with prescription for Levaquin.  He was also prescribed Lasix 20 mg every day; but just initiated Lasix therapy this morning.  He is complaining of worsening dyspnea and occasional congested cough for the past 24 hours.  Is also noted to have some dark blood  hemoptysis when he coughs.  He has bilateral 2+ edema to his lower extremities as well.  Will increase patient's Lasix over the weekend to 40 mg in the morning.  Patient was advised to resume Lasix 20 mg in the morning on Monday, 03/30/2014.  He will also be prescribed potassium tablets 20 mEq every morning.  We will also obtain a repeat chest x-ray this afternoon for followup as well.  I did confirm the patient continues to take Levaquin as previously directed.  Patient is afebrile on exam today.  Advised both patient and his 2 brothers that we would call from the [facility] to check on him first thing on Monday morning on 03/30/2014.  Advised patient and his brothers to call or go directly to the emergency department if the weekend if he develops any new or worsening symptoms whatsoever.  Patient stated understanding of all instructions; and was in agreement with this plan of care. The patient knows to call the clinic with any problems, questions or concerns.   Review/collaboration with Dr. Benay Spice regarding all aspects of patient's visit today.   Total  time spent with patient was 40 minutes;  with greater than 75 percent of that time spent in face to face counseling regarding his symptoms, and coordination of care and follow up.  Disclaimer: This note was dictated with voice recognition software. Similar sounding words can inadvertently be transcribed and may not be corrected upon review.   Drue Second, NP 03/27/2014

## 2014-03-27 NOTE — Assessment & Plan Note (Deleted)
Patient received cycle one of his etoposide/leucovorin/5-FU chemotherapy regimen 03/11/2014.  He is scheduled to receive cycle 2 of the same regimen on 04/08/2014.

## 2014-03-27 NOTE — Telephone Encounter (Signed)
Attempted to reach the [place] residence, no answer. LVM for call back.

## 2014-03-27 NOTE — Telephone Encounter (Signed)
Labs/ov per 11/20 POF added pt is aware w/CB..... KJ

## 2014-03-28 ENCOUNTER — Encounter: Payer: Self-pay | Admitting: Oncology

## 2014-03-30 ENCOUNTER — Encounter: Payer: Self-pay | Admitting: *Deleted

## 2014-03-30 ENCOUNTER — Telehealth: Payer: Self-pay | Admitting: *Deleted

## 2014-03-30 ENCOUNTER — Other Ambulatory Visit: Payer: Self-pay | Admitting: *Deleted

## 2014-03-30 NOTE — Telephone Encounter (Signed)
Called patient to follow up on status since work-in on 03/27/14. He reports he does not feel any better than he did Friday. Dyspnea is same, still congested. No fever or hemoptysis. Says he finished his antibiotic. Legs are just as swollen as Friday. Is able to get around his home OK for now. Tells RN his #1 complaint is not sleeping. This bothers him more than the dyspnea. Can't sleep on his back, but breathing better on his side.

## 2014-03-30 NOTE — Telephone Encounter (Signed)
Spoke with daughter and made her aware that MD will see Kinsey on 04/08/14 and decide on dose reduction at that time based on labs/performance status. Did not check CEA-too early. Made her aware that MD suggests Tylenol PM for sleep-she will have another family member get this for him since she lives in [place]. Suggested she come to his appointment on 12/2-she doubts she will be able to come, but will send her daughter. Made nurse aware that Valerian tried to go to his bedroom Friday night instead of sleeping in the bed they made for him in dining room. Reports he fell backwards and they had to call 911 to get him up. Suggested we discuss getting home PT to help him. She is also looking into home health to help them at home. Reports his wife is "not in great shape either".

## 2014-03-30 NOTE — Telephone Encounter (Signed)
Called patient back and instructed him to have his daughter pick up Tylenol PM for him to try at night to sleep. Prefer this route to prescription medication.  Confirmed he took his Lasix 40 mg over weekend and is back on 20 mg /day today. He is also taking his K+ tab daily. Encouraged him to drink water, avoid salt and keep legs elevated above heart level as much as possible.

## 2014-03-30 NOTE — Telephone Encounter (Signed)
Received instructions back from Dr. Benay Spice.  Attempted to call daughter Magdalene Patricia back unsuccessfully.  Left message on voice mail requesting a call back to triage nurse today. Magdalene Patricia   Cell phone     [phone].

## 2014-04-03 ENCOUNTER — Other Ambulatory Visit: Payer: Self-pay | Admitting: *Deleted

## 2014-04-03 ENCOUNTER — Other Ambulatory Visit: Payer: Self-pay | Admitting: Oncology

## 2014-04-03 DIAGNOSIS — C159 Malignant neoplasm of esophagus, unspecified: Secondary | ICD-10-CM

## 2014-04-03 MED ORDER — POLYETHYLENE GLYCOL 3350 17 GM/SCOOP PO POWD: ORAL | Status: DC

## 2014-04-03 MED ORDER — SORBITOL 70 % SOLN: 30.0000 mL | Freq: Every day | Status: DC

## 2014-04-03 NOTE — Telephone Encounter (Signed)
PRINTED FOR PROVIDER REVIEW

## 2014-04-08 ENCOUNTER — Ambulatory Visit (HOSPITAL_BASED_OUTPATIENT_CLINIC_OR_DEPARTMENT_OTHER): Payer: Medicare Other | Admitting: Nurse Practitioner

## 2014-04-08 ENCOUNTER — Other Ambulatory Visit: Payer: Self-pay | Admitting: *Deleted

## 2014-04-08 ENCOUNTER — Telehealth: Payer: Self-pay | Admitting: Oncology

## 2014-04-08 ENCOUNTER — Ambulatory Visit (HOSPITAL_COMMUNITY)
Admission: RE | Admit: 2014-04-08 | Discharge: 2014-04-08 | Disposition: A | Discharge status: Home / Self Care | Payer: Medicare Other | Source: Ambulatory Visit | Attending: Oncology | Admitting: Oncology

## 2014-04-08 ENCOUNTER — Other Ambulatory Visit (HOSPITAL_BASED_OUTPATIENT_CLINIC_OR_DEPARTMENT_OTHER): Payer: Medicare Other

## 2014-04-08 ENCOUNTER — Ambulatory Visit: Payer: Medicare Other

## 2014-04-08 ENCOUNTER — Telehealth: Payer: Self-pay | Admitting: *Deleted

## 2014-04-08 VITALS — BP 147/35 | HR 47 | Temp 97.9°F | Resp 16 | Ht 71.0 in | Wt 167.3 lb

## 2014-04-08 VITALS — BP 174/50 | HR 66 | Temp 97.4°F | Resp 20

## 2014-04-08 DIAGNOSIS — D649 Anemia, unspecified: Secondary | ICD-10-CM

## 2014-04-08 DIAGNOSIS — E0840 Diabetes mellitus due to underlying condition with diabetic neuropathy, unspecified: Secondary | ICD-10-CM

## 2014-04-08 DIAGNOSIS — C159 Malignant neoplasm of esophagus, unspecified: Secondary | ICD-10-CM

## 2014-04-08 DIAGNOSIS — D61818 Other pancytopenia: Secondary | ICD-10-CM

## 2014-04-08 DIAGNOSIS — Z95828 Presence of other vascular implants and grafts: Secondary | ICD-10-CM

## 2014-04-08 DIAGNOSIS — C787 Secondary malignant neoplasm of liver and intrahepatic bile duct: Secondary | ICD-10-CM

## 2014-04-08 DIAGNOSIS — L988 Other specified disorders of the skin and subcutaneous tissue: Secondary | ICD-10-CM

## 2014-04-08 DIAGNOSIS — C160 Malignant neoplasm of cardia: Secondary | ICD-10-CM

## 2014-04-08 DIAGNOSIS — D6481 Anemia due to antineoplastic chemotherapy: Secondary | ICD-10-CM

## 2014-04-08 DIAGNOSIS — N189 Chronic kidney disease, unspecified: Secondary | ICD-10-CM

## 2014-04-08 DIAGNOSIS — D631 Anemia in chronic kidney disease: Secondary | ICD-10-CM

## 2014-04-08 DIAGNOSIS — I739 Peripheral vascular disease, unspecified: Secondary | ICD-10-CM

## 2014-04-08 LAB — COMPREHENSIVE METABOLIC PANEL (CC13)
ALBUMIN: 2.5 g/dL — AB (ref 3.5–5.0)
ALT: 18 U/L (ref 0–55)
AST: 10 U/L (ref 5–34)
Alkaline Phosphatase: 76 U/L (ref 40–150)
Anion Gap: 7 mEq/L (ref 3–11)
BUN: 22.1 mg/dL (ref 7.0–26.0)
CO2: 25 mEq/L (ref 22–29)
Calcium: 8.3 mg/dL — ABNORMAL LOW (ref 8.4–10.4)
Chloride: 106 mEq/L (ref 98–109)
Creatinine: 1.2 mg/dL (ref 0.7–1.3)
GLUCOSE: 166 mg/dL — AB (ref 70–140)
POTASSIUM: 4.8 meq/L (ref 3.5–5.1)
Sodium: 138 mEq/L (ref 136–145)
TOTAL PROTEIN: 4.9 g/dL — AB (ref 6.4–8.3)
Total Bilirubin: 0.27 mg/dL (ref 0.20–1.20)

## 2014-04-08 LAB — PREPARE RBC (CROSSMATCH)

## 2014-04-08 LAB — CBC WITH DIFFERENTIAL/PLATELET
BASO%: 2.1 % — ABNORMAL HIGH (ref 0.0–2.0)
BASOS ABS: 0.1 10*3/uL (ref 0.0–0.1)
EOS ABS: 0 10*3/uL (ref 0.0–0.5)
EOS%: 0.3 % (ref 0.0–7.0)
HCT: 24.3 % — ABNORMAL LOW (ref 38.4–49.9)
HGB: 7.6 g/dL — ABNORMAL LOW (ref 13.0–17.1)
LYMPH%: 18.7 % (ref 14.0–49.0)
MCH: 28.3 pg (ref 27.2–33.4)
MCHC: 31.3 g/dL — ABNORMAL LOW (ref 32.0–36.0)
MCV: 90.3 fL (ref 79.3–98.0)
MONO#: 0.4 10*3/uL (ref 0.1–0.9)
MONO%: 7.6 % (ref 0.0–14.0)
NEUT%: 71.3 % (ref 39.0–75.0)
NEUTROS ABS: 3.3 10*3/uL (ref 1.5–6.5)
PLATELETS: 95 10*3/uL — AB (ref 140–400)
RBC: 2.69 10*6/uL — AB (ref 4.20–5.82)
RDW: 18.6 % — AB (ref 11.0–14.6)
WBC: 4.7 10*3/uL (ref 4.0–10.3)
lymph#: 0.9 10*3/uL (ref 0.9–3.3)

## 2014-04-08 LAB — HOLD TUBE, BLOOD BANK

## 2014-04-08 MED ORDER — HEPARIN SOD (PORK) LOCK FLUSH 100 UNIT/ML IV SOLN
500.0000 [IU] | Freq: Every day | INTRAVENOUS | Status: AC | PRN
  Administered 2014-04-08: 500 [IU]
  Filled 2014-04-08: qty 5

## 2014-04-08 MED ORDER — SODIUM CHLORIDE 0.9 % IJ SOLN
10.0000 mL | INTRAMUSCULAR | Status: AC | PRN
  Administered 2014-04-08: 10 mL
  Filled 2014-04-08: qty 10

## 2014-04-08 MED ORDER — SODIUM CHLORIDE 0.9 % IV SOLN
250.0000 mL | Freq: Once | INTRAVENOUS | Status: AC
  Administered 2014-04-08: 250 mL via INTRAVENOUS

## 2014-04-08 MED ORDER — SODIUM CHLORIDE 0.9 % IJ SOLN
10.0000 mL | INTRAMUSCULAR | Status: DC | PRN
  Administered 2014-04-08: 10 mL via INTRAVENOUS
  Filled 2014-04-08: qty 10

## 2014-04-08 NOTE — Patient Instructions (Signed)

## 2014-04-08 NOTE — Progress Notes (Addendum)
[facility] OFFICE PROGRESS NOTE   Diagnosis:  GE junction adenocarcinoma  INTERVAL HISTORY:   Mr. Dalsanto returns as scheduled. He completed cycle 1 5-FU/leucovorin/etoposide beginning 03/11/2014 with Neulasta support. He was hospitalized on 03/17/2014 with altered mental status. He developed progressive pancytopenia. The absolute neutrophil count declined to a low of 0.0 and platelet count of a low of 32,000. He required red cell transfusion support. He was discharged home on 03/25/2014.  He feels very weak. He is having "mobility issues". No nausea or vomiting. No diarrhea. No dysphagia. He denies pain. Appetite is poor but he is trying to eat. He denies shortness of breath. He continues to have a cough. He recently noted a wound at the left lower leg.  Objective:  Vital signs in last 24 hours:  Blood pressure 147/35, pulse 47, temperature 97.9 F (36.6 C), temperature source Oral, resp. rate 16, height $RemoveBe'5\' 11"'hfzoAsuVr$  (1.803 m), weight 167 lb 4.8 oz (75.887 kg).    HEENT: no thrush or ulcers. Resp: rales at the left lung base. No respiratory distress. Cardio: regular rate and rhythm. GI: abdomen soft and nontender. Vascular: pitting edema below the knees bilaterally. Chronic stasis changes noted.  Skin: At the left lower anterior leg there is an approximate 1 cm draining wound. The drainage is serosanguineous.    Lab Results:  Lab Results  Component Value Date   WBC 4.7 04/08/2014   HGB 7.6* 04/08/2014   HCT 24.3* 04/08/2014   MCV 90.3 04/08/2014   PLT 95* 04/08/2014   NEUTROABS 3.3 04/08/2014    Imaging:  No results found.  Medications: I have reviewed the patient's current medications.  Assessment/Plan: 1. GE junction adenocarcinoma  EGD 01/31/2014 with an ulcerated circumferential 2-3 cm mass at the GE junction all on the esophageal side causing incomplete stricture.   Biopsy showed invasive poorly differentiated adenocarcinoma; no amplification of  HER-2.   CT abdomen/pelvis 01/30/2014 showed new hypodensities in the liver concerning for metastatic disease.  PET scan 03/04/2014 confirmed a hypermetabolic GE junction mass, numerous hypermetabolic liver metastases, and upper abdominal/retroperitoneal nodal metastases  Cycle 1 5-FU/leucovorin/etoposide11/08/2013 with Neulasta support 2. Dysphagia secondary to #1 3. Diabetes mellitus 4. Chronic kidney disease 5. Peripheral neuropathy related to diabetes 6. Peripheral arterial disease 7. Hypertension-Markedly elevated blood pressure on hospital admission 8. Hyperlipidemia 9. Admission 03/17/2014 with an altered mental status-improved 10. Pancytopenia-baseline anemia/mild pancytopenia, complicated by chemotherapy. 11. Left lower leg wound in the setting of peripheral vascular disease and diabetes. 12. Anemia, multifactorial secondary to chemotherapy, renal failure, question blood loss related to the primary tumor.   Disposition: Mr. Eichinger has a poor performance status. He has however noted significant improvement in the dysphagia. We are holding chemotherapy this week and will reschedule for 2 weeks. The chemotherapy will be dose reduced due to myelosuppression following cycle 1.  He has progressive multifactorial anemia. He will receive 2 units of blood today.  We made a referral to the wound clinic for evaluation of the left lower leg wound. We are also making a referral for home physical therapy due to deconditioning, weakness, fall risk.  He will return for a followup visit and chemotherapy on 04/22/2014. He or his daughter will contact the office in the interim with any problems.  Patient seen with Dr. Benay Spice. 25 minutes were spent face-to-face at today's visit with the majority of that time involved in counseling/coordination of care.    Ned Card ANP/GNP-BC   04/08/2014  11:01 AM  This was a  shared visit with Ned Card. Mr. Caba was interviewed and  examined. Chemotherapy will remain on hold until his performance status improves. We will refer him to the wound clinic for evaluation of the ulcerated area at the left tibia.  Julieanne Manson, M.D.

## 2014-04-08 NOTE — Telephone Encounter (Signed)
Pt confirmed labs/ov per 12/02 POF, gave pt AVS..... KJ, sent msg to add chemo and sch pt with [facility] per referral for 12/11 @12 :30 to arrive at 12:20.... KJ

## 2014-04-08 NOTE — Telephone Encounter (Signed)
Per staff message and POF I have scheduled appts. Advised scheduler of appts. JMW  

## 2014-04-08 NOTE — Patient Instructions (Signed)

## 2014-04-09 ENCOUNTER — Ambulatory Visit: Payer: Medicare Other

## 2014-04-09 LAB — TYPE AND SCREEN
ABO/RH(D): A POS
Antibody Screen: NEGATIVE
Unit division: 0
Unit division: 0

## 2014-04-10 ENCOUNTER — Encounter: Payer: Medicare Other | Admitting: Nutrition

## 2014-04-10 ENCOUNTER — Ambulatory Visit: Payer: Medicare Other

## 2014-04-11 ENCOUNTER — Ambulatory Visit: Payer: Medicare Other

## 2014-04-16 ENCOUNTER — Encounter (HOSPITAL_COMMUNITY): Payer: Self-pay | Admitting: Cardiovascular Disease

## 2014-04-17 ENCOUNTER — Telehealth: Payer: Self-pay | Admitting: *Deleted

## 2014-04-17 ENCOUNTER — Encounter (HOSPITAL_BASED_OUTPATIENT_CLINIC_OR_DEPARTMENT_OTHER): Payer: Medicare Other | Attending: Internal Medicine

## 2014-04-17 ENCOUNTER — Encounter: Payer: Self-pay | Admitting: Nurse Practitioner

## 2014-04-17 NOTE — Telephone Encounter (Signed)
Wanted to report he has had some pain in his stomach when he eats today-just started today. Took a dose of Hydrocodone and it resolved. No N/V and his bowels are moving. No dysphagia. Instructed daughter to try pain med every 4-6 hours as needed. Suggested Gas-X or mylanta to see if this helps his discomfort. Be sure food is soft and chewed well. She is not able to make the appointment on 12/16 at the time it is scheduled. [name] can't drive and she is his transport. Asking if there is any way to be seen that day after 10:30 ?  Any appointment needs to be after 10:30 am. Will forward her request to NP for schedule review.

## 2014-04-18 ENCOUNTER — Encounter: Payer: Self-pay | Admitting: Nurse Practitioner

## 2014-04-19 ENCOUNTER — Encounter: Payer: Self-pay | Admitting: Oncology

## 2014-04-21 ENCOUNTER — Telehealth: Payer: Self-pay | Admitting: *Deleted

## 2014-04-21 NOTE — Telephone Encounter (Signed)
VM from scheduler that patient cancelled his appointments this week, saying he won't be available until after December 30th. Patient never mentioned this to RN when I spoke with him today. Was in process of trying to move his appointment to later in the day on 12/16 per daughter's request. Left message for scheduler to please let me know if patient cancelled or his daughter cancelled?

## 2014-04-21 NOTE — Telephone Encounter (Signed)
Called patient at home and he answered the phone. Tells RN he just got out of the shower and is feeling fine. Denies any dizziness or weakness. Getting around the house easily. Drinking at least 5 bottles of water per day and eating soup. Does not eat much, just not hungry. No dysphagia-pills & food going down fine. Denies any stomach/abdominal pain. Does not think he needs to be seen today or go to hospital. Also denies any coughing up blood. Asked to speak with son, Gershon Mussel, but was told he is upstairs sleeping "in what used to be my bed".

## 2014-04-22 ENCOUNTER — Ambulatory Visit: Payer: Medicare Other

## 2014-04-22 ENCOUNTER — Telehealth: Payer: Self-pay | Admitting: Oncology

## 2014-04-22 ENCOUNTER — Other Ambulatory Visit: Payer: Medicare Other

## 2014-04-22 ENCOUNTER — Other Ambulatory Visit (HOSPITAL_COMMUNITY): Payer: Self-pay | Admitting: *Deleted

## 2014-04-22 ENCOUNTER — Telehealth: Payer: Self-pay | Admitting: *Deleted

## 2014-04-22 ENCOUNTER — Other Ambulatory Visit (HOSPITAL_BASED_OUTPATIENT_CLINIC_OR_DEPARTMENT_OTHER): Payer: Medicare Other

## 2014-04-22 ENCOUNTER — Ambulatory Visit (HOSPITAL_BASED_OUTPATIENT_CLINIC_OR_DEPARTMENT_OTHER): Payer: Medicare Other

## 2014-04-22 ENCOUNTER — Ambulatory Visit: Payer: Medicare Other | Admitting: Nurse Practitioner

## 2014-04-22 VITALS — BP 109/42 | HR 44 | Temp 97.8°F

## 2014-04-22 DIAGNOSIS — D631 Anemia in chronic kidney disease: Secondary | ICD-10-CM

## 2014-04-22 DIAGNOSIS — C160 Malignant neoplasm of cardia: Secondary | ICD-10-CM

## 2014-04-22 DIAGNOSIS — N189 Chronic kidney disease, unspecified: Secondary | ICD-10-CM

## 2014-04-22 DIAGNOSIS — Z452 Encounter for adjustment and management of vascular access device: Secondary | ICD-10-CM

## 2014-04-22 DIAGNOSIS — Z95828 Presence of other vascular implants and grafts: Secondary | ICD-10-CM

## 2014-04-22 DIAGNOSIS — D649 Anemia, unspecified: Secondary | ICD-10-CM

## 2014-04-22 DIAGNOSIS — C159 Malignant neoplasm of esophagus, unspecified: Secondary | ICD-10-CM

## 2014-04-22 DIAGNOSIS — I6523 Occlusion and stenosis of bilateral carotid arteries: Secondary | ICD-10-CM

## 2014-04-22 LAB — COMPREHENSIVE METABOLIC PANEL (CC13)
ALT: 24 U/L (ref 0–55)
ANION GAP: 10 meq/L (ref 3–11)
AST: 13 U/L (ref 5–34)
Albumin: 2.7 g/dL — ABNORMAL LOW (ref 3.5–5.0)
Alkaline Phosphatase: 225 U/L — ABNORMAL HIGH (ref 40–150)
BUN: 50.7 mg/dL — ABNORMAL HIGH (ref 7.0–26.0)
CALCIUM: 7.9 mg/dL — AB (ref 8.4–10.4)
CHLORIDE: 103 meq/L (ref 98–109)
CO2: 24 meq/L (ref 22–29)
CREATININE: 1.9 mg/dL — AB (ref 0.7–1.3)
EGFR: 32 mL/min/{1.73_m2} — AB (ref >90)
GLUCOSE: 300 mg/dL — AB (ref 70–140)
Potassium: 4.3 mEq/L (ref 3.5–5.1)
Sodium: 137 mEq/L (ref 136–145)
Total Bilirubin: 0.54 mg/dL (ref 0.20–1.20)
Total Protein: 4.9 g/dL — ABNORMAL LOW (ref 6.4–8.3)

## 2014-04-22 LAB — CBC WITH DIFFERENTIAL/PLATELET
BASO%: 0.7 % (ref 0.0–2.0)
Basophils Absolute: 0.1 10*3/uL (ref 0.0–0.1)
EOS ABS: 0.1 10*3/uL (ref 0.0–0.5)
EOS%: 1.6 % (ref 0.0–7.0)
HEMATOCRIT: 31.7 % — AB (ref 38.4–49.9)
HGB: 10.2 g/dL — ABNORMAL LOW (ref 13.0–17.1)
LYMPH#: 0.9 10*3/uL (ref 0.9–3.3)
LYMPH%: 13.1 % — AB (ref 14.0–49.0)
MCH: 28.8 pg (ref 27.2–33.4)
MCHC: 32.1 g/dL (ref 32.0–36.0)
MCV: 89.6 fL (ref 79.3–98.0)
MONO#: 0.4 10*3/uL (ref 0.1–0.9)
MONO%: 6 % (ref 0.0–14.0)
NEUT%: 78.6 % — AB (ref 39.0–75.0)
NEUTROS ABS: 5.6 10*3/uL (ref 1.5–6.5)
PLATELETS: 129 10*3/uL — AB (ref 140–400)
RBC: 3.54 10*6/uL — ABNORMAL LOW (ref 4.20–5.82)
RDW: 17.8 % — ABNORMAL HIGH (ref 11.0–14.6)
WBC: 7.1 10*3/uL (ref 4.0–10.3)

## 2014-04-22 LAB — HOLD TUBE, BLOOD BANK

## 2014-04-22 MED ORDER — SODIUM CHLORIDE 0.9 % IJ SOLN
10.0000 mL | INTRAMUSCULAR | Status: DC | PRN
  Administered 2014-04-22: 10 mL via INTRAVENOUS
  Filled 2014-04-22: qty 10

## 2014-04-22 MED ORDER — HEPARIN SOD (PORK) LOCK FLUSH 100 UNIT/ML IV SOLN
500.0000 [IU] | Freq: Once | INTRAVENOUS | Status: AC
  Administered 2014-04-22: 500 [IU] via INTRAVENOUS
  Filled 2014-04-22: qty 5

## 2014-04-22 NOTE — Telephone Encounter (Signed)
Made patient aware of improved CBC results from today. Glucose is high at 300. Renal functions elevated, but in his average range per Dr. Benay Spice. He reports he has not taken his meds for diabetes in 2-3 days, saying "I forgot, I've had so much company lately". Has not checked his blood sugar in a few days either. Inquired why he cancelled his office visit and chemo appointments and he said "that chemo liked a killed me and I want to have a good Christmas, so I'll just wait till the first of the year". Reports he finally feels well. Encouraged him to take his meds-put a note on fridge or something to remind him and check his blood sugar daily. Avoid concentrated sweets. Informed him the reason he is feeling better now is because of the chemo. He still wants to wait. MD made aware.

## 2014-04-22 NOTE — Patient Instructions (Signed)

## 2014-04-22 NOTE — Telephone Encounter (Signed)
pt called yesterday to cx appts for this week and r/s for after 12/30. appts rescheduled to the week of 05/13/14. returned call today and s/w pt wife and her care giver re next appt for 05/13/14. schedule mailed. desk nurse informed yesterday that pt called to cx and appts moved to wk of 05/13/14 due to per pt he will not be available again  until after 05/06/14.

## 2014-04-23 ENCOUNTER — Ambulatory Visit: Payer: Medicare Other

## 2014-04-24 ENCOUNTER — Encounter: Payer: Medicare Other | Admitting: Nutrition

## 2014-04-24 ENCOUNTER — Ambulatory Visit: Payer: Medicare Other

## 2014-04-25 ENCOUNTER — Ambulatory Visit: Payer: Medicare Other

## 2014-05-06 ENCOUNTER — Telehealth: Payer: Self-pay | Admitting: *Deleted

## 2014-05-06 ENCOUNTER — Encounter (HOSPITAL_COMMUNITY): Payer: Medicare Other

## 2014-05-06 ENCOUNTER — Ambulatory Visit (HOSPITAL_COMMUNITY): Payer: Medicare Other | Attending: Cardiology

## 2014-05-06 NOTE — Telephone Encounter (Signed)
MyChart message from daughter, Magdalene Patricia reporting weakness and asking for him to be seen this week. Called at 2pm and he was taking a nap-wife not able to provide any information as to how he is feeling. Was told to call back at 3 pm. Returned call at 3 pm-voice mail left on machine.

## 2014-05-07 ENCOUNTER — Other Ambulatory Visit: Payer: Medicare Other

## 2014-05-07 ENCOUNTER — Telehealth: Payer: Self-pay | Admitting: *Deleted

## 2014-05-07 ENCOUNTER — Ambulatory Visit (HOSPITAL_BASED_OUTPATIENT_CLINIC_OR_DEPARTMENT_OTHER): Payer: Medicare Other

## 2014-05-07 ENCOUNTER — Other Ambulatory Visit (HOSPITAL_BASED_OUTPATIENT_CLINIC_OR_DEPARTMENT_OTHER): Payer: Medicare Other

## 2014-05-07 VITALS — BP 185/83 | HR 88 | Temp 97.8°F | Resp 16

## 2014-05-07 DIAGNOSIS — C159 Malignant neoplasm of esophagus, unspecified: Secondary | ICD-10-CM

## 2014-05-07 DIAGNOSIS — C160 Malignant neoplasm of cardia: Secondary | ICD-10-CM

## 2014-05-07 LAB — CBC WITH DIFFERENTIAL/PLATELET
BASO%: 0.7 % (ref 0.0–2.0)
Basophils Absolute: 0.1 10*3/uL (ref 0.0–0.1)
EOS ABS: 0 10*3/uL (ref 0.0–0.5)
EOS%: 0.2 % (ref 0.0–7.0)
HCT: 40.7 % (ref 38.4–49.9)
HGB: 12.9 g/dL — ABNORMAL LOW (ref 13.0–17.1)
LYMPH%: 12.8 % — AB (ref 14.0–49.0)
MCH: 28 pg (ref 27.2–33.4)
MCHC: 31.7 g/dL — AB (ref 32.0–36.0)
MCV: 88.2 fL (ref 79.3–98.0)
MONO#: 0.6 10*3/uL (ref 0.1–0.9)
MONO%: 6.1 % (ref 0.0–14.0)
NEUT%: 80.2 % — ABNORMAL HIGH (ref 39.0–75.0)
NEUTROS ABS: 7.4 10*3/uL — AB (ref 1.5–6.5)
PLATELETS: 129 10*3/uL — AB (ref 140–400)
RBC: 4.62 10*6/uL (ref 4.20–5.82)
RDW: 18 % — ABNORMAL HIGH (ref 11.0–14.6)
WBC: 9.2 10*3/uL (ref 4.0–10.3)
lymph#: 1.2 10*3/uL (ref 0.9–3.3)

## 2014-05-07 LAB — COMPREHENSIVE METABOLIC PANEL (CC13)
ALK PHOS: 98 U/L (ref 40–150)
ALT: 8 U/L (ref 0–55)
AST: 12 U/L (ref 5–34)
Albumin: 3.5 g/dL (ref 3.5–5.0)
Anion Gap: 13 mEq/L — ABNORMAL HIGH (ref 3–11)
BILIRUBIN TOTAL: 1.03 mg/dL (ref 0.20–1.20)
BUN: 21.5 mg/dL (ref 7.0–26.0)
CO2: 24 mEq/L (ref 22–29)
Calcium: 9.1 mg/dL (ref 8.4–10.4)
Chloride: 106 mEq/L (ref 98–109)
Creatinine: 1.4 mg/dL — ABNORMAL HIGH (ref 0.7–1.3)
EGFR: 48 mL/min/{1.73_m2} — ABNORMAL LOW (ref >90)
Glucose: 166 mg/dl — ABNORMAL HIGH (ref 70–140)
Potassium: 4.4 mEq/L (ref 3.5–5.1)
SODIUM: 143 meq/L (ref 136–145)
TOTAL PROTEIN: 6.3 g/dL — AB (ref 6.4–8.3)

## 2014-05-07 LAB — HOLD TUBE, BLOOD BANK

## 2014-05-07 MED ORDER — SODIUM CHLORIDE 0.9 % IJ SOLN
10.0000 mL | Freq: Once | INTRAMUSCULAR | Status: AC
  Administered 2014-05-07: 10 mL
  Filled 2014-05-07: qty 10

## 2014-05-07 MED ORDER — HEPARIN SOD (PORK) LOCK FLUSH 100 UNIT/ML IV SOLN
500.0000 [IU] | Freq: Once | INTRAVENOUS | Status: AC
  Administered 2014-05-07: 500 [IU]
  Filled 2014-05-07: qty 5

## 2014-05-07 NOTE — Telephone Encounter (Signed)
[name] returned call and says her brother will have [name] here by 3 pm today for his labwork.

## 2014-05-07 NOTE — Telephone Encounter (Signed)
Reviewed labs with son and patient-he is not anemic and WBC is good. Blood sugar still high at 166, but better. His BP is high today 185/83-he reports he is taking his BP meds. Pulse was not tachy. Again denies any dyspnea or syncope. Printed med list for son to compare with his bottles at home to be sure he is taking his BP meds.  Informed them it is not anemia making him weak-could be his disease process, after all, he did only have one treatment and decided to delay the 2nd cycle. He also has many other medical issues that could impact how he feels. Encouraged him to eat high protein, low sugar, monitor his BP and take meds. Push po fluids and keep his next appointment for his chemo. His son agreed that this will be done. Routed copy of labs to his PCP, Dr. Forde Dandy.

## 2014-05-07 NOTE — Telephone Encounter (Signed)
Called to follow up on "no show" for his  11:15 lab today. Says he can't come today. Wants to wait until Monday. Made him aware that office will be closed Friday, so if he needs blood the TXM needs to occur today. Otherwise, he will need to wait until Monday. Instructed him to go to ER if he gets short of breath or chest pain or more lightheaded or has a syncope episode. Will try to schedule for a lab on Monday.

## 2014-05-07 NOTE — Telephone Encounter (Signed)
Reports "I'm not feeling well at all". Very weak and lightheaded. Sleeping a lot. No dyspnea or fever. Bowels moving. Able to eat and drink. Suggested he come in to office for lab work to determine if he is anemic enough for transfusion. RN will check on him in the lobby. He will bring someone with him and ask for a wheelchair upon arrival.

## 2014-05-07 NOTE — Addendum Note (Signed)
Addended by: Tania Ade on: 05/07/2014 01:08 PM   Modules accepted: Orders

## 2014-05-07 NOTE — Telephone Encounter (Signed)
Called daughter to make her aware that [name] has declined to come in today and if he is feeling as poorly as he says, he may need to go to the ER over the weekend. She plans to go to his home in few minutes and see what is going on and call back.

## 2014-05-08 ENCOUNTER — Encounter: Payer: Self-pay | Admitting: Oncology

## 2014-05-12 ENCOUNTER — Other Ambulatory Visit: Payer: Self-pay | Admitting: *Deleted

## 2014-05-12 DIAGNOSIS — C159 Malignant neoplasm of esophagus, unspecified: Secondary | ICD-10-CM

## 2014-05-13 ENCOUNTER — Ambulatory Visit: Payer: Medicare Other

## 2014-05-13 ENCOUNTER — Ambulatory Visit (HOSPITAL_BASED_OUTPATIENT_CLINIC_OR_DEPARTMENT_OTHER): Payer: Medicare Other

## 2014-05-13 ENCOUNTER — Telehealth: Payer: Self-pay | Admitting: Oncology

## 2014-05-13 ENCOUNTER — Ambulatory Visit (HOSPITAL_BASED_OUTPATIENT_CLINIC_OR_DEPARTMENT_OTHER): Payer: Medicare Other | Admitting: Nurse Practitioner

## 2014-05-13 ENCOUNTER — Encounter: Payer: Medicare Other | Admitting: Nutrition

## 2014-05-13 ENCOUNTER — Other Ambulatory Visit (HOSPITAL_BASED_OUTPATIENT_CLINIC_OR_DEPARTMENT_OTHER): Payer: Medicare Other

## 2014-05-13 VITALS — BP 154/46 | HR 62 | Temp 97.5°F | Resp 18 | Ht 71.0 in | Wt 142.5 lb

## 2014-05-13 DIAGNOSIS — R634 Abnormal weight loss: Secondary | ICD-10-CM

## 2014-05-13 DIAGNOSIS — R109 Unspecified abdominal pain: Secondary | ICD-10-CM

## 2014-05-13 DIAGNOSIS — C786 Secondary malignant neoplasm of retroperitoneum and peritoneum: Secondary | ICD-10-CM

## 2014-05-13 DIAGNOSIS — C160 Malignant neoplasm of cardia: Secondary | ICD-10-CM

## 2014-05-13 DIAGNOSIS — Z95828 Presence of other vascular implants and grafts: Secondary | ICD-10-CM

## 2014-05-13 DIAGNOSIS — R1310 Dysphagia, unspecified: Secondary | ICD-10-CM

## 2014-05-13 DIAGNOSIS — D6481 Anemia due to antineoplastic chemotherapy: Secondary | ICD-10-CM

## 2014-05-13 DIAGNOSIS — C787 Secondary malignant neoplasm of liver and intrahepatic bile duct: Secondary | ICD-10-CM

## 2014-05-13 DIAGNOSIS — C159 Malignant neoplasm of esophagus, unspecified: Secondary | ICD-10-CM

## 2014-05-13 DIAGNOSIS — N189 Chronic kidney disease, unspecified: Secondary | ICD-10-CM

## 2014-05-13 DIAGNOSIS — D631 Anemia in chronic kidney disease: Secondary | ICD-10-CM

## 2014-05-13 LAB — CBC WITH DIFFERENTIAL/PLATELET
BASO%: 0.7 % (ref 0.0–2.0)
Basophils Absolute: 0 10*3/uL (ref 0.0–0.1)
EOS%: 1.2 % (ref 0.0–7.0)
Eosinophils Absolute: 0.1 10*3/uL (ref 0.0–0.5)
HEMATOCRIT: 34 % — AB (ref 38.4–49.9)
HGB: 10.8 g/dL — ABNORMAL LOW (ref 13.0–17.1)
LYMPH%: 12.1 % — ABNORMAL LOW (ref 14.0–49.0)
MCH: 28.4 pg (ref 27.2–33.4)
MCHC: 31.7 g/dL — AB (ref 32.0–36.0)
MCV: 89.5 fL (ref 79.3–98.0)
MONO#: 0.3 10*3/uL (ref 0.1–0.9)
MONO%: 5.3 % (ref 0.0–14.0)
NEUT#: 4.7 10*3/uL (ref 1.5–6.5)
NEUT%: 80.7 % — ABNORMAL HIGH (ref 39.0–75.0)
Platelets: 102 10*3/uL — ABNORMAL LOW (ref 140–400)
RBC: 3.8 10*6/uL — AB (ref 4.20–5.82)
RDW: 17.7 % — ABNORMAL HIGH (ref 11.0–14.6)
WBC: 5.8 10*3/uL (ref 4.0–10.3)
lymph#: 0.7 10*3/uL — ABNORMAL LOW (ref 0.9–3.3)

## 2014-05-13 LAB — COMPREHENSIVE METABOLIC PANEL (CC13)
ALK PHOS: 78 U/L (ref 40–150)
ALT: 7 U/L (ref 0–55)
AST: 11 U/L (ref 5–34)
Albumin: 3 g/dL — ABNORMAL LOW (ref 3.5–5.0)
Anion Gap: 6 mEq/L (ref 3–11)
BILIRUBIN TOTAL: 0.55 mg/dL (ref 0.20–1.20)
BUN: 36.3 mg/dL — ABNORMAL HIGH (ref 7.0–26.0)
CO2: 25 mEq/L (ref 22–29)
Calcium: 8.1 mg/dL — ABNORMAL LOW (ref 8.4–10.4)
Chloride: 105 mEq/L (ref 98–109)
Creatinine: 1.2 mg/dL (ref 0.7–1.3)
EGFR: 54 mL/min/{1.73_m2} — ABNORMAL LOW (ref >90)
Glucose: 218 mg/dl — ABNORMAL HIGH (ref 70–140)
Potassium: 4.4 mEq/L (ref 3.5–5.1)
Sodium: 136 mEq/L (ref 136–145)
Total Protein: 5.2 g/dL — ABNORMAL LOW (ref 6.4–8.3)

## 2014-05-13 LAB — HOLD TUBE, BLOOD BANK

## 2014-05-13 MED ORDER — SODIUM CHLORIDE 0.9 % IJ SOLN
10.0000 mL | INTRAMUSCULAR | Status: DC | PRN
  Administered 2014-05-13 (×2): 10 mL via INTRAVENOUS
  Filled 2014-05-13: qty 10

## 2014-05-13 MED ORDER — HYDROCODONE-ACETAMINOPHEN 5-325 MG PO TABS: 1.0000 | ORAL_TABLET | Freq: Four times a day (QID) | ORAL | Status: DC | PRN

## 2014-05-13 MED ORDER — HEPARIN SOD (PORK) LOCK FLUSH 100 UNIT/ML IV SOLN
500.0000 [IU] | Freq: Once | INTRAVENOUS | Status: AC
  Administered 2014-05-13: 500 [IU] via INTRAVENOUS
  Filled 2014-05-13: qty 5

## 2014-05-13 NOTE — Telephone Encounter (Signed)
gv adn printed appt sched anda vs for pt for Feb °

## 2014-05-13 NOTE — Patient Instructions (Signed)

## 2014-05-13 NOTE — Progress Notes (Addendum)
[facility] OFFICE PROGRESS NOTE   Diagnosis:  GE junction adenocarcinoma  INTERVAL HISTORY:   [name] returns for follow-up. He completed 1 cycle of 5-FU/leucovorin/etoposide beginning 03/11/2014. Cycle 2 was held due to a poor performance status. He canceled several follow-up visits. His appetite varies. He has lost a significant amount of weight since his last visit. He denies dysphagia. He has intermittent abdominal pain which at times seems to be related to food intake. The pain does not occur every day or with every meal. He takes hydrocodone if needed. He is confused at times.  Objective:  Vital signs in last 24 hours:  Blood pressure 154/46, pulse 62, temperature 97.5 F (36.4 C), temperature source Oral, resp. rate 18, height $RemoveBe'5\' 11"'fYPuRoMBq$  (1.803 m), weight 142 lb 8 oz (64.638 kg), SpO2 100 %.    HEENT: No thrush or ulcers. Resp: Lungs clear bilaterally. Cardio: Regular rate and rhythm. GI: Abdomen soft and nontender. No hepatomegaly. No mass. Vascular: No leg edema. Neuro: Alert and oriented.  Skin: Ecchymoses over forearms.  Port-A-Cath without erythema.  Lab Results:  Lab Results  Component Value Date   WBC 5.8 05/13/2014   HGB 10.8* 05/13/2014   HCT 34.0* 05/13/2014   MCV 89.5 05/13/2014   PLT 102* 05/13/2014   NEUTROABS 4.7 05/13/2014    Imaging:  No results found.  Medications: I have reviewed the patient's current medications.  Assessment/Plan: 1. GE junction adenocarcinoma  EGD 01/31/2014 with an ulcerated circumferential 2-3 cm mass at the GE junction all on the esophageal side causing incomplete stricture.   Biopsy showed invasive poorly differentiated adenocarcinoma; no amplification of HER-2.   CT abdomen/pelvis 01/30/2014 showed new hypodensities in the liver concerning for metastatic disease.  PET scan 03/04/2014 confirmed a hypermetabolic GE junction mass, numerous hypermetabolic liver metastases, and upper  abdominal/retroperitoneal nodal metastases  Cycle 1 5-FU/leucovorin/etoposide11/08/2013 with Neulasta support 2. Dysphagia secondary to #1 3. Diabetes mellitus 4. Chronic kidney disease 5. Peripheral neuropathy related to diabetes 6. Peripheral arterial disease 7. Hypertension-Markedly elevated blood pressure on hospital admission 8. Hyperlipidemia 9. Admission 03/17/2014 with an altered mental status-improved 10. Pancytopenia-baseline anemia/mild pancytopenia, complicated by chemotherapy. 11. Left lower leg wound in the setting of peripheral vascular disease and diabetes. 12. Anemia, multifactorial secondary to chemotherapy, renal failure, question blood loss related to the primary tumor.   Disposition: [name] has completed 1 cycle of chemotherapy. The chemotherapy was given approximately 2 months ago.[name] He has missed multiple follow-up visits. He has a poor performance status. He has lost a significant amount of weight over the past month. He does not appear to be a candidate for additional chemotherapy. He and his daughter are in agreement and would like to focus on his comfort. Dr. Benay Spice recommends a [facility] referral. Per his wishes he will be placed on NO CODE BLUE status.   He was given a prescription for hydrocodone 1-2 tablets every 6 hours as needed for the intermittent abdominal pain. We recommended he begin Prilosec in case there is a component of reflux. He will contact the office with poorly controlled pain.  He will return for a follow-up visit in 4-5 weeks.  Patient seen with Dr. Benay Spice. 30 minutes were spent face-to-face at today's visit with the majority of that time involved in counseling/coordination of care.  Ned Card ANP/GNP-BC   05/13/2014  10:18 AM   This was ensured visit with Lattie Haw,. He completed one cycle of systemic chemotherapy 2 months ago. I discussed treatment options  with [name] and his daughter. We discussed continuing  chemotherapy versus a supportive care approach. He prefers supportive care. He agrees to a [facility] referral. He will be placed on a No CODE BLUE status.  Julieanne Manson, M.D.

## 2014-05-14 ENCOUNTER — Ambulatory Visit: Payer: Medicare Other

## 2014-05-14 ENCOUNTER — Telehealth: Payer: Self-pay

## 2014-05-14 NOTE — Telephone Encounter (Signed)
Faxed referral to hospice [facility].

## 2014-05-15 ENCOUNTER — Ambulatory Visit (INDEPENDENT_AMBULATORY_CARE_PROVIDER_SITE_OTHER): Payer: Medicare Other | Admitting: Cardiovascular Disease

## 2014-05-15 ENCOUNTER — Ambulatory Visit: Payer: Medicare Other

## 2014-05-15 ENCOUNTER — Encounter: Payer: Self-pay | Admitting: Cardiovascular Disease

## 2014-05-15 VITALS — BP 126/40 | HR 43 | Ht 70.0 in | Wt 144.0 lb

## 2014-05-15 DIAGNOSIS — I739 Peripheral vascular disease, unspecified: Secondary | ICD-10-CM

## 2014-05-15 NOTE — Progress Notes (Signed)
Background: The patient is followed for malignant hypertension, extensive PAD, history of stroke, diabetes with peripheral neuropathy, and hyperlipidemia.the patient has undergone complex endovascular treatments for his PAD. He has severely calcified vessels and has had markedly elevated blood pressure been uncontrollable over time.  HPI:  81 year-old male presenting for follow-up evaluation. Since his last visit with me, he has been diagnosed with esophageal cancer. He was treated with 1 cycle of chemotherapy but did not tolerate it. He has decided for no further treatment. He's been referred to hospice but has not contacted them yet. The patient has lost over 25 pounds. He's had no chest pain, chest pressure, or shortness of breath.he's had mild lightheadedness with postural changes but no syncope. He has not had recent problems with leg pain, but admits that he's been more sedentary.   Outpatient Encounter Prescriptions as of 05/15/2014  Medication Sig  . amLODipine (NORVASC) 10 MG tablet Take 1 tablet (10 mg total) by mouth daily.  . cloNIDine (CATAPRES) 0.3 MG tablet Take 0.6 mg by mouth 2 (two) times daily.  . feeding supplement, GLUCERNA SHAKE, (GLUCERNA SHAKE) LIQD Take 237 mLs by mouth 3 (three) times daily between meals.  . furosemide (LASIX) 20 MG tablet Take 40 mg (2 tabs) PO Q AM for next 2 days; then return to taking 20 mg (1 tab) PO Q AM.  . hydrALAZINE (APRESOLINE) 50 MG tablet Take 1.5 tablets (75 mg total) by mouth 3 (three) times daily.  [name] HYDROcodone-acetaminophen (NORCO/VICODIN) 5-325 MG per tablet Take 1-2 tablets by mouth every 6 (six) hours as needed for moderate pain or severe pain.  . hydrocodone-ibuprofen (VICOPROFEN) 5-200 MG per tablet Take 1 tablet by mouth every 8 (eight) hours as needed for pain.  [name] insulin aspart (NOVOLOG FLEXPEN) 100 UNIT/ML FlexPen Use sliding scale as follows   CBG 121 - 150: 2 units    CBG 151 - 200: 3 units    CBG 201 - 250: 5  units    CBG 251 - 300: 8 units    CBG 301 - 350: 11 units    CBG 351 - 400: 15 units  . levofloxacin (LEVAQUIN) 750 MG tablet Take 1 tablet (750 mg total) by mouth daily.  [name] lidocaine-prilocaine (EMLA) cream Apply 1 application topically as needed. Do not rub in. Cover with plastic.  . polyethylene glycol powder (GLYCOLAX/MIRALAX) powder Mix 17 Grams daily.  Mix powder with 8 oz beverage of choice and drink  . potassium chloride SA (K-DUR,KLOR-CON) 20 MEQ tablet Take 1 tablet (20 mEq total) by mouth daily.  . pregabalin (LYRICA) 75 MG capsule Take 75 mg by mouth 3 (three) times daily.   [name] PRESCRIPTION MEDICATION Chemo [facility]  . senna-docusate (SENOKOT-S) 8.6-50 MG per tablet Take 1 tablet by mouth 2 (two) times daily.  . sorbitol 70 % SOLN Take 30 mLs by mouth daily.  [name] spironolactone (ALDACTONE) 25 MG tablet Take 1 tablet (25 mg total) by mouth 2 (two) times daily.  . traMADol (ULTRAM) 50 MG tablet Take 1 tablet (50 mg total) by mouth every 12 (twelve) hours as needed for moderate pain.  . [DISCONTINUED] prochlorperazine (COMPAZINE) 5 MG tablet Take 1 tablet (5 mg total) by mouth every 6 (six) hours as needed for nausea or vomiting.    Allergies  Allergen Reactions  . Contrast Media [Iodinated Diagnostic Agents] Other (See Comments)    Reaction:  History of renal failure following contrast requiring temporary dialysis.    . Metformin Hcl  Other (See Comments)    Reaction:  Renal failure----Pt states that "metformin will kill me".  . Aliskiren Rash  . Doxazosin Mesylate Rash    Past Medical History  Diagnosis Date  . Carotid stenosis     u/s 5/11: R 0-39% L 40-59% (stable)  . HTN (hypertension)     refractory. unable to cardura in past due to hypotension  . Edema   . PAD (peripheral artery disease)   . Cerebrovascular disease, unspecified   . HLD (hyperlipidemia)   . Bradycardia   . Diabetes mellitus type II   . Hyperkalemia   . Shortness of breath   . Arthritis     " IN  MY HANDS "  . Chronic kidney disease, stage III (moderate)   . Stroke   . Cancer     esophageal.   . History of recent fall 03/27/14    [facility] in [place] called    family history includes Coronary artery disease in his father.   ROS: Negative except as per HPI  Pulse 43  Ht 5\' 10"  (1.778 m)  Wt 144 lb (65.318 kg)  BMI 20.66 kg/m2  PHYSICAL EXAM: Pt is alert and oriented, thin male inNAD HEENT: normal Neck: JVP - normal, carotids 2+= with bilateral bruits Lungs: CTA bilaterally [id number] and regular without murmur or gallop Abd: soft, NT, Positive BS, no hepatomegaly Ext: no C/C/E Skin: warm/dry no rash  ASSESSMENT AND PLAN: 1. Lower extremity peripheral arterial disease. The patient has undergone complex endovascular treatments in the past. Any further treatments would be reserved for critical limb ischemia as he is moving towards a more palliative approach to his medical care in the setting of esophageal cancer with metastases.  2. Malignant hypertension. Would continue current medications. As he continues to lose weight, may need to taper off of some of his antihypertensives, but for now appears stable.  For follow-up I will see him back in 6 months.  Sherren Mocha, MD 05/15/2014 9:23 AM

## 2014-05-15 NOTE — Patient Instructions (Signed)
Your physician wants you to follow-up in: 6 months with Dr. Cooper.  You will receive a reminder letter in the mail two months in advance. If you don't receive a letter, please call our office to schedule the follow-up appointment.  Your physician recommends that you continue on your current medications as directed. Please refer to the Current Medication list given to you today.  

## 2014-05-16 ENCOUNTER — Ambulatory Visit: Payer: Medicare Other

## 2014-05-18 ENCOUNTER — Ambulatory Visit: Payer: Medicare Other

## 2014-05-18 ENCOUNTER — Telehealth: Payer: Self-pay | Admitting: *Deleted

## 2014-05-18 NOTE — Telephone Encounter (Signed)
Received message from Nickolas Madrid, [place]; reports Hospice went out to see pt last week and pt stated he is "not ready" for services yet/ declines at this time.  Note to Dr. Benay Spice.

## 2014-05-23 ENCOUNTER — Encounter: Payer: Self-pay | Admitting: Oncology

## 2014-05-25 ENCOUNTER — Emergency Department (HOSPITAL_COMMUNITY)
Admission: EM | Admit: 2014-05-25 | Discharge: 2014-05-25 | Disposition: A | Discharge status: Home / Self Care | Payer: Medicare Other | Attending: Emergency Medicine | Admitting: Emergency Medicine

## 2014-05-25 ENCOUNTER — Encounter (HOSPITAL_COMMUNITY): Payer: Self-pay | Admitting: Emergency Medicine

## 2014-05-25 DIAGNOSIS — Z79899 Other long term (current) drug therapy: Secondary | ICD-10-CM | POA: Diagnosis not present

## 2014-05-25 DIAGNOSIS — E119 Type 2 diabetes mellitus without complications: Secondary | ICD-10-CM | POA: Diagnosis not present

## 2014-05-25 DIAGNOSIS — I739 Peripheral vascular disease, unspecified: Secondary | ICD-10-CM | POA: Insufficient documentation

## 2014-05-25 DIAGNOSIS — Z85030 Personal history of malignant carcinoid tumor of large intestine: Secondary | ICD-10-CM | POA: Insufficient documentation

## 2014-05-25 DIAGNOSIS — Z794 Long term (current) use of insulin: Secondary | ICD-10-CM | POA: Insufficient documentation

## 2014-05-25 DIAGNOSIS — Y9389 Activity, other specified: Secondary | ICD-10-CM | POA: Insufficient documentation

## 2014-05-25 DIAGNOSIS — Z042 Encounter for examination and observation following work accident: Secondary | ICD-10-CM | POA: Diagnosis present

## 2014-05-25 DIAGNOSIS — W07XXXA Fall from chair, initial encounter: Secondary | ICD-10-CM | POA: Diagnosis not present

## 2014-05-25 DIAGNOSIS — N182 Chronic kidney disease, stage 2 (mild): Secondary | ICD-10-CM | POA: Insufficient documentation

## 2014-05-25 DIAGNOSIS — Y9289 Other specified places as the place of occurrence of the external cause: Secondary | ICD-10-CM | POA: Diagnosis not present

## 2014-05-25 DIAGNOSIS — R000 Tachycardia, unspecified: Secondary | ICD-10-CM | POA: Insufficient documentation

## 2014-05-25 DIAGNOSIS — Z9221 Personal history of antineoplastic chemotherapy: Secondary | ICD-10-CM | POA: Diagnosis not present

## 2014-05-25 DIAGNOSIS — Z8673 Personal history of transient ischemic attack (TIA), and cerebral infarction without residual deficits: Secondary | ICD-10-CM | POA: Diagnosis not present

## 2014-05-25 DIAGNOSIS — Z87891 Personal history of nicotine dependence: Secondary | ICD-10-CM | POA: Diagnosis not present

## 2014-05-25 DIAGNOSIS — Z9889 Other specified postprocedural states: Secondary | ICD-10-CM | POA: Diagnosis not present

## 2014-05-25 DIAGNOSIS — I129 Hypertensive chronic kidney disease with stage 1 through stage 4 chronic kidney disease, or unspecified chronic kidney disease: Secondary | ICD-10-CM | POA: Insufficient documentation

## 2014-05-25 DIAGNOSIS — Y998 Other external cause status: Secondary | ICD-10-CM | POA: Insufficient documentation

## 2014-05-25 DIAGNOSIS — E875 Hyperkalemia: Secondary | ICD-10-CM | POA: Insufficient documentation

## 2014-05-25 DIAGNOSIS — M1990 Unspecified osteoarthritis, unspecified site: Secondary | ICD-10-CM | POA: Insufficient documentation

## 2014-05-25 DIAGNOSIS — W19XXXA Unspecified fall, initial encounter: Secondary | ICD-10-CM

## 2014-05-25 DIAGNOSIS — Z9181 History of falling: Secondary | ICD-10-CM | POA: Insufficient documentation

## 2014-05-25 LAB — URINALYSIS, ROUTINE W REFLEX MICROSCOPIC
Bilirubin Urine: NEGATIVE
Glucose, UA: NEGATIVE mg/dL
HGB URINE DIPSTICK: NEGATIVE
KETONES UR: 15 mg/dL — AB
Leukocytes, UA: NEGATIVE
Nitrite: NEGATIVE
Protein, ur: 100 mg/dL — AB
SPECIFIC GRAVITY, URINE: 1.017 (ref 1.005–1.030)
Urobilinogen, UA: 1 mg/dL (ref 0.0–1.0)
pH: 5.5 (ref 5.0–8.0)

## 2014-05-25 LAB — URINE MICROSCOPIC-ADD ON

## 2014-05-25 LAB — CBC WITH DIFFERENTIAL/PLATELET
BASOS PCT: 0 % (ref 0–1)
Basophils Absolute: 0 10*3/uL (ref 0.0–0.1)
EOS ABS: 0 10*3/uL (ref 0.0–0.7)
EOS PCT: 0 % (ref 0–5)
HEMATOCRIT: 41 % (ref 39.0–52.0)
HEMOGLOBIN: 14 g/dL (ref 13.0–17.0)
LYMPHS ABS: 1.3 10*3/uL (ref 0.7–4.0)
Lymphocytes Relative: 12 % (ref 12–46)
MCH: 29.3 pg (ref 26.0–34.0)
MCHC: 34.1 g/dL (ref 30.0–36.0)
MCV: 85.8 fL (ref 78.0–100.0)
MONOS PCT: 4 % (ref 3–12)
Monocytes Absolute: 0.5 10*3/uL (ref 0.1–1.0)
NEUTROS ABS: 8.7 10*3/uL — AB (ref 1.7–7.7)
Neutrophils Relative %: 84 % — ABNORMAL HIGH (ref 43–77)
Platelets: 164 10*3/uL (ref 150–400)
RBC: 4.78 MIL/uL (ref 4.22–5.81)
RDW: 16.3 % — ABNORMAL HIGH (ref 11.5–15.5)
WBC: 10.5 10*3/uL (ref 4.0–10.5)

## 2014-05-25 LAB — BASIC METABOLIC PANEL
Anion gap: 16 — ABNORMAL HIGH (ref 5–15)
BUN: 23 mg/dL (ref 6–23)
CHLORIDE: 105 meq/L (ref 96–112)
CO2: 18 mmol/L — ABNORMAL LOW (ref 19–32)
Calcium: 9.5 mg/dL (ref 8.4–10.5)
Creatinine, Ser: 1.68 mg/dL — ABNORMAL HIGH (ref 0.50–1.35)
GFR calc Af Amer: 42 mL/min — ABNORMAL LOW (ref >90)
GFR calc non Af Amer: 36 mL/min — ABNORMAL LOW (ref >90)
GLUCOSE: 204 mg/dL — AB (ref 70–99)
POTASSIUM: 4.8 mmol/L (ref 3.5–5.1)
Sodium: 139 mmol/L (ref 135–145)

## 2014-05-25 MED ORDER — SODIUM CHLORIDE 0.9 % IV BOLUS (SEPSIS)
1000.0000 mL | Freq: Once | INTRAVENOUS | Status: AC
  Administered 2014-05-25: 1000 mL via INTRAVENOUS

## 2014-05-25 MED ORDER — SODIUM CHLORIDE 0.9 % IV BOLUS (SEPSIS)
500.0000 mL | Freq: Once | INTRAVENOUS | Status: AC
  Administered 2014-05-25: 500 mL via INTRAVENOUS

## 2014-05-25 NOTE — ED Notes (Signed)
Helped patient use urinal; patient stated he could not because he was too cold and urge to urinate left him.

## 2014-05-25 NOTE — ED Notes (Signed)
The tech ambulated the patient approximately 10 ft and the patients gait is unsteady. The tech reported to the RN in charge.

## 2014-05-25 NOTE — ED Notes (Signed)
Per EMS patient fell at home today. Patient started chemo therapy 2 weeks ago. Today was standing without cain and fell over coffee table. Patient is agitated.

## 2014-05-25 NOTE — ED Notes (Signed)
Called for cab- [employer]

## 2014-05-25 NOTE — ED Provider Notes (Signed)
CSN: [id number]     Arrival date & time 05/25/14  1521 History   First MD Initiated Contact with Patient 05/25/14 1522     Chief Complaint  Patient presents with  . Fall      HPI  Patient presents for evaluation after a fall.  He states he stood up out of his chair. He started to go forward, and he fell forward. He tried to catch himself reaching for a table. He caught his right arm on the edge of the table and it flipped him over and landed on his back.  He denies any injury. He denies feeling lightheaded or orthostatic or syncopal or near syncopal. Denies headache head injury or loss of consciousness.  Only recalled paramedics. He arrives in cervical collar and K ED. He has no complaints of pain. Last medics lightest place in the collarKED and they state "ease of transport".   Pt with significant PAD, Htn, and recent GE junction Cancer.  Patient did not tolerate one round of chemotherapy and has declined any additional therapy. He states it has become a little more difficult to eat and drink at home but he is managing. He states he still has bowel movements and urinates daily. No recent cough sputum brush difficult breathing fevers chills chest pain headaches or other complaints.   Past Medical History  Diagnosis Date  . Carotid stenosis     u/s 5/11: R 0-39% L 40-59% (stable)  . HTN (hypertension)     refractory. unable to cardura in past due to hypotension  . Edema   . PAD (peripheral artery disease)   . Cerebrovascular disease, unspecified   . HLD (hyperlipidemia)   . Bradycardia   . Diabetes mellitus type II   . Hyperkalemia   . Shortness of breath   . Arthritis     " IN MY HANDS "  . Chronic kidney disease, stage III (moderate)   . Stroke   . Cancer     esophageal.   . History of recent fall 03/27/14    [facility] [place] called   Past Surgical History  Procedure Laterality Date  . Carotid endarterectomy  1/01    right  . Balloon angioplasty, artery  Bilateral 04/09/2013    LEFT ILIAC & RT COMMON ILIAC     DR ARIDA    . Femoral artery stent  ?   2010    DR COOPER  . Esophagogastroduodenoscopy N/A 01/31/2014    Procedure: ESOPHAGOGASTRODUODENOSCOPY (EGD);  Surgeon: Milus Banister, MD;  Location: [facility];  Service: Endoscopy;  Laterality: N/A;  . Abdominal aortagram N/A 04/09/2013    Procedure: ABDOMINAL AORTAGRAM;  Surgeon: Wellington Hampshire, MD;  Location: [facility] CATH LAB;  Service: Cardiovascular;  Laterality: N/A;  . Lower extremity angiogram Bilateral 04/10/2013    Procedure: [facility] STENT;  Surgeon: Wellington Hampshire, MD;  Location: [facility] CATH LAB;  Service: Cardiovascular;  Laterality: Bilateral;  . Abdominal aortagram N/A 05/21/2013    Procedure: ABDOMINAL Maxcine Ham;  Surgeon: Wellington Hampshire, MD;  Location: [facility] CATH LAB;  Service: Cardiovascular;  Laterality: N/A;   Family History  Problem Relation Age of Onset  . Coronary artery disease Father    History  Substance Use Topics  . Smoking status: Former Smoker    Quit date: 05/08/1994  . Smokeless tobacco: Never Used  . Alcohol Use: No    Review of Systems  Constitutional: Negative for fever, chills, diaphoresis, appetite change and fatigue.  HENT: Negative for  mouth sores, sore throat and trouble swallowing.   Eyes: Negative for visual disturbance.  Respiratory: Negative for cough, chest tightness, shortness of breath and wheezing.   Cardiovascular: Negative for chest pain.  Gastrointestinal: Negative for nausea, vomiting, abdominal pain, diarrhea and abdominal distention.  Endocrine: Negative for polydipsia, polyphagia and polyuria.  Genitourinary: Negative for dysuria, frequency and hematuria.  Musculoskeletal: Negative for gait problem.  Skin: Negative for color change, pallor and rash.  Neurological: Negative for dizziness, syncope, light-headedness and headaches.  Hematological: Does not bruise/bleed easily.  Psychiatric/Behavioral: Negative for behavioral problems and  confusion.      Allergies  Contrast media; Metformin hcl; Aliskiren; and Doxazosin mesylate  Home Medications   Prior to Admission medications   Medication Sig Start Date End Date Taking? Authorizing Provider  amLODipine (NORVASC) 10 MG tablet Take 1 tablet (10 mg total) by mouth daily. 10/15/12   Amy D Clegg, NP  cloNIDine (CATAPRES) 0.3 MG tablet Take 0.6 mg by mouth 2 (two) times daily.    Historical Provider, MD  feeding supplement, GLUCERNA SHAKE, (GLUCERNA SHAKE) LIQD Take 237 mLs by mouth 3 (three) times daily between meals. 03/25/14   Robbie Lis, MD  furosemide (LASIX) 20 MG tablet Take 40 mg (2 tabs) PO Q AM for next 2 days; then return to taking 20 mg (1 tab) PO Q AM. 03/27/14   Drue Second, NP  hydrALAZINE (APRESOLINE) 50 MG tablet Take 1.5 tablets (75 mg total) by mouth 3 (three) times daily. 08/15/13 09/19/15  Larey Dresser, MD  HYDROcodone-acetaminophen (NORCO/VICODIN) 5-325 MG per tablet Take 1-2 tablets by mouth every 6 (six) hours as needed for moderate pain or severe pain. 05/13/14   Owens Shark, NP  hydrocodone-ibuprofen (VICOPROFEN) 5-200 MG per tablet Take 1 tablet by mouth every 8 (eight) hours as needed for pain. 03/25/14   Robbie Lis, MD  insulin aspart (NOVOLOG FLEXPEN) 100 UNIT/ML FlexPen Use sliding scale as follows   CBG 121 - 150: 2 units    CBG 151 - 200: 3 units    CBG 201 - 250: 5 units    CBG 251 - 300: 8 units    CBG 301 - 350: 11 units    CBG 351 - 400: 15 units 03/25/14   Robbie Lis, MD  levofloxacin (LEVAQUIN) 750 MG tablet Take 1 tablet (750 mg total) by mouth daily. 03/25/14   Robbie Lis, MD  lidocaine-prilocaine (EMLA) cream Apply 1 application topically as needed. Do not rub in. Cover with plastic. 03/11/14   Ladell Pier, MD  polyethylene glycol powder (GLYCOLAX/MIRALAX) powder Mix 17 Grams daily.  Mix powder with 8 oz beverage of choice and drink 04/03/14   Ladell Pier, MD  potassium chloride SA (K-DUR,KLOR-CON) 20  MEQ tablet Take 1 tablet (20 mEq total) by mouth daily. 03/27/14   Drue Second, NP  pregabalin (LYRICA) 75 MG capsule Take 75 mg by mouth 3 (three) times daily.     Historical Provider, MD  [place]    Historical Provider, MD  senna-docusate (SENOKOT-S) 8.6-50 MG per tablet Take 1 tablet by mouth 2 (two) times daily.    Historical Provider, MD  sorbitol 70 % SOLN Take 30 mLs by mouth daily. 04/03/14   Ladell Pier, MD  spironolactone (ALDACTONE) 25 MG tablet Take 1 tablet (25 mg total) by mouth 2 (two) times daily. 03/25/14   Robbie Lis, MD  traMADol (ULTRAM) 50 MG tablet Take 1 tablet (  50 mg total) by mouth every 12 (twelve) hours as needed for moderate pain. 02/01/14   Sheela Stack, MD   BP 136/79 mmHg  Pulse 103  Resp 22  SpO2 100% Physical Exam  Constitutional: He is oriented to person, place, and time. He appears well-developed and well-nourished. No distress.  Thin adult male. Awake alert. No acute distress. Heart appearing.  HENT:  Head: Normocephalic.  Eyes: Conjunctivae are normal. Pupils are equal, round, and reactive to light. No scleral icterus.  Neck: Normal range of motion. Neck supple. No thyromegaly present.  Cardiovascular: Normal rate and regular rhythm.  Exam reveals no gallop and no friction rub.   No murmur heard. Sinus tachycardia on the monitor. Rate 110.  Pulmonary/Chest: Effort normal and breath sounds normal. No respiratory distress. He has no wheezes. He has no rales.  Port in the right chest. Clear lungs.  Abdominal: Soft. Bowel sounds are normal. He exhibits no distension. There is no tenderness. There is no rebound.  Musculoskeletal: Normal range of motion.  Neurological: He is alert and oriented to person, place, and time.  Skin: Skin is warm and dry. No rash noted.  Psychiatric: He has a normal mood and affect. His behavior is normal.    ED Course  Procedures (including critical care time) Labs Review Labs  Reviewed  CBC WITH DIFFERENTIAL - Abnormal; Notable for the following:    RDW 16.3 (*)    Neutrophils Relative % 84 (*)    Neutro Abs 8.7 (*)    All other components within normal limits  BASIC METABOLIC PANEL - Abnormal; Notable for the following:    CO2 18 (*)    Glucose, Bld 204 (*)    Creatinine, Ser 1.68 (*)    GFR calc non Af Amer 36 (*)    GFR calc Af Amer 42 (*)    Anion gap 16 (*)    All other components within normal limits  URINALYSIS, ROUTINE W REFLEX MICROSCOPIC - Abnormal; Notable for the following:    Ketones, ur 15 (*)    Protein, ur 100 (*)    All other components within normal limits  URINE MICROSCOPIC-ADD ON - Abnormal; Notable for the following:    Bacteria, UA FEW (*)    Casts HYALINE CASTS (*)    All other components within normal limits    Imaging Review No results found.   EKG Interpretation   Date/Time:  Monday May 25 2014 16:07:08 EST Ventricular Rate:  112 PR Interval:    QRS Duration: 142 QT Interval:  373 QTC Calculation: 509 R Axis:   -67 Text Interpretation:  Junctional tachycardia vs SR with !st degree AVB  RBBB and LAFB Confirmed by Jeneen Rinks  MD, [place] ([id number]) on 05/25/2014 4:16:29 PM      MDM   Final diagnoses:  Fall, initial encounter    Patient initially in a tachycardic rhythm. Either junctional or sinus with first-degree AV block. With IV fluids his heart rate has improved. His rhythm clearly shows sinus now. Rate 90. He has urinated. He is asymptomatic. Crit is slightly high at 1.6. CO2 of 18. Didn't see 10.5 hemoglobin 14.0. Urine 1.017. No signs of infection. I think is appropriate for continued outpatient follow-up.    Tanna Furry, MD 05/25/14 [id number]

## 2014-05-25 NOTE — Discharge Instructions (Signed)
Eat and drink as best you can.  It is most important to stay hydrated.  Recheck with your physician as needed.

## 2014-05-26 ENCOUNTER — Telehealth: Payer: Self-pay | Admitting: *Deleted

## 2014-05-26 ENCOUNTER — Other Ambulatory Visit: Payer: Self-pay | Admitting: *Deleted

## 2014-05-26 DIAGNOSIS — C159 Malignant neoplasm of esophagus, unspecified: Secondary | ICD-10-CM

## 2014-05-26 MED ORDER — MEGESTROL ACETATE 40 MG/ML PO SUSP: 200.0000 mg | Freq: Two times a day (BID) | ORAL | Status: DC

## 2014-05-26 NOTE — Telephone Encounter (Signed)
[name] had called on 1/18 to follow up on status of forms that were faxed to [phone] on 05/14/14 for signature. Unable to locate forms. Called back and left her voice mail to please re-fax forms to [phone] for MD to sign.

## 2014-05-26 NOTE — Telephone Encounter (Signed)
Notified patient that Dr. Benay Spice has approved Megace for him to try. Take 5 cc twice daily. He agrees to try this. Confirmed with [facility] that referral was received on 05/14/14 and he was visited on 05/18/14 and decline services; "not ready yet for hospice".

## 2014-06-02 ENCOUNTER — Telehealth: Payer: Self-pay | Admitting: Oncology

## 2014-06-02 NOTE — Telephone Encounter (Signed)
Lft msg for pt confirming MD visit moved up an hour mailed sch to pt... KJ

## 2014-06-04 ENCOUNTER — Telehealth: Payer: Self-pay | Admitting: *Deleted

## 2014-06-04 ENCOUNTER — Encounter (INDEPENDENT_AMBULATORY_CARE_PROVIDER_SITE_OTHER): Payer: Medicare Other | Admitting: Ophthalmology

## 2014-06-04 NOTE — Telephone Encounter (Signed)
Juliann Pulse RN called reporting the wife has called requesting Hospice services.  A new order is needed.  Verbal order received and read back from Dr. Benay Spice to be Hospice attending and symptom management.  Juliann Pulse given these orders.

## 2014-06-05 ENCOUNTER — Encounter (INDEPENDENT_AMBULATORY_CARE_PROVIDER_SITE_OTHER): Payer: Medicare Other | Admitting: Ophthalmology

## 2014-06-05 ENCOUNTER — Telehealth: Payer: Self-pay | Admitting: *Deleted

## 2014-06-05 NOTE — Telephone Encounter (Signed)
Call from Newman Nickels with hospice reporting pt has declined services again. Wife contacted them due to functional decline. He has had a few falls and is losing weight. Will address with pt during visit.

## 2014-06-12 NOTE — Telephone Encounter (Signed)
Letter went out, called again to confirm apt still no answer left another msg confirming MD visit .... Cherylann Banas

## 2014-06-15 ENCOUNTER — Telehealth: Payer: Self-pay | Admitting: *Deleted

## 2014-06-15 ENCOUNTER — Ambulatory Visit: Payer: Medicare Other | Admitting: Oncology

## 2014-06-15 NOTE — Telephone Encounter (Signed)
Called pt's wife to follow up on missed appt today. She reports pt has gone "out for a hot dog." She states today is a good day for him. [name] requests to reschedule missed appt. Asks to be called on 2/12 when her daughter is back in town, to schedule.

## 2014-06-19 ENCOUNTER — Encounter (INDEPENDENT_AMBULATORY_CARE_PROVIDER_SITE_OTHER): Payer: Medicare Other | Admitting: Ophthalmology

## 2014-06-19 DIAGNOSIS — E11319 Type 2 diabetes mellitus with unspecified diabetic retinopathy without macular edema: Secondary | ICD-10-CM | POA: Diagnosis not present

## 2014-06-19 DIAGNOSIS — H35033 Hypertensive retinopathy, bilateral: Secondary | ICD-10-CM

## 2014-06-19 DIAGNOSIS — H43813 Vitreous degeneration, bilateral: Secondary | ICD-10-CM

## 2014-06-19 DIAGNOSIS — I10 Essential (primary) hypertension: Secondary | ICD-10-CM

## 2014-06-19 DIAGNOSIS — E11329 Type 2 diabetes mellitus with mild nonproliferative diabetic retinopathy without macular edema: Secondary | ICD-10-CM | POA: Diagnosis not present

## 2014-06-19 DIAGNOSIS — H3532 Exudative age-related macular degeneration: Secondary | ICD-10-CM | POA: Diagnosis not present

## 2014-06-19 DIAGNOSIS — H3531 Nonexudative age-related macular degeneration: Secondary | ICD-10-CM | POA: Diagnosis not present

## 2014-06-24 ENCOUNTER — Encounter: Payer: Self-pay | Admitting: Oncology

## 2014-06-25 ENCOUNTER — Encounter: Payer: Self-pay | Admitting: Oncology

## 2014-06-25 ENCOUNTER — Other Ambulatory Visit: Payer: Self-pay | Admitting: Nurse Practitioner

## 2014-06-25 ENCOUNTER — Encounter: Payer: TRICARE For Life (TFL) | Admitting: Nurse Practitioner

## 2014-06-25 ENCOUNTER — Telehealth: Payer: Self-pay | Admitting: *Deleted

## 2014-06-25 DIAGNOSIS — C159 Malignant neoplasm of esophagus, unspecified: Secondary | ICD-10-CM

## 2014-06-25 NOTE — Telephone Encounter (Addendum)
SPOKE TO PT.S' DAUGHTER. DONNA NEALE. SHE STATES PT. CAME TO THIS OFFICE TODAY ALONE AND WAS TOLD HE DID NOT HAVE AN APPOINTMENT. PT. IS HAVING DIFFICULTY SWALLOWING AGAIN. HE IS HAVING INTERMITTENT, "SHOOTING"  LOWER RIGHT ABDOMINAL PAIN. INFORMED DONNA WILL RESCHEDULE PT. FOR 06/26/14 AT 10:00AM. IF PT.'S CONDITION SHOULD WORSEN WILL NEED TO GO TO THE EMERGENCY ROOM FOR AN EVALUATION. DONNA VOICES UNDERSTANDING. CALLED PT. AND HIS DAUGHTER, DONNA, TO COME AT 9:30AM FOR LAB BEFORE SEEING CINDEE BACON,NP.

## 2014-06-25 NOTE — Telephone Encounter (Signed)
Per Pt email; contacted pt's daughter [name] who reports that pt is vomiting and "not able to eat or drink a lot and may need fluids"  [name] states that he is weak, denies fever "but yesterday he threw up several times after eating; I think he needs fluids"  Offered pt to be seen in [facility] with Selena Lesser, NP today @ 1130 for evaluation and [name] confirmed "we will be there"  Schedulers and [facility] notified.

## 2014-06-26 ENCOUNTER — Ambulatory Visit (HOSPITAL_BASED_OUTPATIENT_CLINIC_OR_DEPARTMENT_OTHER): Payer: Medicare Other | Admitting: Nurse Practitioner

## 2014-06-26 ENCOUNTER — Telehealth: Payer: Self-pay | Admitting: Nurse Practitioner

## 2014-06-26 ENCOUNTER — Telehealth: Payer: Self-pay | Admitting: *Deleted

## 2014-06-26 VITALS — BP 161/63 | HR 85 | Temp 97.5°F | Resp 18 | Wt 127.0 lb

## 2014-06-26 DIAGNOSIS — N289 Disorder of kidney and ureter, unspecified: Secondary | ICD-10-CM

## 2014-06-26 DIAGNOSIS — E860 Dehydration: Secondary | ICD-10-CM

## 2014-06-26 DIAGNOSIS — R634 Abnormal weight loss: Secondary | ICD-10-CM

## 2014-06-26 DIAGNOSIS — C160 Malignant neoplasm of cardia: Secondary | ICD-10-CM

## 2014-06-26 DIAGNOSIS — R630 Anorexia: Secondary | ICD-10-CM

## 2014-06-26 DIAGNOSIS — E876 Hypokalemia: Secondary | ICD-10-CM

## 2014-06-26 DIAGNOSIS — G893 Neoplasm related pain (acute) (chronic): Secondary | ICD-10-CM

## 2014-06-26 DIAGNOSIS — C159 Malignant neoplasm of esophagus, unspecified: Secondary | ICD-10-CM

## 2014-06-26 NOTE — Telephone Encounter (Signed)
Call from [place] in hospice admissions. Pt's daughter is requesting services. Verbal order given for hospice referral, per Ned Card, NP. They will contact family.

## 2014-06-26 NOTE — Telephone Encounter (Signed)
, °

## 2014-06-29 ENCOUNTER — Telehealth: Payer: Self-pay | Admitting: *Deleted

## 2014-06-29 ENCOUNTER — Telehealth: Payer: Self-pay | Admitting: Gastroenterology

## 2014-06-29 ENCOUNTER — Other Ambulatory Visit (HOSPITAL_BASED_OUTPATIENT_CLINIC_OR_DEPARTMENT_OTHER): Payer: Medicare Other

## 2014-06-29 ENCOUNTER — Ambulatory Visit: Payer: Medicare Other

## 2014-06-29 ENCOUNTER — Other Ambulatory Visit: Payer: Self-pay | Admitting: *Deleted

## 2014-06-29 ENCOUNTER — Other Ambulatory Visit (HOSPITAL_COMMUNITY): Payer: Self-pay | Admitting: Cardiology

## 2014-06-29 ENCOUNTER — Ambulatory Visit (HOSPITAL_BASED_OUTPATIENT_CLINIC_OR_DEPARTMENT_OTHER): Payer: Medicare Other | Admitting: Oncology

## 2014-06-29 VITALS — BP 169/66 | HR 73 | Temp 97.5°F | Resp 18 | Ht 70.0 in | Wt 125.8 lb

## 2014-06-29 DIAGNOSIS — E1142 Type 2 diabetes mellitus with diabetic polyneuropathy: Secondary | ICD-10-CM

## 2014-06-29 DIAGNOSIS — C159 Malignant neoplasm of esophagus, unspecified: Secondary | ICD-10-CM

## 2014-06-29 DIAGNOSIS — C160 Malignant neoplasm of cardia: Secondary | ICD-10-CM

## 2014-06-29 DIAGNOSIS — Z95828 Presence of other vascular implants and grafts: Secondary | ICD-10-CM

## 2014-06-29 DIAGNOSIS — I10 Essential (primary) hypertension: Secondary | ICD-10-CM

## 2014-06-29 DIAGNOSIS — D6481 Anemia due to antineoplastic chemotherapy: Secondary | ICD-10-CM

## 2014-06-29 DIAGNOSIS — R1310 Dysphagia, unspecified: Secondary | ICD-10-CM

## 2014-06-29 DIAGNOSIS — N189 Chronic kidney disease, unspecified: Secondary | ICD-10-CM

## 2014-06-29 DIAGNOSIS — I739 Peripheral vascular disease, unspecified: Secondary | ICD-10-CM

## 2014-06-29 LAB — COMPREHENSIVE METABOLIC PANEL (CC13)
ALK PHOS: 321 U/L — AB (ref 40–150)
ALT: 21 U/L (ref 0–55)
AST: 24 U/L (ref 5–34)
Albumin: 3 g/dL — ABNORMAL LOW (ref 3.5–5.0)
Anion Gap: 11 mEq/L (ref 3–11)
BILIRUBIN TOTAL: 0.69 mg/dL (ref 0.20–1.20)
BUN: 22.4 mg/dL (ref 7.0–26.0)
CALCIUM: 9.4 mg/dL (ref 8.4–10.4)
CHLORIDE: 105 meq/L (ref 98–109)
CO2: 26 meq/L (ref 22–29)
Creatinine: 1.1 mg/dL (ref 0.7–1.3)
EGFR: 64 mL/min/{1.73_m2} — AB (ref >90)
Glucose: 139 mg/dl (ref 70–140)
Potassium: 4.3 mEq/L (ref 3.5–5.1)
Sodium: 142 mEq/L (ref 136–145)
Total Protein: 5.9 g/dL — ABNORMAL LOW (ref 6.4–8.3)

## 2014-06-29 LAB — CBC WITH DIFFERENTIAL/PLATELET
BASO%: 0.2 % (ref 0.0–2.0)
Basophils Absolute: 0 10*3/uL (ref 0.0–0.1)
EOS%: 0.2 % (ref 0.0–7.0)
Eosinophils Absolute: 0 10*3/uL (ref 0.0–0.5)
HCT: 37.8 % — ABNORMAL LOW (ref 38.4–49.9)
HGB: 12.5 g/dL — ABNORMAL LOW (ref 13.0–17.1)
LYMPH%: 19.8 % (ref 14.0–49.0)
MCH: 29.9 pg (ref 27.2–33.4)
MCHC: 33.1 g/dL (ref 32.0–36.0)
MCV: 90.4 fL (ref 79.3–98.0)
MONO#: 0.5 10*3/uL (ref 0.1–0.9)
MONO%: 8.8 % (ref 0.0–14.0)
NEUT%: 71 % (ref 39.0–75.0)
NEUTROS ABS: 3.7 10*3/uL (ref 1.5–6.5)
PLATELETS: 149 10*3/uL (ref 140–400)
RBC: 4.18 10*6/uL — ABNORMAL LOW (ref 4.20–5.82)
RDW: 14.7 % — ABNORMAL HIGH (ref 11.0–14.6)
WBC: 5.2 10*3/uL (ref 4.0–10.3)
lymph#: 1 10*3/uL (ref 0.9–3.3)

## 2014-06-29 MED ORDER — SODIUM CHLORIDE 0.9 % IJ SOLN
10.0000 mL | INTRAMUSCULAR | Status: DC | PRN
  Administered 2014-06-29: 10 mL via INTRAVENOUS
  Filled 2014-06-29: qty 10

## 2014-06-29 MED ORDER — HEPARIN SOD (PORK) LOCK FLUSH 100 UNIT/ML IV SOLN
500.0000 [IU] | Freq: Once | INTRAVENOUS | Status: AC
  Administered 2014-06-29: 500 [IU] via INTRAVENOUS
  Filled 2014-06-29: qty 5

## 2014-06-29 MED ORDER — HYDROCODONE-ACETAMINOPHEN 7.5-325 MG/15ML PO SOLN: 10.0000 mL | Freq: Four times a day (QID) | ORAL | Status: AC | PRN

## 2014-06-29 NOTE — Patient Instructions (Signed)

## 2014-06-29 NOTE — Telephone Encounter (Signed)
Called Dr. Ardis Hughs' office to request appt this week to evaluate possible esophageal stricture. Scheduler unable to find appt, she will have nurse call this office.

## 2014-06-29 NOTE — Progress Notes (Signed)
  [facility] OFFICE PROGRESS NOTE   Diagnosis: Esophagus cancer  INTERVAL HISTORY:   Mr. Heberle returns as scheduled. He reports intermittent pain at the right upper lateral abdomen. He is "spitting "constantly, but denies liquid dysphagia. He has difficulty eating some solids. Poor appetite.  Objective:  Vital signs in last 24 hours:  Blood pressure 169/66, pulse 73, temperature 97.5 F (36.4 C), temperature source Oral, resp. rate 18, height _0  (1.778 m), weight 125 lb 12.8 oz (57.063 kg), SpO2 100 %.    HEENT: The mucous membranes are moist Resp: Lungs clear bilaterally, no respiratory distress Cardio: Regular rate and rhythm GI: No hepatosplenomegaly, nontender Vascular: No leg edema   Lab Results:  Lab Results  Component Value Date   WBC 5.2 06/29/2014   HGB 12.5* 06/29/2014   HCT 37.8* 06/29/2014   MCV 90.4 06/29/2014   PLT 149 06/29/2014   NEUTROABS 3.7 06/29/2014     Medications: I have reviewed the patient's current medications.  Assessment/Plan: 1. GE junction adenocarcinoma  EGD 01/31/2014 with an ulcerated circumferential 2-3 cm mass at the GE junction all on the esophageal side causing incomplete stricture.   Biopsy showed invasive poorly differentiated adenocarcinoma; no amplification of HER-2.   CT abdomen/pelvis 01/30/2014 showed new hypodensities in the liver concerning for metastatic disease.  PET scan 03/04/2014 confirmed a hypermetabolic GE junction mass, numerous hypermetabolic liver metastases, and upper abdominal/retroperitoneal nodal metastases  Cycle 1 5-FU/leucovorin/etoposide11/08/2013 with Neulasta support 2. Dysphagia secondary to #1, progressive 3. Diabetes mellitus 4. Chronic kidney disease 5. Peripheral neuropathy related to diabetes 6. Peripheral arterial disease 7. Hypertension 8. Hyperlipidemia 9. Admission 03/17/2014 with an altered mental status-improved 10. Pancytopenia-baseline anemia/mild  pancytopenia, complicated by chemotherapy. 11. History of a Left lower leg wound in the setting of peripheral vascular disease and diabetes. 12. History of Anemia, multifactorial secondary to chemotherapy, renal failure, question blood loss related to the primary tumor.   Disposition:  Mr. Primo has a declining performance status. He is losing weight. He plans to enroll in the [facility] program tonight. He appears to be developing an esophagus of structure in. He is holding a cup and spitting frequently during examination today.  We discussed palliation of the dysphagia with radiation or placement of a stent. He is interested in stent placement if this can be performed. I will refer him to Dr. Ardis Hughs. He will use liquid hydrocodone as needed for pain. Mr. Yim will switch to a mechanical soft/liquid diet. He will contact us if he is unable to tolerate liquids. He will return for an office visit in one month.  Betsy Coder, MD  06/29/2014  3:38 PM

## 2014-06-29 NOTE — Telephone Encounter (Signed)
Dr Jacobs please review  

## 2014-06-30 ENCOUNTER — Telehealth: Payer: Self-pay | Admitting: *Deleted

## 2014-06-30 ENCOUNTER — Encounter: Payer: Self-pay | Admitting: Physician Assistant

## 2014-06-30 ENCOUNTER — Ambulatory Visit (INDEPENDENT_AMBULATORY_CARE_PROVIDER_SITE_OTHER): Payer: Medicare Other | Admitting: Physician Assistant

## 2014-06-30 ENCOUNTER — Encounter: Payer: Self-pay | Admitting: Nurse Practitioner

## 2014-06-30 ENCOUNTER — Telehealth: Payer: Self-pay | Admitting: Oncology

## 2014-06-30 ENCOUNTER — Encounter: Payer: Self-pay | Admitting: Oncology

## 2014-06-30 VITALS — BP 130/68 | HR 72 | Ht 70.0 in | Wt 126.0 lb

## 2014-06-30 DIAGNOSIS — R630 Anorexia: Secondary | ICD-10-CM | POA: Insufficient documentation

## 2014-06-30 DIAGNOSIS — G893 Neoplasm related pain (acute) (chronic): Secondary | ICD-10-CM | POA: Insufficient documentation

## 2014-06-30 DIAGNOSIS — N289 Disorder of kidney and ureter, unspecified: Secondary | ICD-10-CM | POA: Insufficient documentation

## 2014-06-30 DIAGNOSIS — R634 Abnormal weight loss: Secondary | ICD-10-CM | POA: Insufficient documentation

## 2014-06-30 DIAGNOSIS — C159 Malignant neoplasm of esophagus, unspecified: Secondary | ICD-10-CM

## 2014-06-30 NOTE — Telephone Encounter (Signed)
Lft msg for pt confirming MD visit per 02/23 POF, also called referral Dr. Ardis Hughs office but MD is already aware of situation and will be contacting pt to schedule... KJ

## 2014-06-30 NOTE — Patient Instructions (Addendum)
You have been scheduled for an endoscopy at [facility] on 07-09-14 at 9:30am. Please follow written instructions given to you at your visit today. If you use inhalers (even only as needed), please bring them with you on the day of your procedure.   MY:[id number]

## 2014-06-30 NOTE — Telephone Encounter (Signed)
Lets get him in the office today with extender.  Cannot tell if he has complete obstruction (had spit cup next to him at onc visit) or partial (said he tolerates liquids).

## 2014-06-30 NOTE — Assessment & Plan Note (Signed)
Patient last received 5-FU/leucovorin/etoposide chemotherapy on 03/13/2014. he is currently under observation only; received supportive care when needed.  Have discussed hospice referral with patient and his family in the past.  Patient has plans to return on 06/29/2014 for labs and a follow-up visit.

## 2014-06-30 NOTE — Assessment & Plan Note (Signed)
Patient has lost a significant amount of weight in the last few months.  Patient states that he has minimal appetite and poor oral intake on a regular basis.  Patient was encouraged to push protein and eat multiple small meals throughout the day at all possible.

## 2014-06-30 NOTE — Assessment & Plan Note (Signed)
Labs obtained yesterday revealed a creatinine elevated to 1.68.  Most likely, this is secondary to dehydration.  Will continue to monitor closely.

## 2014-06-30 NOTE — Telephone Encounter (Signed)
Pt has been sch to see Cecille Rubin today at 1:45 pm Tonya to notify pt

## 2014-06-30 NOTE — Telephone Encounter (Signed)
Received call from [name] with Dr. Ardis Hughs. APP can see pt today at 1:45. Called pt with appointment. Notified [name] of appt as well.

## 2014-06-30 NOTE — Assessment & Plan Note (Signed)
Patient continues to complain of minimal appetite and significant weight loss.  Patient denies any issues with swallowing; although his daughter states the patient continually is spitting.  Patient was advised to eat multiple small meals throughout the day if at all possible.

## 2014-06-30 NOTE — Assessment & Plan Note (Signed)
Patient does continue to complain of some chronic right upper abdomen discomfort.  He states that he takes Vicodin on an as-needed basis.

## 2014-06-30 NOTE — Progress Notes (Signed)
Patient ID: [name], male   DOB: 1929/09/02, 81 y.o.   MRN: [id number]     History of Present Illness:   [name] is an 81 year old male who had an EGD by Dr. Ardis Hughs on 01/31/2014, at which time he was found to have an ulcerated, circumferential 2-3 cm mass at the GE junction causing an incomplete stricture. Biopsy showed invasive poorly differentiated adenocarcinoma with no amplification of her-2. CT of the abdomen and pelvis on 01/30/2014 showed new hypodensities in the liver concerning for metastatic disease. PET scan on 03/04/2014 confirmed a hypermetabolic GE junction mass, numerous hypermetabolic liver metastases, and upper abdominal/retroperitoneal nodal metastases. He has completed a cycle of chemotherapy. He was evaluated by Dr. Benay Spice of oncology yesterday and was noted to be losing weight. Patient states he has been spitting a lot. He has no difficulty swallowing soft foods like applesauce, yogurt, and soup but has difficulty with solid foods and has intermittent difficulty swallowing liquids. He does not feel liquids are pooling in the back of his throat, nor does he feel food wants to go down the wrong pipe. He is spitting intermittently into what vomit basing in the office. Further questioning reveals that he has had a slight headache and he points to the frontal sinus area with some congestion, intermittent sneezing, and postnasal drip. He does have Flonase at home but has not been using it.   Past Medical History  Diagnosis Date  . Carotid stenosis     u/s 5/11: R 0-39% L 40-59% (stable)  . HTN (hypertension)     refractory. unable to cardura in past due to hypotension  . Edema   . PAD (peripheral artery disease)   . Cerebrovascular disease, unspecified   . HLD (hyperlipidemia)   . Bradycardia   . Diabetes mellitus type II   . Hyperkalemia   . Shortness of breath   . Arthritis     " IN MY HANDS "  . Chronic kidney disease, stage III (moderate)   . Stroke   . Cancer    esophageal.   . History of recent fall 03/27/14    [facility] [place] called    Past Surgical History  Procedure Laterality Date  . Carotid endarterectomy  1/01    right  . Balloon angioplasty, artery Bilateral 04/09/2013    LEFT ILIAC & RT COMMON ILIAC     DR ARIDA    . Femoral artery stent  ?   2010    DR COOPER  . Esophagogastroduodenoscopy N/A 01/31/2014    Procedure: ESOPHAGOGASTRODUODENOSCOPY (EGD);  Surgeon: Milus Banister, MD;  Location: [facility];  Service: Endoscopy;  Laterality: N/A;  . Abdominal aortagram N/A 04/09/2013    Procedure: ABDOMINAL AORTAGRAM;  Surgeon: Wellington Hampshire, MD;  Location: [place] CATH LAB;  Service: Cardiovascular;  Laterality: N/A;  . Lower extremity angiogram Bilateral 04/10/2013    Procedure: [facility] STENT;  Surgeon: Wellington Hampshire, MD;  Location: [facility] CATH LAB;  Service: Cardiovascular;  Laterality: Bilateral;  . Abdominal aortagram N/A 05/21/2013    Procedure: ABDOMINAL Maxcine Ham;  Surgeon: Wellington Hampshire, MD;  Location: [facility] CATH LAB;  Service: Cardiovascular;  Laterality: N/A;   Family History  Problem Relation Age of Onset  . Coronary artery disease Father    History  Substance Use Topics  . Smoking status: Former Smoker    Quit date: 05/08/1994  . Smokeless tobacco: Never Used  . Alcohol Use: No   Current Outpatient Prescriptions  Medication Sig Dispense  Refill  . feeding supplement, GLUCERNA SHAKE, (GLUCERNA SHAKE) LIQD Take 237 mLs by mouth 3 (three) times daily between meals. 237 mL 0  . HYDROcodone-acetaminophen (HYCET) 7.5-325 mg/15 ml solution Take 10-15 mLs by mouth every 6 (six) hours as needed for moderate pain. 480 mL 0  . megestrol (MEGACE) 40 MG/ML suspension Take 5 mLs (200 mg total) by mouth 2 (two) times daily. 300 mL 0  . polyethylene glycol powder (GLYCOLAX/MIRALAX) powder Mix 17 Grams daily.  Mix powder with 8 oz beverage of choice and drink 500 g 0  . PRESCRIPTION MEDICATION Chemo [facility]    . senna-docusate  (SENOKOT-S) 8.6-50 MG per tablet Take 1 tablet by mouth 2 (two) times daily.    . sorbitol 70 % SOLN Take 30 mLs by mouth daily. 1000 mL 1  . spironolactone (ALDACTONE) 25 MG tablet Take 1 tablet (25 mg total) by mouth 2 (two) times daily. 60 tablet 0  . traMADol (ULTRAM) 50 MG tablet Take 1 tablet (50 mg total) by mouth every 12 (twelve) hours as needed for moderate pain. 60 tablet 3   No current facility-administered medications for this visit.   Facility-Administered Medications Ordered in Other Visits  Medication Dose Route Frequency Provider Last Rate Last Dose  . sodium chloride 0.9 % injection 10 mL  10 mL Intracatheter PRN Ladell Pier, MD   10 mL at 03/11/14 1442   Allergies  Allergen Reactions  . Contrast Media [Iodinated Diagnostic Agents] Other (See Comments)    Reaction:  History of renal failure following contrast requiring temporary dialysis.    . Metformin Hcl Other (See Comments)    Reaction:  Renal failure----Pt states that "metformin will kill me".  . Aliskiren Rash  . Doxazosin Mesylate Rash      Review of Systems: Gen: Denies any fever, chills, sweats, anorexia, fatigue, weakness, malaise, weight loss, and sleep disorder CV: Denies chest pain, angina, palpitations, syncope, orthopnea, PND, peripheral edema, and claudication. Resp: Denies dyspnea at rest, dyspnea with exercise, cough, sputum, wheezing, coughing up blood, and pleurisy. GI: Denies vomiting blood, jaundice, and fecal incontinence.   Has ysphagia to solids and some liquids. GU : Denies urinary burning, blood in urine, urinary frequency, urinary hesitancy, nocturnal urination, and urinary incontinence. MS: Denies joint pain, limitation of movement, and swelling, stiffness, low back pain, extremity pain. Denies muscle weakness, cramps, atrophy.  Derm: Denies rash, itching, dry skin, hives, moles, warts, or unhealing ulcers.  Psych: Denies depression, anxiety, memory loss, suicidal ideation,  hallucinations, paranoia, and confusion. Heme: Denies bruising, bleeding, and enlarged lymph nodes. Neuro:  Denies any headaches, dizziness, paresthesia Endo:  Denies any problems with DM, thyroid, adrenal  LAB RESULTS:  Recent Labs  06/29/14 1433  WBC 5.2  HGB 12.5*  HCT 37.8*  PLT 149   BMET  Recent Labs  06/29/14 1433  NA 142  K 4.3  CO2 26  GLUCOSE 139  BUN 22.4  CREATININE 1.1  CALCIUM 9.4   LFT  Recent Labs  06/29/14 1433  PROT 5.9*  ALBUMIN 3.0*  AST 24  ALT 21  ALKPHOS 321*  BILITOT 0.69     Physical Exam: General: Pleasant, thin,male in no acute distress Head: Normocephalic and atraumatic Eyes:  sclerae anicteric, conjunctiva pink  Ears: Normal auditory acuity Lungs: Clear throughout to auscultation Heart: Regular rate and rhythm Abdomen: Soft, non distended, non-tender. No masses, no hepatomegaly. Normal bowel sounds Musculoskeletal: Symmetrical with no gross deformities  Extremities: No edema  Neurological: Alert oriented  x 4, grossly nonfocal Psychological:  Alert and cooperative. Normal mood and affect  Assessment and Recommendations:  81 year old male with esophageal adenocarcinoma experiencing progressive dysphagia referred for evaluation. He seems to be developing an esophageal stricture. The patient plans to enroll in [facility]. At this point is to schedule him for an EGD with placement of an esophageal stent for palliation. This will be performed next week at [facility] by Dr. Ardis Hughs. In the meantime the patient has been instructed to use his Flonase to see if it will decrease his postnasal drip and decrease some of his spitting. He has been instructed to remain on a soft or pured diet.   Silus Lanzo, Deloris Ping 06/30/2014,  CC:Dr. Benay Spice

## 2014-06-30 NOTE — Assessment & Plan Note (Signed)
Patient's daughter reports the patient has been having some nausea/vomiting; and is continually spitting.  However, patient denies any nausea or vomiting; and also denies difficulty with management of secretions.  Patient states that he can drink and eat with no difficulty; but just has no appetite.  When questioned regarding need for IV fluid secondary to dehydration-patient states that he wants to go to [employer] and have some other soup for lunch.  Long discussion with both patient and his daughter regarding need for adequate nutrition and hydration.  Advised patient and his daughter to call/return if/when he feels dehydrated and needs IV fluid rehydration.

## 2014-06-30 NOTE — Progress Notes (Signed)
SYMPTOM MANAGEMENT CLINIC   HPI: [name] 81 y.o. male diagnosed with esophageal cancer.  Patient is status post 5-FU/leucovorin/etoposide chemotherapy last received in November 2015.  Patient is currently under observation only; and receive supportive care when needed.  Patient's daughter called the cancer Center yesterday requested that patient be seen.  Daughter reports that patient has been having nausea and vomiting; and is continuously spitting.  Patient has had minimal appetite and continues with significant weight loss.  Daughter feels that patient is dehydrated today.  He also continues to complain of some chronic right upper abdomen pain; takes Vicodin on an as-needed basis.  Patient denies any diarrhea or constipation.  He also denies any recent fevers or chills.   HPI  ROS  Past Medical History  Diagnosis Date  . Carotid stenosis     u/s 5/11: R 0-39% L 40-59% (stable)  . HTN (hypertension)     refractory. unable to cardura in past due to hypotension  . Edema   . PAD (peripheral artery disease)   . Cerebrovascular disease, unspecified   . HLD (hyperlipidemia)   . Bradycardia   . Diabetes mellitus type II   . Hyperkalemia   . Shortness of breath   . Arthritis     " IN MY HANDS "  . Chronic kidney disease, stage III (moderate)   . Stroke   . Cancer     esophageal.   . History of recent fall 03/27/14    [facility] in [place] called    Past Surgical History  Procedure Laterality Date  . Carotid endarterectomy  1/01    right  . Balloon angioplasty, artery Bilateral 04/09/2013    LEFT ILIAC & RT COMMON ILIAC     DR ARIDA    . Femoral artery stent  ?   2010    DR COOPER  . Esophagogastroduodenoscopy N/A 01/31/2014    Procedure: ESOPHAGOGASTRODUODENOSCOPY (EGD);  Surgeon: Milus Banister, MD;  Location: [facility];  Service: Endoscopy;  Laterality: N/A;  . Abdominal aortagram N/A 04/09/2013    Procedure: ABDOMINAL AORTAGRAM;  Surgeon: Wellington Hampshire, MD;  Location: [place] CATH LAB;  Service: Cardiovascular;  Laterality: N/A;  . Lower extremity angiogram Bilateral 04/10/2013    Procedure: [facility] STENT;  Surgeon: Wellington Hampshire, MD;  [facility];  Service: Cardiovascular;  Laterality: Bilateral;  . Abdominal aortagram N/A 05/21/2013    Procedure: ABDOMINAL Maxcine Ham;  Surgeon: Wellington Hampshire, MD;  Location[facility] CATH LAB;  Service: Cardiovascular;  Laterality: N/A;    has DIABETES MELLITUS, TYPE II; HYPERLIPIDEMIA; BRADYCARDIA; CAROTID STENOSIS; CEREBROVASCULAR DISEASE; ATHEROSCLEROSIS W /INT CLAUDICATION; ATHEROSCLEROSIS W/ REST PAIN; ABDOMINAL AORTIC ANEURYSM; EDEMA; PAD (peripheral artery disease); Atherosclerosis of native arteries of the extremities with ulceration(440.23); Chronic kidney disease, stage III (moderate); Claudication in peripheral vascular disease; Esophageal cancer; Dysphagia, unspecified(787.20); Dehydration; Hematoma of right chest wall; Hypertension; Acute encephalopathy; Hypertensive urgency, malignant; Acute renal failure; SIRS (systemic inflammatory response syndrome); Protein-calorie malnutrition, severe; Dyspnea; Anorexia; Weight loss; Renal insufficiency; and Cancer associated pain on his problem list.    is allergic to contrast media; metformin hcl; aliskiren; and doxazosin mesylate.    Medication List       This list is accurate as of: 06/26/14 11:59 PM.  Always use your most recent med list.               amLODipine 10 MG tablet  Commonly known as:  NORVASC  Take 1 tablet (10 mg total)  by mouth daily.     cloNIDine 0.3 MG tablet  Commonly known as:  CATAPRES  Take 0.6 mg by mouth 2 (two) times daily.     feeding supplement (GLUCERNA SHAKE) Liqd  Take 237 mLs by mouth 3 (three) times daily between meals.     furosemide 20 MG tablet  Commonly known as:  LASIX  Take 40 mg (2 tabs) PO Q AM for next 2 days; then return to taking 20 mg (1 tab) PO Q AM.     hydrALAZINE 50 MG tablet  Commonly  known as:  APRESOLINE  Take 1.5 tablets (75 mg total) by mouth 3 (three) times daily.     HYDROcodone-acetaminophen 5-325 MG per tablet  Commonly known as:  NORCO/VICODIN  Take 1-2 tablets by mouth every 6 (six) hours as needed for moderate pain or severe pain.     hydrocodone-ibuprofen 5-200 MG per tablet  Commonly known as:  VICOPROFEN  Take 1 tablet by mouth every 8 (eight) hours as needed for pain.     insulin aspart 100 UNIT/ML FlexPen  Commonly known as:  NOVOLOG FLEXPEN  - Use sliding scale as follows   -  CBG 121 - 150: 2 units    -  CBG 151 - 200: 3 units    -  CBG 201 - 250: 5 units    -  CBG 251 - 300: 8 units    -  CBG 301 - 350: 11 units    -  CBG 351 - 400: 15 units     levofloxacin 750 MG tablet  Commonly known as:  LEVAQUIN  Take 1 tablet (750 mg total) by mouth daily.     lidocaine-prilocaine cream  Commonly known as:  EMLA  Apply 1 application topically as needed. Do not rub in. Cover with plastic.     megestrol 40 MG/ML suspension  Commonly known as:  MEGACE  Take 5 mLs (200 mg total) by mouth 2 (two) times daily.     polyethylene glycol powder powder  Commonly known as:  GLYCOLAX/MIRALAX  Mix 17 Grams daily.  Mix powder with 8 oz beverage of choice and drink     potassium chloride SA 20 MEQ tablet  Commonly known as:  K-DUR,KLOR-CON  Take 1 tablet (20 mEq total) by mouth daily.     pregabalin 75 MG capsule  Commonly known as:  LYRICA  Take 75 mg by mouth 3 (three) times daily.     PRESCRIPTION MEDICATION  Chemo [facility]     senna-docusate 8.6-50 MG per tablet  Commonly known as:  Senokot-S  Take 1 tablet by mouth 2 (two) times daily.     sorbitol 70 % Soln  Take 30 mLs by mouth daily.     spironolactone 25 MG tablet  Commonly known as:  ALDACTONE  Take 1 tablet (25 mg total) by mouth 2 (two) times daily.     traMADol 50 MG tablet  Commonly known as:  ULTRAM  Take 1 tablet (50 mg total) by mouth every 12 (twelve) hours as  needed for moderate pain.         PHYSICAL EXAMINATION  Blood pressure 161/63, pulse 85, temperature 97.5 F (36.4 C), temperature source Oral, resp. rate 18, weight 127 lb (57.607 kg), SpO2 100 %.  Physical Exam  Constitutional: He is oriented to person, place, and time. Vital signs are normal. He appears malnourished and dehydrated. He appears unhealthy. He appears cachectic.  Patient appears weak, frail, and chronically  ill.  HENT:  Head: Normocephalic and atraumatic.  Mouth/Throat: Oropharynx is clear and moist.  Eyes: Conjunctivae and EOM are normal. Pupils are equal, round, and reactive to light. Right eye exhibits no discharge. Left eye exhibits no discharge. No scleral icterus.  Neck: Normal range of motion. Neck supple. No JVD present. No tracheal deviation present. No thyromegaly present.  Cardiovascular: Normal rate, regular rhythm, normal heart sounds and intact distal pulses.   Pulmonary/Chest: Effort normal and breath sounds normal. No respiratory distress. He has no wheezes. He has no rales. He exhibits no tenderness.  Abdominal: Soft. Bowel sounds are normal. He exhibits no distension and no mass. There is no tenderness. There is no rebound and no guarding.  Musculoskeletal: Normal range of motion. He exhibits no edema or tenderness.  Lymphadenopathy:    He has no cervical adenopathy.  Neurological: He is alert and oriented to person, place, and time.  Skin: Skin is warm and dry. No rash noted. No erythema. There is pallor.  Psychiatric: Affect normal.  Nursing note and vitals reviewed.   LABORATORY DATA:. No visits with results within 3 Day(s) from this visit. Latest known visit with results is:  Admission on 05/25/2014, Discharged on 05/25/2014  Component Date Value Ref Range Status  . WBC 05/25/2014 10.5  4.0 - 10.5 K/uL Final  . RBC 05/25/2014 4.78  4.22 - 5.81 MIL/uL Final  . Hemoglobin 05/25/2014 14.0  13.0 - 17.0 g/dL Final  . HCT 05/25/2014 41.0  39.0 -  52.0 % Final  . MCV 05/25/2014 85.8  78.0 - 100.0 fL Final  . MCH 05/25/2014 29.3  26.0 - 34.0 pg Final  . MCHC 05/25/2014 34.1  30.0 - 36.0 g/dL Final  . RDW 05/25/2014 16.3* 11.5 - 15.5 % Final  . Platelets 05/25/2014 164  150 - 400 K/uL Final  . Neutrophils Relative % 05/25/2014 84* 43 - 77 % Final  . Neutro Abs 05/25/2014 8.7* 1.7 - 7.7 K/uL Final  . Lymphocytes Relative 05/25/2014 12  12 - 46 % Final  . Lymphs Abs 05/25/2014 1.3  0.7 - 4.0 K/uL Final  . Monocytes Relative 05/25/2014 4  3 - 12 % Final  . Monocytes Absolute 05/25/2014 0.5  0.1 - 1.0 K/uL Final  . Eosinophils Relative 05/25/2014 0  0 - 5 % Final  . Eosinophils Absolute 05/25/2014 0.0  0.0 - 0.7 K/uL Final  . Basophils Relative 05/25/2014 0  0 - 1 % Final  . Basophils Absolute 05/25/2014 0.0  0.0 - 0.1 K/uL Final  . Sodium 05/25/2014 139  135 - 145 mmol/L Final   Please note change in reference range.  . Potassium 05/25/2014 4.8  3.5 - 5.1 mmol/L Final   Please note change in reference range.  . Chloride 05/25/2014 105  96 - 112 mEq/L Final  . CO2 05/25/2014 18* 19 - 32 mmol/L Final  . Glucose, Bld 05/25/2014 204* 70 - 99 mg/dL Final  . BUN 05/25/2014 23  6 - 23 mg/dL Final  . Creatinine, Ser 05/25/2014 1.68* 0.50 - 1.35 mg/dL Final  . Calcium 05/25/2014 9.5  8.4 - 10.5 mg/dL Final  . GFR calc non Af Amer 05/25/2014 36* >90 mL/min Final  . GFR calc Af Amer 05/25/2014 42* >90 mL/min Final   Comment: (NOTE) The eGFR has been calculated using the CKD EPI equation. This calculation has not been validated in all clinical situations. eGFR's persistently <90 mL/min signify possible Chronic Kidney Disease.   . Anion gap 05/25/2014 16*  5 - 15 Final  . Color, Urine 05/25/2014 YELLOW  YELLOW Final  . APPearance 05/25/2014 CLEAR  CLEAR Final  . Specific Gravity, Urine 05/25/2014 1.017  1.005 - 1.030 Final  . pH 05/25/2014 5.5  5.0 - 8.0 Final  . Glucose, UA 05/25/2014 NEGATIVE  NEGATIVE mg/dL Final  . Hgb urine dipstick  05/25/2014 NEGATIVE  NEGATIVE Final  . Bilirubin Urine 05/25/2014 NEGATIVE  NEGATIVE Final  . Ketones, ur 05/25/2014 15* NEGATIVE mg/dL Final  . Protein, ur 05/25/2014 100* NEGATIVE mg/dL Final  . Urobilinogen, UA 05/25/2014 1.0  0.0 - 1.0 mg/dL Final  . Nitrite 05/25/2014 NEGATIVE  NEGATIVE Final  . Leukocytes, UA 05/25/2014 NEGATIVE  NEGATIVE Final  . Squamous Epithelial / LPF 05/25/2014 RARE  RARE Final  . WBC, UA 05/25/2014 0-2  <3 WBC/hpf Final  . Bacteria, UA 05/25/2014 FEW* RARE Final  . Casts 05/25/2014 HYALINE CASTS* NEGATIVE Final     RADIOGRAPHIC STUDIES: No results found.  ASSESSMENT/PLAN:    Anorexia Patient continues to complain of minimal appetite and significant weight loss.  Patient denies any issues with swallowing; although his daughter states the patient continually is spitting.  Patient was advised to eat multiple small meals throughout the day if at all possible.   Cancer associated pain Patient does continue to complain of some chronic right upper abdomen discomfort.  He states that he takes Vicodin on an as-needed basis.   Dehydration Patient's daughter reports the patient has been having some nausea/vomiting; and is continually spitting.  However, patient denies any nausea or vomiting; and also denies difficulty with management of secretions.  Patient states that he can drink and eat with no difficulty; but just has no appetite.  When questioned regarding need for IV fluid secondary to dehydration-patient states that he wants to go to [employer] and have some other soup for lunch.  Long discussion with both patient and his daughter regarding need for adequate nutrition and hydration.  Advised patient and his daughter to call/return if/when he feels dehydrated and needs IV fluid rehydration.   Esophageal cancer Patient last received 5-FU/leucovorin/etoposide chemotherapy on 03/13/2014. he is currently under observation only; received supportive care when  needed.  Have discussed hospice referral with patient and his family in the past.  Patient has plans to return on 06/29/2014 for labs and a follow-up visit.     Renal insufficiency Labs obtained yesterday revealed a creatinine elevated to 1.68.  Most likely, this is secondary to dehydration.  Will continue to monitor closely.   Weight loss Patient has lost a significant amount of weight in the last few months.  Patient states that he has minimal appetite and poor oral intake on a regular basis.  Patient was encouraged to push protein and eat multiple small meals throughout the day at all possible.   Patient stated understanding of all instructions; and was in agreement with this plan of care. The patient knows to call the clinic with any problems, questions or concerns.   Review/collaboration with Dr. Benay Spice regarding all aspects of patient's visit today.   Total time spent with patient was 25 minutes;  with greater than 75 percent of that time spent in face to face counseling regarding patient's symptoms,  and coordination of care and follow up.  Disclaimer: This note was dictated with voice recognition software. Similar sounding words can inadvertently be transcribed and may not be corrected upon review.   Drue Second, NP 06/30/2014

## 2014-07-01 ENCOUNTER — Encounter (HOSPITAL_COMMUNITY): Payer: Self-pay | Admitting: *Deleted

## 2014-07-02 NOTE — Progress Notes (Signed)
I agree with the above note, plan. Have arranged with [facility] endo to have stent ready for next week's case.

## 2014-07-05 ENCOUNTER — Encounter: Payer: Self-pay | Admitting: Gastroenterology

## 2014-07-07 ENCOUNTER — Emergency Department (HOSPITAL_COMMUNITY)

## 2014-07-07 ENCOUNTER — Encounter (HOSPITAL_COMMUNITY): Payer: Self-pay | Admitting: Emergency Medicine

## 2014-07-07 ENCOUNTER — Emergency Department (HOSPITAL_COMMUNITY)
Admission: EM | Admit: 2014-07-07 | Discharge: 2014-07-07 | Disposition: A | Discharge status: Home / Self Care | Attending: Emergency Medicine | Admitting: Emergency Medicine

## 2014-07-07 DIAGNOSIS — R0600 Dyspnea, unspecified: Secondary | ICD-10-CM | POA: Diagnosis not present

## 2014-07-07 DIAGNOSIS — Y998 Other external cause status: Secondary | ICD-10-CM | POA: Insufficient documentation

## 2014-07-07 DIAGNOSIS — C159 Malignant neoplasm of esophagus, unspecified: Secondary | ICD-10-CM | POA: Insufficient documentation

## 2014-07-07 DIAGNOSIS — R079 Chest pain, unspecified: Secondary | ICD-10-CM

## 2014-07-07 DIAGNOSIS — S299XXA Unspecified injury of thorax, initial encounter: Secondary | ICD-10-CM | POA: Diagnosis not present

## 2014-07-07 DIAGNOSIS — Y9389 Activity, other specified: Secondary | ICD-10-CM | POA: Insufficient documentation

## 2014-07-07 DIAGNOSIS — W1839XA Other fall on same level, initial encounter: Secondary | ICD-10-CM | POA: Diagnosis not present

## 2014-07-07 DIAGNOSIS — Y9289 Other specified places as the place of occurrence of the external cause: Secondary | ICD-10-CM | POA: Insufficient documentation

## 2014-07-07 LAB — I-STAT TROPONIN, ED: Troponin i, poc: 0.03 ng/mL (ref 0.00–0.08)

## 2014-07-07 LAB — CBC
HCT: 40.1 % (ref 39.0–52.0)
Hemoglobin: 13 g/dL (ref 13.0–17.0)
MCH: 30.2 pg (ref 26.0–34.0)
MCHC: 32.4 g/dL (ref 30.0–36.0)
MCV: 93.3 fL (ref 78.0–100.0)
Platelets: 173 10*3/uL (ref 150–400)
RBC: 4.3 MIL/uL (ref 4.22–5.81)
RDW: 14.7 % (ref 11.5–15.5)
WBC: 10.7 10*3/uL — ABNORMAL HIGH (ref 4.0–10.5)

## 2014-07-07 LAB — URINE MICROSCOPIC-ADD ON

## 2014-07-07 LAB — URINALYSIS, ROUTINE W REFLEX MICROSCOPIC
Glucose, UA: NEGATIVE mg/dL
Hgb urine dipstick: NEGATIVE
KETONES UR: NEGATIVE mg/dL
LEUKOCYTES UA: NEGATIVE
NITRITE: NEGATIVE
PH: 5 (ref 5.0–8.0)
Protein, ur: 30 mg/dL — AB
Specific Gravity, Urine: 1.023 (ref 1.005–1.030)
Urobilinogen, UA: 1 mg/dL (ref 0.0–1.0)

## 2014-07-07 LAB — BASIC METABOLIC PANEL
Anion gap: 10 (ref 5–15)
BUN: 23 mg/dL (ref 6–23)
CO2: 24 mmol/L (ref 19–32)
Calcium: 8.8 mg/dL (ref 8.4–10.5)
Chloride: 106 mmol/L (ref 96–112)
Creatinine, Ser: 1.1 mg/dL (ref 0.50–1.35)
GFR calc Af Amer: 70 mL/min — ABNORMAL LOW (ref >90)
GFR, EST NON AFRICAN AMERICAN: 61 mL/min — AB (ref >90)
GLUCOSE: 187 mg/dL — AB (ref 70–99)
POTASSIUM: 3.8 mmol/L (ref 3.5–5.1)
Sodium: 140 mmol/L (ref 135–145)

## 2014-07-07 LAB — PROTIME-INR
INR: 1.22 (ref 0.00–1.49)
Prothrombin Time: 15.6 seconds — ABNORMAL HIGH (ref 11.6–15.2)

## 2014-07-07 LAB — CBG MONITORING, ED: GLUCOSE-CAPILLARY: 145 mg/dL — AB (ref 70–99)

## 2014-07-07 MED ORDER — HEPARIN SOD (PORK) LOCK FLUSH 100 UNIT/ML IV SOLN
500.0000 [IU] | Freq: Once | INTRAVENOUS | Status: AC
  Administered 2014-07-07: 500 [IU]

## 2014-07-07 MED ORDER — MORPHINE SULFATE 4 MG/ML IJ SOLN
4.0000 mg | Freq: Once | INTRAMUSCULAR | Status: AC
  Administered 2014-07-07: 4 mg via INTRAVENOUS
  Filled 2014-07-07: qty 1

## 2014-07-07 MED ORDER — SODIUM CHLORIDE 0.9 % IV BOLUS (SEPSIS)
500.0000 mL | Freq: Once | INTRAVENOUS | Status: AC
  Administered 2014-07-07: 500 mL via INTRAVENOUS

## 2014-07-07 MED ORDER — IOHEXOL 350 MG/ML SOLN
100.0000 mL | Freq: Once | INTRAVENOUS | Status: AC | PRN
  Administered 2014-07-07: 60 mL via INTRAVENOUS

## 2014-07-07 NOTE — ED Notes (Signed)
Pt states fell because he was dizziness and hit head. Pt stated he was on blood thinners.

## 2014-07-07 NOTE — ED Notes (Signed)
Bed: RESB Expected date:  Expected time:  Means of arrival:  Comments: Cancer patient-tachy

## 2014-07-07 NOTE — ED Provider Notes (Signed)
  Physical Exam  BP 127/60 mmHg  Pulse 100  Temp(Src) 97.6 F (36.4 C) (Oral)  Resp 26  SpO2 100%  Physical Exam  ED Course  Procedures  MDM Assumed care of pt from Dr. Tawnya Crook.  Pt has a ho esophageal cancer, however is not pursuing therapy.  Had chest pain at rest, had a fall, possibly syncopal.    Initial wu significant for pleural effusion, ? Worsening liver mets.  I discussed the results with the pt and planned admission, however he declined.  He stated he was hospice, and would not be admitted for any reason.  He acknowledged that he might die, and repeated risks to me.  I also discussed this with his wife, who agreed with plan.  They are exceptionally pleasant people, and on my discussion I asked if he would pursue any therapy, he stated no.  I told him if he did not want anything done, he could refuse transport.  He replied "really?"  He stated he would likely not call an ambulance as he would not want anything done.  DC home in stable condition.  1. Chest pain   2. Dyspnea   3. Esophageal cancer          Debby Freiberg, MD 07/07/14 [id number]

## 2014-07-07 NOTE — ED Notes (Addendum)
EMS called to house for fall, found patient lying on floor in front of door. Pt c/o dizziness for fall reason. Pt has hx of live, esophageal, and stomach cancer and a hospice patient. EMS gave patient 548ml NS prior to arrival. Pt took 10cc of home med hydrocodone prior to arrival.

## 2014-07-07 NOTE — Discharge Instructions (Signed)
Dehydration, Adult °Dehydration is when you lose more fluids from the body than you take in. Vital organs like the kidneys, brain, and heart cannot function without a proper amount of fluids and salt. Any loss of fluids from the body can cause dehydration.  °CAUSES  °· Vomiting. °· Diarrhea. °· Excessive sweating. °· Excessive urine output. °· Fever. °SYMPTOMS  °Mild dehydration °· Thirst. °· Dry lips. °· Slightly dry mouth. °Moderate dehydration °· Very dry mouth. °· Sunken eyes. °· Skin does not bounce back quickly when lightly pinched and released. °· Dark urine and decreased urine production. °· Decreased tear production. °· Headache. °Severe dehydration °· Very dry mouth. °· Extreme thirst. °· Rapid, weak pulse (more than 100 beats per minute at rest). °· Cold hands and feet. °· Not able to sweat in spite of heat and temperature. °· Rapid breathing. °· Blue lips. °· Confusion and lethargy. °· Difficulty being awakened. °· Minimal urine production. °· No tears. °DIAGNOSIS  °Your caregiver will diagnose dehydration based on your symptoms and your exam. Blood and urine tests will help confirm the diagnosis. The diagnostic evaluation should also identify the cause of dehydration. °TREATMENT  °Treatment of mild or moderate dehydration can often be done at home by increasing the amount of fluids that you drink. It is best to drink small amounts of fluid more often. Drinking too much at one time can make vomiting worse. Refer to the home care instructions below. °Severe dehydration needs to be treated at the hospital where you will probably be given intravenous (IV) fluids that contain water and electrolytes. °HOME CARE INSTRUCTIONS  °· Ask your caregiver about specific rehydration instructions. °· Drink enough fluids to keep your urine clear or pale yellow. °· Drink small amounts frequently if you have nausea and vomiting. °· Eat as you normally do. °· Avoid: °¨ Foods or drinks high in sugar. °¨ Carbonated  drinks. °¨ Juice. °¨ Extremely hot or cold fluids. °¨ Drinks with caffeine. °¨ Fatty, greasy foods. °¨ Alcohol. °¨ Tobacco. °¨ Overeating. °¨ Gelatin desserts. °· Wash your hands well to avoid spreading bacteria and viruses. °· Only take over-the-counter or prescription medicines for pain, discomfort, or fever as directed by your caregiver. °· Ask your caregiver if you should continue all prescribed and over-the-counter medicines. °· Keep all follow-up appointments with your caregiver. °SEEK MEDICAL CARE IF: °· You have abdominal pain and it increases or stays in one area (localizes). °· You have a rash, stiff neck, or severe headache. °· You are irritable, sleepy, or difficult to awaken. °· You are weak, dizzy, or extremely thirsty. °SEEK IMMEDIATE MEDICAL CARE IF:  °· You are unable to keep fluids down or you get worse despite treatment. °· You have frequent episodes of vomiting or diarrhea. °· You have blood or green matter (bile) in your vomit. °· You have blood in your stool or your stool looks black and tarry. °· You have not urinated in 6 to 8 hours, or you have only urinated a small amount of very dark urine. °· You have a fever. °· You faint. °MAKE SURE YOU:  °· Understand these instructions. °· Will watch your condition. °· Will get help right away if you are not doing well or get worse. °Document Released: 04/24/2005 Document Revised: 07/17/2011 Document Reviewed: 12/12/2010 °ExitCare® Patient Information ©2015 [employer]. This information is not intended to replace advice given to you by your health care provider. Make sure you discuss any questions you have with your health care   provider. ° °Near-Syncope °Near-syncope (commonly known as near fainting) is sudden weakness, dizziness, or feeling like you might pass out. During an episode of near-syncope, you may also develop pale skin, have tunnel vision, or feel sick to your stomach (nauseous). Near-syncope may occur when getting up after sitting or  while standing for a long time. It is caused by a sudden decrease in blood flow to the brain. This decrease can result from various causes or triggers, most of which are not serious. However, because near-syncope can sometimes be a sign of something serious, a medical evaluation is required. The specific cause is often not determined. °HOME CARE INSTRUCTIONS  °Monitor your condition for any changes. The following actions may help to alleviate any discomfort you are experiencing: °· Have someone stay with you until you feel stable. °· Lie down right away and prop your feet up if you start feeling like you might faint. Breathe deeply and steadily. Wait until all the symptoms have passed. Most of these episodes last only a few minutes. You may feel tired for several hours.   °· Drink enough fluids to keep your urine clear or pale yellow.   °· If you are taking blood pressure or heart medicine, get up slowly when seated or lying down. Take several minutes to sit and then stand. This can reduce dizziness. °· Follow up with your health care provider as directed.  °SEEK IMMEDIATE MEDICAL CARE IF:  °· You have a severe headache.   °· You have unusual pain in the chest, abdomen, or back.   °· You are bleeding from the mouth or rectum, or you have black or tarry stool.   °· You have an irregular or very fast heartbeat.   °· You have repeated fainting or have seizure-like jerking during an episode.   °· You faint when sitting or lying down.   °· You have confusion.   °· You have difficulty walking.   °· You have severe weakness.   °· You have vision problems.   °MAKE SURE YOU:  °· Understand these instructions. °· Will watch your condition. °· Will get help right away if you are not doing well or get worse. °Document Released: 04/24/2005 Document Revised: 04/29/2013 Document Reviewed: 09/27/2012 °ExitCare® Patient Information ©2015 [employer]. This information is not intended to replace advice given to you by your health  care provider. Make sure you discuss any questions you have with your health care provider. ° °

## 2014-07-07 NOTE — Progress Notes (Signed)
CSW met with pt at bedside. There was no family present. Pt confirms that he presents to [facility] due to falling. Pt states that his fall was caused due to feeling light headed.  Pt states that he is able to complete his ADL's independently. He informed CSW that he lives at home with his wife and grand daughter. He states that his wife and grand daughter are a good support.   Pt states that he is not interested in a facility at this time. Pt states that he feels a lot better than when he initially came into the ED. Also, pt informed CSW that his chest is not in pain anymore.  Willette Brace [phone] ED CSW 07/07/2014 5:53 PM

## 2014-07-07 NOTE — Progress Notes (Signed)
EDCM spoke to patient and his family at bedside.  Patient on the phone, patient's wife answered questions.  Patient's wife reports patient lives at home with her.  Patient is currently under the care of the [facility] and [facility].  Patient's wife is very pleased with this hospice agency.  Patient has a chair lift for the stairs, walker, bsc, and shower chair at home.  Patient reports he dresses himself everyday and takes a shower every other day on his own.  Patient and family without home health needs at this time.  No further EDCM needs at this time.

## 2014-07-09 ENCOUNTER — Ambulatory Visit (HOSPITAL_COMMUNITY): Admitting: Anesthesiology

## 2014-07-09 ENCOUNTER — Ambulatory Visit (HOSPITAL_COMMUNITY)
Admission: RE | Admit: 2014-07-09 | Discharge: 2014-07-09 | Disposition: A | Discharge status: Home / Self Care | Source: Ambulatory Visit | Attending: Gastroenterology | Admitting: Gastroenterology

## 2014-07-09 ENCOUNTER — Encounter (HOSPITAL_COMMUNITY): Payer: Self-pay

## 2014-07-09 ENCOUNTER — Telehealth: Payer: Self-pay | Admitting: Gastroenterology

## 2014-07-09 ENCOUNTER — Ambulatory Visit (HOSPITAL_COMMUNITY)

## 2014-07-09 ENCOUNTER — Encounter (HOSPITAL_COMMUNITY)
Admission: RE | Disposition: A | Discharge status: Home / Self Care | Payer: Self-pay | Source: Ambulatory Visit | Attending: Gastroenterology

## 2014-07-09 DIAGNOSIS — Z8501 Personal history of malignant neoplasm of esophagus: Secondary | ICD-10-CM | POA: Diagnosis not present

## 2014-07-09 DIAGNOSIS — Z8673 Personal history of transient ischemic attack (TIA), and cerebral infarction without residual deficits: Secondary | ICD-10-CM | POA: Diagnosis not present

## 2014-07-09 DIAGNOSIS — Z888 Allergy status to other drugs, medicaments and biological substances status: Secondary | ICD-10-CM | POA: Insufficient documentation

## 2014-07-09 DIAGNOSIS — I739 Peripheral vascular disease, unspecified: Secondary | ICD-10-CM | POA: Insufficient documentation

## 2014-07-09 DIAGNOSIS — E785 Hyperlipidemia, unspecified: Secondary | ICD-10-CM | POA: Insufficient documentation

## 2014-07-09 DIAGNOSIS — N183 Chronic kidney disease, stage 3 (moderate): Secondary | ICD-10-CM | POA: Diagnosis not present

## 2014-07-09 DIAGNOSIS — I129 Hypertensive chronic kidney disease with stage 1 through stage 4 chronic kidney disease, or unspecified chronic kidney disease: Secondary | ICD-10-CM | POA: Insufficient documentation

## 2014-07-09 DIAGNOSIS — E119 Type 2 diabetes mellitus without complications: Secondary | ICD-10-CM | POA: Insufficient documentation

## 2014-07-09 DIAGNOSIS — C7889 Secondary malignant neoplasm of other digestive organs: Secondary | ICD-10-CM | POA: Insufficient documentation

## 2014-07-09 DIAGNOSIS — K222 Esophageal obstruction: Secondary | ICD-10-CM | POA: Diagnosis not present

## 2014-07-09 DIAGNOSIS — B3781 Candidal esophagitis: Secondary | ICD-10-CM | POA: Insufficient documentation

## 2014-07-09 DIAGNOSIS — Z91041 Radiographic dye allergy status: Secondary | ICD-10-CM | POA: Diagnosis not present

## 2014-07-09 DIAGNOSIS — C787 Secondary malignant neoplasm of liver and intrahepatic bile duct: Secondary | ICD-10-CM | POA: Diagnosis not present

## 2014-07-09 DIAGNOSIS — Z87891 Personal history of nicotine dependence: Secondary | ICD-10-CM | POA: Diagnosis not present

## 2014-07-09 DIAGNOSIS — C159 Malignant neoplasm of esophagus, unspecified: Secondary | ICD-10-CM

## 2014-07-09 DIAGNOSIS — R1310 Dysphagia, unspecified: Secondary | ICD-10-CM | POA: Diagnosis present

## 2014-07-09 HISTORY — PX: ESOPHAGEAL STENT PLACEMENT: SHX5540

## 2014-07-09 HISTORY — PX: ESOPHAGOGASTRODUODENOSCOPY: SHX5428

## 2014-07-09 LAB — GLUCOSE, CAPILLARY: Glucose-Capillary: 144 mg/dL — ABNORMAL HIGH (ref 70–99)

## 2014-07-09 SURGERY — EGD (ESOPHAGOGASTRODUODENOSCOPY)
Anesthesia: Monitor Anesthesia Care

## 2014-07-09 MED ORDER — ONDANSETRON HCL 4 MG/2ML IJ SOLN
INTRAMUSCULAR | Status: DC | PRN
  Administered 2014-07-09: 4 mg via INTRAVENOUS

## 2014-07-09 MED ORDER — PROPOFOL INFUSION 10 MG/ML OPTIME
INTRAVENOUS | Status: DC | PRN
  Administered 2014-07-09: 100 ug/kg/min via INTRAVENOUS

## 2014-07-09 MED ORDER — SODIUM CHLORIDE 0.9 % IV SOLN
INTRAVENOUS | Status: DC | PRN
  Administered 2014-07-09: 50 mL

## 2014-07-09 MED ORDER — LIDOCAINE HCL (CARDIAC) 20 MG/ML IV SOLN
INTRAVENOUS | Status: DC | PRN
  Administered 2014-07-09: 25 mg via INTRATRACHEAL
  Administered 2014-07-09: 75 mg via INTRAVENOUS

## 2014-07-09 MED ORDER — PROPOFOL 10 MG/ML IV BOLUS
INTRAVENOUS | Status: AC
  Filled 2014-07-09: qty 20

## 2014-07-09 MED ORDER — FLUCONAZOLE 100 MG PO TABS: 100.0000 mg | ORAL_TABLET | Freq: Every day | ORAL | Status: DC

## 2014-07-09 MED ORDER — HEPARIN SOD (PORK) LOCK FLUSH 100 UNIT/ML IV SOLN
INTRAVENOUS | Status: AC
  Filled 2014-07-09: qty 5

## 2014-07-09 MED ORDER — SUCCINYLCHOLINE CHLORIDE 20 MG/ML IJ SOLN
INTRAMUSCULAR | Status: DC | PRN
  Administered 2014-07-09: 80 mg via INTRAVENOUS

## 2014-07-09 MED ORDER — FENTANYL CITRATE 0.05 MG/ML IJ SOLN
INTRAMUSCULAR | Status: AC
  Filled 2014-07-09: qty 2

## 2014-07-09 MED ORDER — PROPOFOL 10 MG/ML IV BOLUS
INTRAVENOUS | Status: DC | PRN
  Administered 2014-07-09: 40 mg via INTRAVENOUS
  Administered 2014-07-09: 80 mg via INTRAVENOUS

## 2014-07-09 MED ORDER — FLUCONAZOLE 100 MG PO TABS: 100.0000 mg | ORAL_TABLET | Freq: Every day | ORAL | Status: AC

## 2014-07-09 MED ORDER — ACETAMINOPHEN 10 MG/ML IV SOLN: INTRAVENOUS | Status: DC | PRN

## 2014-07-09 MED ORDER — HEPARIN SOD (PORK) LOCK FLUSH 100 UNIT/ML IV SOLN
500.0000 [IU] | Freq: Once | INTRAVENOUS | Status: AC
  Administered 2014-07-09: 500 [IU]

## 2014-07-09 MED ORDER — LIDOCAINE HCL (CARDIAC) 20 MG/ML IV SOLN
INTRAVENOUS | Status: AC
  Filled 2014-07-09: qty 5

## 2014-07-09 MED ORDER — LACTATED RINGERS IV SOLN
INTRAVENOUS | Status: DC | PRN
  Administered 2014-07-09: 10:00:00 via INTRAVENOUS

## 2014-07-09 MED ORDER — FENTANYL CITRATE 0.05 MG/ML IJ SOLN
50.0000 ug | Freq: Once | INTRAMUSCULAR | Status: AC
  Administered 2014-07-09: 50 ug via INTRAVENOUS

## 2014-07-09 MED ORDER — FENTANYL CITRATE 0.05 MG/ML IJ SOLN
INTRAMUSCULAR | Status: DC | PRN
  Administered 2014-07-09: 50 ug via INTRAVENOUS
  Administered 2014-07-09 (×2): 25 ug via INTRAVENOUS

## 2014-07-09 MED ORDER — SODIUM CHLORIDE 0.9 % IV SOLN: INTRAVENOUS | Status: DC

## 2014-07-09 MED ORDER — ONDANSETRON HCL 4 MG/2ML IJ SOLN
INTRAMUSCULAR | Status: AC
  Filled 2014-07-09: qty 2

## 2014-07-09 MED ORDER — HYDROCODONE-ACETAMINOPHEN 5-300 MG PO TABS: 1.0000 | ORAL_TABLET | Freq: Four times a day (QID) | ORAL | Status: AC | PRN

## 2014-07-09 MED ORDER — LABETALOL HCL 5 MG/ML IV SOLN
INTRAVENOUS | Status: DC | PRN
  Administered 2014-07-09: 5 mg via INTRAVENOUS
  Administered 2014-07-09 (×2): 2.5 mg via INTRAVENOUS

## 2014-07-09 NOTE — Transfer of Care (Signed)
Immediate Anesthesia Transfer of Care Note  Patient: [name]  Procedure(s) Performed: Procedure(s): ESOPHAGOGASTRODUODENOSCOPY (EGD) (N/A) ESOPHAGEAL STENT PLACEMENT (N/A)  Patient Location: PACU  Anesthesia Type:General  Level of Consciousness: awake, alert , oriented and patient cooperative  Airway & Oxygen Therapy: Patient Spontanous Breathing and Patient connected to face mask oxygen  Post-op Assessment: Report given to RN, Post -op Vital signs reviewed and stable and Patient moving all extremities X 4  Post vital signs: stable  Last Vitals:  Filed Vitals:   07/09/14 1104  BP: 163/116  Pulse: 80  Temp: 36.7 C  Resp: 21    Complications: No apparent anesthesia complications

## 2014-07-09 NOTE — Telephone Encounter (Signed)
Med called in, diflucan

## 2014-07-09 NOTE — Anesthesia Postprocedure Evaluation (Signed)
  Anesthesia Post-op Note  Patient: [name]  Procedure(s) Performed: Procedure(s): ESOPHAGOGASTRODUODENOSCOPY (EGD) (N/A) ESOPHAGEAL STENT PLACEMENT (N/A)  Patient Location: PACU  Anesthesia Type:General  Level of Consciousness: awake and alert   Airway and Oxygen Therapy: Patient Spontanous Breathing  Post-op Pain: none  Post-op Assessment: Post-op Vital signs reviewed, Patient's Cardiovascular Status Stable, Respiratory Function Stable, Patent Airway, No signs of Nausea or vomiting and Pain level controlled  Post-op Vital Signs: Reviewed and stable  Last Vitals:  Filed Vitals:   07/09/14 1230  BP: 208/70  Pulse: 81  Temp:   Resp: 32    Complications: No apparent anesthesia complications

## 2014-07-09 NOTE — Interval H&P Note (Signed)
History and Physical Interval Note:  07/09/2014 9:23 AM  [name]  has presented today for surgery, with the diagnosis of [email or web address]/  The various methods of treatment have been discussed with the patient and family. After consideration of risks, benefits and other options for treatment, the patient has consented to  Procedure(s): ESOPHAGOGASTRODUODENOSCOPY (EGD) (N/A) ESOPHAGEAL STENT PLACEMENT (N/A) as a surgical intervention .  The patient's history has been reviewed, patient examined, no change in status, stable for surgery.  I have reviewed the patient's chart and labs.  Questions were answered to the patient's satisfaction.     Milus Banister

## 2014-07-09 NOTE — Anesthesia Preprocedure Evaluation (Signed)
Anesthesia Evaluation  Patient identified by MRN, date of birth, ID band Patient awake    History of Anesthesia Complications Negative for: history of anesthetic complications  Airway Mallampati: I  TM Distance: >3 FB Neck ROM: Full    Dental  (+) Teeth Intact,    Pulmonary shortness of breath, neg sleep apnea, neg COPDneg recent URI, former smoker,  breath sounds clear to auscultation        Cardiovascular hypertension, + Peripheral Vascular Disease Rhythm:Regular     Neuro/Psych PSYCHIATRIC DISORDERS CVA, No Residual Symptoms    GI/Hepatic H/o esophageal Ca with stricture   Endo/Other  diabetes, Type 2  Renal/GU      Musculoskeletal  (+) Arthritis -,   Abdominal   Peds  Hematology negative hematology ROS (+)   Anesthesia Other Findings   Reproductive/Obstetrics                             Anesthesia Physical Anesthesia Plan  ASA: III  Anesthesia Plan: MAC   Post-op Pain Management:    Induction: Intravenous  Airway Management Planned: Natural Airway  Additional Equipment:   Intra-op Plan:   Post-operative Plan:   Informed Consent: I have reviewed the patients History and Physical, chart, labs and discussed the procedure including the risks, benefits and alternatives for the proposed anesthesia with the patient or authorized representative who has indicated his/her understanding and acceptance.   Dental advisory given  Plan Discussed with: CRNA and Surgeon  Anesthesia Plan Comments:         Anesthesia Quick Evaluation

## 2014-07-09 NOTE — Discharge Instructions (Signed)

## 2014-07-09 NOTE — H&P (View-Only) (Signed)
Patient ID: [name], male   DOB: 1929/09/02, 81 y.o.   MRN: [id number]     History of Present Illness:   [name] is an 81 year old male who had an EGD by Dr. Ardis Hughs on 01/31/2014, at which time he was found to have an ulcerated, circumferential 2-3 cm mass at the GE junction causing an incomplete stricture. Biopsy showed invasive poorly differentiated adenocarcinoma with no amplification of her-2. CT of the abdomen and pelvis on 01/30/2014 showed new hypodensities in the liver concerning for metastatic disease. PET scan on 03/04/2014 confirmed a hypermetabolic GE junction mass, numerous hypermetabolic liver metastases, and upper abdominal/retroperitoneal nodal metastases. He has completed a cycle of chemotherapy. He was evaluated by Dr. Benay Spice of oncology yesterday and was noted to be losing weight. Patient states he has been spitting a lot. He has no difficulty swallowing soft foods like applesauce, yogurt, and soup but has difficulty with solid foods and has intermittent difficulty swallowing liquids. He does not feel liquids are pooling in the back of his throat, nor does he feel food wants to go down the wrong pipe. He is spitting intermittently into what vomit basing in the office. Further questioning reveals that he has had a slight headache and he points to the frontal sinus area with some congestion, intermittent sneezing, and postnasal drip. He does have Flonase at home but has not been using it.   Past Medical History  Diagnosis Date  . Carotid stenosis     u/s 5/11: R 0-39% L 40-59% (stable)  . HTN (hypertension)     refractory. unable to cardura in past due to hypotension  . Edema   . PAD (peripheral artery disease)   . Cerebrovascular disease, unspecified   . HLD (hyperlipidemia)   . Bradycardia   . Diabetes mellitus type II   . Hyperkalemia   . Shortness of breath   . Arthritis     " IN MY HANDS "  . Chronic kidney disease, stage III (moderate)   . Stroke   . Cancer    esophageal.   . History of recent fall 03/27/14    [facility] [place] called    Past Surgical History  Procedure Laterality Date  . Carotid endarterectomy  1/01    right  . Balloon angioplasty, artery Bilateral 04/09/2013    LEFT ILIAC & RT COMMON ILIAC     DR ARIDA    . Femoral artery stent  ?   2010    DR COOPER  . Esophagogastroduodenoscopy N/A 01/31/2014    Procedure: ESOPHAGOGASTRODUODENOSCOPY (EGD);  Surgeon: Milus Banister, MD;  Location: [facility];  Service: Endoscopy;  Laterality: N/A;  . Abdominal aortagram N/A 04/09/2013    Procedure: ABDOMINAL AORTAGRAM;  Surgeon: Wellington Hampshire, MD;  Location: [facility] CATH LAB;  Service: Cardiovascular;  Laterality: N/A;  . Lower extremity angiogram Bilateral 04/10/2013    Procedure: [facility] STENT;  Surgeon: Wellington Hampshire, MD;  Location: [facility] CATH LAB;  Service: Cardiovascular;  Laterality: Bilateral;  . Abdominal aortagram N/A 05/21/2013    Procedure: ABDOMINAL Maxcine Ham;  Surgeon: Wellington Hampshire, MD;  Location: [facility] CATH LAB;  Service: Cardiovascular;  Laterality: N/A;   Family History  Problem Relation Age of Onset  . Coronary artery disease Father    History  Substance Use Topics  . Smoking status: Former Smoker    Quit date: 05/08/1994  . Smokeless tobacco: Never Used  . Alcohol Use: No   Current Outpatient Prescriptions  Medication Sig Dispense  Refill  . feeding supplement, GLUCERNA SHAKE, (GLUCERNA SHAKE) LIQD Take 237 mLs by mouth 3 (three) times daily between meals. 237 mL 0  . HYDROcodone-acetaminophen (HYCET) 7.5-325 mg/15 ml solution Take 10-15 mLs by mouth every 6 (six) hours as needed for moderate pain. 480 mL 0  . megestrol (MEGACE) 40 MG/ML suspension Take 5 mLs (200 mg total) by mouth 2 (two) times daily. 300 mL 0  . polyethylene glycol powder (GLYCOLAX/MIRALAX) powder Mix 17 Grams daily.  Mix powder with 8 oz beverage of choice and drink 500 g 0  . PRESCRIPTION MEDICATION Chemo [facility]    . senna-docusate  (SENOKOT-S) 8.6-50 MG per tablet Take 1 tablet by mouth 2 (two) times daily.    . sorbitol 70 % SOLN Take 30 mLs by mouth daily. 1000 mL 1  . spironolactone (ALDACTONE) 25 MG tablet Take 1 tablet (25 mg total) by mouth 2 (two) times daily. 60 tablet 0  . traMADol (ULTRAM) 50 MG tablet Take 1 tablet (50 mg total) by mouth every 12 (twelve) hours as needed for moderate pain. 60 tablet 3   No current facility-administered medications for this visit.   Facility-Administered Medications Ordered in Other Visits  Medication Dose Route Frequency Provider Last Rate Last Dose  . sodium chloride 0.9 % injection 10 mL  10 mL Intracatheter PRN Ladell Pier, MD   10 mL at 03/11/14 1442   Allergies  Allergen Reactions  . Contrast Media [Iodinated Diagnostic Agents] Other (See Comments)    Reaction:  History of renal failure following contrast requiring temporary dialysis.    . Metformin Hcl Other (See Comments)    Reaction:  Renal failure----Pt states that "metformin will kill me".  . Aliskiren Rash  . Doxazosin Mesylate Rash      Review of Systems: Gen: Denies any fever, chills, sweats, anorexia, fatigue, weakness, malaise, weight loss, and sleep disorder CV: Denies chest pain, angina, palpitations, syncope, orthopnea, PND, peripheral edema, and claudication. Resp: Denies dyspnea at rest, dyspnea with exercise, cough, sputum, wheezing, coughing up blood, and pleurisy. GI: Denies vomiting blood, jaundice, and fecal incontinence.   Has ysphagia to solids and some liquids. GU : Denies urinary burning, blood in urine, urinary frequency, urinary hesitancy, nocturnal urination, and urinary incontinence. MS: Denies joint pain, limitation of movement, and swelling, stiffness, low back pain, extremity pain. Denies muscle weakness, cramps, atrophy.  Derm: Denies rash, itching, dry skin, hives, moles, warts, or unhealing ulcers.  Psych: Denies depression, anxiety, memory loss, suicidal ideation,  hallucinations, paranoia, and confusion. Heme: Denies bruising, bleeding, and enlarged lymph nodes. Neuro:  Denies any headaches, dizziness, paresthesia Endo:  Denies any problems with DM, thyroid, adrenal  LAB RESULTS:  Recent Labs  06/29/14 1433  WBC 5.2  HGB 12.5*  HCT 37.8*  PLT 149   BMET  Recent Labs  06/29/14 1433  NA 142  K 4.3  CO2 26  GLUCOSE 139  BUN 22.4  CREATININE 1.1  CALCIUM 9.4   LFT  Recent Labs  06/29/14 1433  PROT 5.9*  ALBUMIN 3.0*  AST 24  ALT 21  ALKPHOS 321*  BILITOT 0.69     Physical Exam: General: Pleasant, thin,male in no acute distress Head: Normocephalic and atraumatic Eyes:  sclerae anicteric, conjunctiva pink  Ears: Normal auditory acuity Lungs: Clear throughout to auscultation Heart: Regular rate and rhythm Abdomen: Soft, non distended, non-tender. No masses, no hepatomegaly. Normal bowel sounds Musculoskeletal: Symmetrical with no gross deformities  Extremities: No edema  Neurological: Alert oriented  x 4, grossly nonfocal Psychological:  Alert and cooperative. Normal mood and affect  Assessment and Recommendations:  81 year old male with esophageal adenocarcinoma experiencing progressive dysphagia referred for evaluation. He seems to be developing an esophageal stricture. The patient plans to enroll in [facility]. At this point is to schedule him for an EGD with placement of an esophageal stent for palliation. This will be performed next week at [facility] by Dr. Ardis Hughs. In the meantime the patient has been instructed to use his Flonase to see if it will decrease his postnasal drip and decrease some of his spitting. He has been instructed to remain on a soft or pured diet.   [name], Deloris Ping 06/30/2014,  CC:Dr. Benay Spice

## 2014-07-09 NOTE — Op Note (Signed)
[place], [id number]   ENDOSCOPY PROCEDURE REPORT  PATIENT: [name], [name]  MR#: [id number] BIRTHDATE: August 17, 1929 , 74  yrs. old GENDER: male ENDOSCOPIST: Milus Banister, MD PROCEDURE DATE:  07/09/2014 PROCEDURE:  EGD w/ transendoscopic stent ASA CLASS:     Class III INDICATIONS:  metastatic GE junction adeno, dysphagia. MEDICATIONS: Per Anesthesia TOPICAL ANESTHETIC: none  DESCRIPTION OF PROCEDURE: After the risks benefits and alternatives of the procedure were thoroughly explained, informed consent was obtained.  The Pentax Gastroscope [id number] endoscope was introduced through the mouth and advanced to the esophagus lower , Without limitations.  The instrument was slowly withdrawn as the mucosa was fully examined.  There was manilia esophagus above a clearly malignant stricture. The proximal edge of the stricture was at 36cm from the incisors. Gastroscope was not able to pass through the 1-31mm lumen stricture. A biliary balloon, biliary contast over a biliary wire was used to define the stricture and found it to be 4cm long.  A 63mm diameter, 158mm lenght, partially covered metal Wallflex stent was deployed over the wire using fluoroscopy in good position bridging the stricture.  Retroflexion was not performed.     The scope was then withdrawn from the patient and the procedure completed.  COMPLICATIONS: There were no immediate complications.  ENDOSCOPIC IMPRESSION: There was manilia esophagus above a clearly malignant stricture. The proximal edge of the stricture was at 36cm from the incisors. Gastroscope was not able to pass through the 1-33mm lumen stricture. A biliary balloon, biliary contast over a biliary wire was used to define the stricture and found it to be 4cm long.  A 55mm diameter, 149mm length, partially covered metal Wallflex stent was deployed over the wire using fluoroscopy.  Final films showed the stent nicely  bridging the stricture with 4cm of stent proximal to the stricture.  RECOMMENDATIONS: Follow clinically.  He understands that he should not advance his diet past puree type consistency. Will prescribe diflucan 100mg  pill, he will take one pill daily for 10 days.   eSigned:  Milus Banister, MD 07/09/2014 10:55 AM    CC: Julieanne Manson, MD

## 2014-07-10 ENCOUNTER — Encounter (HOSPITAL_COMMUNITY): Payer: Self-pay | Admitting: Gastroenterology

## 2014-07-13 ENCOUNTER — Ambulatory Visit: Payer: Medicare Other | Admitting: Oncology

## 2014-07-14 NOTE — Telephone Encounter (Signed)
Please advise Dr Jacobs 

## 2014-07-19 ENCOUNTER — Encounter: Payer: Self-pay | Admitting: Oncology

## 2014-07-20 ENCOUNTER — Encounter: Payer: Self-pay | Admitting: Oncology

## 2014-07-20 ENCOUNTER — Encounter: Payer: Self-pay | Admitting: Nurse Practitioner

## 2014-07-21 ENCOUNTER — Encounter: Payer: Self-pay | Admitting: Nurse Practitioner

## 2014-07-21 ENCOUNTER — Encounter: Payer: Self-pay | Admitting: Oncology

## 2014-07-21 ENCOUNTER — Telehealth: Payer: Self-pay | Admitting: *Deleted

## 2014-07-21 NOTE — Telephone Encounter (Signed)
AT 4:17PM RECEIVED A VOICE MAIL FROM DONNA NEALE CONCERNING THE STATUS OF PT.'S FLMA FORM. PHONE [phone].

## 2014-07-21 NOTE — Telephone Encounter (Signed)
Signed FMLA papers returned to [employer] in [place].

## 2014-07-21 NOTE — Telephone Encounter (Signed)
Received signed FMLA papers back from MD; notified pt's daughter Magdalene Patricia that papers can be picked up tomorrow.  [name] verbalized understanding and states "my brother or I will be there tomorrow to pick up"

## 2014-07-21 NOTE — Progress Notes (Signed)
I placed the fmla forms for son Garwood Wentzell on the desk of nurse for Dr. Benay Spice.

## 2014-07-22 ENCOUNTER — Encounter: Payer: Self-pay | Admitting: Oncology

## 2014-07-22 NOTE — Progress Notes (Signed)
I called [name] to let her know the fmla/disabl forms(for Arnold Kester) are ready for pick up at front desk. Copy on file with me.

## 2014-07-22 NOTE — Telephone Encounter (Signed)
07-22-2014 1716 this nurse noted documenatation from [place] that FMLA form is ready for pick up and will close this encounter.

## 2014-07-24 ENCOUNTER — Encounter: Payer: Self-pay | Admitting: Oncology

## 2014-07-26 ENCOUNTER — Emergency Department (HOSPITAL_COMMUNITY)

## 2014-07-26 ENCOUNTER — Emergency Department (HOSPITAL_COMMUNITY)
Admission: EM | Admit: 2014-07-26 | Discharge: 2014-07-26 | Disposition: A | Discharge status: Home / Self Care | Attending: Emergency Medicine | Admitting: Emergency Medicine

## 2014-07-26 ENCOUNTER — Encounter (HOSPITAL_COMMUNITY): Payer: Self-pay | Admitting: *Deleted

## 2014-07-26 DIAGNOSIS — E119 Type 2 diabetes mellitus without complications: Secondary | ICD-10-CM | POA: Insufficient documentation

## 2014-07-26 DIAGNOSIS — Z79899 Other long term (current) drug therapy: Secondary | ICD-10-CM | POA: Diagnosis not present

## 2014-07-26 DIAGNOSIS — N3000 Acute cystitis without hematuria: Secondary | ICD-10-CM | POA: Diagnosis not present

## 2014-07-26 DIAGNOSIS — M1990 Unspecified osteoarthritis, unspecified site: Secondary | ICD-10-CM | POA: Insufficient documentation

## 2014-07-26 DIAGNOSIS — Y9389 Activity, other specified: Secondary | ICD-10-CM | POA: Diagnosis not present

## 2014-07-26 DIAGNOSIS — Y92009 Unspecified place in unspecified non-institutional (private) residence as the place of occurrence of the external cause: Secondary | ICD-10-CM

## 2014-07-26 DIAGNOSIS — W19XXXA Unspecified fall, initial encounter: Secondary | ICD-10-CM

## 2014-07-26 DIAGNOSIS — N289 Disorder of kidney and ureter, unspecified: Secondary | ICD-10-CM

## 2014-07-26 DIAGNOSIS — E871 Hypo-osmolality and hyponatremia: Secondary | ICD-10-CM | POA: Diagnosis not present

## 2014-07-26 DIAGNOSIS — Y92129 Unspecified place in nursing home as the place of occurrence of the external cause: Secondary | ICD-10-CM

## 2014-07-26 DIAGNOSIS — Z87891 Personal history of nicotine dependence: Secondary | ICD-10-CM | POA: Insufficient documentation

## 2014-07-26 DIAGNOSIS — Z043 Encounter for examination and observation following other accident: Secondary | ICD-10-CM | POA: Diagnosis not present

## 2014-07-26 DIAGNOSIS — Z8673 Personal history of transient ischemic attack (TIA), and cerebral infarction without residual deficits: Secondary | ICD-10-CM | POA: Diagnosis not present

## 2014-07-26 DIAGNOSIS — Z8501 Personal history of malignant neoplasm of esophagus: Secondary | ICD-10-CM | POA: Insufficient documentation

## 2014-07-26 DIAGNOSIS — D649 Anemia, unspecified: Secondary | ICD-10-CM | POA: Insufficient documentation

## 2014-07-26 DIAGNOSIS — E860 Dehydration: Secondary | ICD-10-CM | POA: Diagnosis not present

## 2014-07-26 DIAGNOSIS — R400 Somnolence: Secondary | ICD-10-CM | POA: Insufficient documentation

## 2014-07-26 DIAGNOSIS — I129 Hypertensive chronic kidney disease with stage 1 through stage 4 chronic kidney disease, or unspecified chronic kidney disease: Secondary | ICD-10-CM | POA: Insufficient documentation

## 2014-07-26 DIAGNOSIS — N183 Chronic kidney disease, stage 3 (moderate): Secondary | ICD-10-CM | POA: Insufficient documentation

## 2014-07-26 DIAGNOSIS — Y998 Other external cause status: Secondary | ICD-10-CM | POA: Diagnosis not present

## 2014-07-26 DIAGNOSIS — Y92128 Other place in nursing home as the place of occurrence of the external cause: Secondary | ICD-10-CM | POA: Diagnosis not present

## 2014-07-26 DIAGNOSIS — W1839XA Other fall on same level, initial encounter: Secondary | ICD-10-CM | POA: Diagnosis not present

## 2014-07-26 LAB — URINALYSIS, ROUTINE W REFLEX MICROSCOPIC
Bilirubin Urine: NEGATIVE
Glucose, UA: NEGATIVE mg/dL
KETONES UR: NEGATIVE mg/dL
Nitrite: POSITIVE — AB
Protein, ur: 30 mg/dL — AB
Specific Gravity, Urine: 1.01 (ref 1.005–1.030)
UROBILINOGEN UA: 1 mg/dL (ref 0.0–1.0)
pH: 5 (ref 5.0–8.0)

## 2014-07-26 LAB — BASIC METABOLIC PANEL
ANION GAP: 15 (ref 5–15)
BUN: 44 mg/dL — ABNORMAL HIGH (ref 6–23)
CALCIUM: 8.9 mg/dL (ref 8.4–10.5)
CO2: 23 mmol/L (ref 19–32)
Chloride: 94 mmol/L — ABNORMAL LOW (ref 96–112)
Creatinine, Ser: 2.42 mg/dL — ABNORMAL HIGH (ref 0.50–1.35)
GFR, EST AFRICAN AMERICAN: 27 mL/min — AB (ref >90)
GFR, EST NON AFRICAN AMERICAN: 23 mL/min — AB (ref >90)
Glucose, Bld: 159 mg/dL — ABNORMAL HIGH (ref 70–99)
Potassium: 4.3 mmol/L (ref 3.5–5.1)
Sodium: 132 mmol/L — ABNORMAL LOW (ref 135–145)

## 2014-07-26 LAB — CBC WITH DIFFERENTIAL/PLATELET
BASOS PCT: 0 % (ref 0–1)
Basophils Absolute: 0 10*3/uL (ref 0.0–0.1)
EOS PCT: 0 % (ref 0–5)
Eosinophils Absolute: 0 10*3/uL (ref 0.0–0.7)
HCT: 33.6 % — ABNORMAL LOW (ref 39.0–52.0)
HEMOGLOBIN: 11.3 g/dL — AB (ref 13.0–17.0)
Lymphocytes Relative: 5 % — ABNORMAL LOW (ref 12–46)
Lymphs Abs: 0.8 10*3/uL (ref 0.7–4.0)
MCH: 29.8 pg (ref 26.0–34.0)
MCHC: 33.6 g/dL (ref 30.0–36.0)
MCV: 88.7 fL (ref 78.0–100.0)
MONOS PCT: 2 % — AB (ref 3–12)
Monocytes Absolute: 0.3 10*3/uL (ref 0.1–1.0)
NEUTROS ABS: 14.2 10*3/uL — AB (ref 1.7–7.7)
Neutrophils Relative %: 93 % — ABNORMAL HIGH (ref 43–77)
PLATELETS: 171 10*3/uL (ref 150–400)
RBC: 3.79 MIL/uL — AB (ref 4.22–5.81)
RDW: 14.5 % (ref 11.5–15.5)
WBC: 15.3 10*3/uL — AB (ref 4.0–10.5)

## 2014-07-26 LAB — URINE MICROSCOPIC-ADD ON

## 2014-07-26 MED ORDER — SODIUM CHLORIDE 0.9 % IV SOLN
1000.0000 mL | INTRAVENOUS | Status: DC
  Administered 2014-07-26: 1000 mL via INTRAVENOUS

## 2014-07-26 MED ORDER — SODIUM CHLORIDE 0.9 % IV BOLUS (SEPSIS): 2000.0000 mL | Freq: Once | INTRAVENOUS | Status: DC

## 2014-07-26 MED ORDER — CEPHALEXIN 500 MG PO CAPS
500.0000 mg | ORAL_CAPSULE | Freq: Once | ORAL | Status: AC
  Administered 2014-07-26: 500 mg via ORAL
  Filled 2014-07-26: qty 1

## 2014-07-26 MED ORDER — SODIUM CHLORIDE 0.9 % IV SOLN
1000.0000 mL | Freq: Once | INTRAVENOUS | Status: AC
  Administered 2014-07-26: 1000 mL via INTRAVENOUS

## 2014-07-26 MED ORDER — CEPHALEXIN 500 MG PO CAPS: 500.0000 mg | ORAL_CAPSULE | Freq: Three times a day (TID) | ORAL | Status: AC

## 2014-07-26 NOTE — ED Notes (Signed)
Daughter and wife contacted. Informed about order for antibiotic.

## 2014-07-26 NOTE — ED Notes (Signed)
Patient was very difficult when obtaining vital signs, patient allowed bp, pulse rate, oxygen, and respirations to be taken but would not open mouth for temp. I managed to get the probe in the patient's mouth once and the patient grabbed it and threw it.

## 2014-07-26 NOTE — ED Notes (Signed)
Bed: KW40 Expected date:  Expected time:  Means of arrival:  Comments: 81 yo M  Fall

## 2014-07-26 NOTE — ED Notes (Signed)
[facility] called  

## 2014-07-26 NOTE — Discharge Instructions (Signed)
Please drink as much liquid as you can. Even with this, you may need additional IV fluids.  Dehydration, Adult Dehydration is when you lose more fluids from the body than you take in. Vital organs like the kidneys, brain, and heart cannot function without a proper amount of fluids and salt. Any loss of fluids from the body can cause dehydration.  CAUSES   Vomiting.  Diarrhea.  Excessive sweating.  Excessive urine output.  Fever. SYMPTOMS  Mild dehydration  Thirst.  Dry lips.  Slightly dry mouth. Moderate dehydration  Very dry mouth.  Sunken eyes.  Skin does not bounce back quickly when lightly pinched and released.  Dark urine and decreased urine production.  Decreased tear production.  Headache. Severe dehydration  Very dry mouth.  Extreme thirst.  Rapid, weak pulse (more than 100 beats per minute at rest).  Cold hands and feet.  Not able to sweat in spite of heat and temperature.  Rapid breathing.  Blue lips.  Confusion and lethargy.  Difficulty being awakened.  Minimal urine production.  No tears. DIAGNOSIS  Your caregiver will diagnose dehydration based on your symptoms and your exam. Blood and urine tests will help confirm the diagnosis. The diagnostic evaluation should also identify the cause of dehydration. TREATMENT  Treatment of mild or moderate dehydration can often be done at home by increasing the amount of fluids that you drink. It is best to drink small amounts of fluid more often. Drinking too much at one time can make vomiting worse. Refer to the home care instructions below. Severe dehydration needs to be treated at the hospital where you will probably be given intravenous (IV) fluids that contain water and electrolytes. HOME CARE INSTRUCTIONS   Ask your caregiver about specific rehydration instructions.  Drink enough fluids to keep your urine clear or pale yellow.  Drink small amounts frequently if you have nausea and  vomiting.  Eat as you normally do.  Avoid:  Foods or drinks high in sugar.  Carbonated drinks.  Juice.  Extremely hot or cold fluids.  Drinks with caffeine.  Fatty, greasy foods.  Alcohol.  Tobacco.  Overeating.  Gelatin desserts.  Wash your hands well to avoid spreading bacteria and viruses.  Only take over-the-counter or prescription medicines for pain, discomfort, or fever as directed by your caregiver.  Ask your caregiver if you should continue all prescribed and over-the-counter medicines.  Keep all follow-up appointments with your caregiver. SEEK MEDICAL CARE IF:  You have abdominal pain and it increases or stays in one area (localizes).  You have a rash, stiff neck, or severe headache.  You are irritable, sleepy, or difficult to awaken.  You are weak, dizzy, or extremely thirsty. SEEK IMMEDIATE MEDICAL CARE IF:   You are unable to keep fluids down or you get worse despite treatment.  You have frequent episodes of vomiting or diarrhea.  You have blood or green matter (bile) in your vomit.  You have blood in your stool or your stool looks black and tarry.  You have not urinated in 6 to 8 hours, or you have only urinated a small amount of very dark urine.  You have a fever.  You faint. MAKE SURE YOU:   Understand these instructions.  Will watch your condition.  Will get help right away if you are not doing well or get worse. Document Released: 04/24/2005 Document Revised: 07/17/2011 Document Reviewed: 12/12/2010 [facility] Patient Information 2015 [place], [place]. This information is not intended to replace advice given to  you by your health care provider. Make sure you discuss any questions you have with your health care provider.

## 2014-07-26 NOTE — ED Provider Notes (Signed)
CSN: [id number]     Arrival date & time 07/26/14  0440 History   First MD Initiated Contact with Patient 07/26/14 [phone]     Chief Complaint  Patient presents with  . Fall     (Consider location/radiation/quality/duration/timing/severity/associated sxs/prior Treatment) Patient is a 81 y.o. male presenting with fall. The history is provided by the patient. The history is limited by the condition of the patient (Marked weakness, inability to answer questions).  Fall  He is reported to have had multiple falls today. He tells me that he fell but cannot give me any details and cannot tell me if anything got injured. He cannot describe mechanism of fall. On review of past records, he has known metastatic esophageal cancer and is on hospice and palliative care. He is reported to have told EMS that he had pain in his lower back.  Past Medical History  Diagnosis Date  . Carotid stenosis     u/s 5/11: R 0-39% L 40-59% (stable)  . HTN (hypertension)     refractory. unable to cardura in past due to hypotension  . Edema   . PAD (peripheral artery disease)   . Cerebrovascular disease, unspecified   . HLD (hyperlipidemia)   . Bradycardia   . Diabetes mellitus type II   . Hyperkalemia   . Shortness of breath   . Arthritis     " IN MY HANDS "  . Chronic kidney disease, stage III (moderate)   . Stroke   . Cancer     esophageal.   . History of recent fall 03/27/14    [facility] [place] called   Past Surgical History  Procedure Laterality Date  . Carotid endarterectomy  1/01    right  . Balloon angioplasty, artery Bilateral 04/09/2013    LEFT ILIAC & RT COMMON ILIAC     DR ARIDA    . Femoral artery stent  ?   2010    DR COOPER  . Esophagogastroduodenoscopy N/A 01/31/2014    Procedure: ESOPHAGOGASTRODUODENOSCOPY (EGD);  Surgeon: Milus Banister, MD;  Location: [facility];  Service: Endoscopy;  Laterality: N/A;  . Abdominal aortagram N/A 04/09/2013    Procedure: ABDOMINAL AORTAGRAM;   Surgeon: Wellington Hampshire, MD;  Location: [facility] CATH LAB;  Service: Cardiovascular;  Laterality: N/A;  . Lower extremity angiogram Bilateral 04/10/2013    Procedure: [facility] STENT;  Surgeon: Wellington Hampshire, MD;  Location: [facility] CATH LAB;  Service: Cardiovascular;  Laterality: Bilateral;  . Abdominal aortagram N/A 05/21/2013    Procedure: ABDOMINAL Maxcine Ham;  Surgeon: Wellington Hampshire, MD;  Location: [facility] CATH LAB;  Service: Cardiovascular;  Laterality: N/A;  . Esophagogastroduodenoscopy N/A 07/09/2014    Procedure: ESOPHAGOGASTRODUODENOSCOPY (EGD);  Surgeon: Milus Banister, MD;  Location: [facility];  Service: Endoscopy;  Laterality: N/A;  . Esophageal stent placement N/A 07/09/2014    Procedure: ESOPHAGEAL STENT PLACEMENT;  Surgeon: Milus Banister, MD;  Location: [facility] ENDOSCOPY;  Service: Endoscopy;  Laterality: N/A;   Family History  Problem Relation Age of Onset  . Coronary artery disease Father    History  Substance Use Topics  . Smoking status: Former Smoker    Quit date: 05/08/1994  . Smokeless tobacco: Never Used  . Alcohol Use: No    Review of Systems  Unable to perform ROS: Other      Allergies  Contrast media; Metformin hcl; Aliskiren; and Doxazosin mesylate  Home Medications   Prior to Admission medications   Medication Sig Start Date End  Date Taking? Authorizing Provider  fluconazole (DIFLUCAN) 100 MG tablet Take 1 tablet (100 mg total) by mouth daily. 07/09/14   Milus Banister, MD  HYDROcodone-acetaminophen (HYCET) 7.5-325 mg/15 ml solution Take 10-15 mLs by mouth every 6 (six) hours as needed for moderate pain. 06/29/14   Ladell Pier, MD  Hydrocodone-Acetaminophen 5-300 MG TABS Take 1 capsule by mouth 4 (four) times daily as needed. 07/09/14   Milus Banister, MD   BP 134/63 mmHg  Pulse 106  Temp(Src) 98.2 F (36.8 C) (Oral)  Resp 16  SpO2 96% Physical Exam  Nursing note and vitals reviewed.  Cachectic 81 year old male, resting comfortably and in no acute distress.  Vital signs are significant for tachycardia. Oxygen saturation is 96%, which is normal. Head is normocephalic and atraumatic. PERRLA, EOMI. Oropharynx is clear. Neck is nontender without adenopathy or JVD. Stiff cervical collar is in place. Back is nontender and there is no CVA tenderness. Lungs are clear without rales, wheezes, or rhonchi. Chest is nontender. Mediport is present in the right anterior chest. Heart has regular rate and rhythm without murmur. Abdomen is soft, scaphoid, nontender without masses or hepatosplenomegaly and peristalsis is normoactive. Extremities show significant muscle wasting diffusely. Feet are cold distal to mid calf with some cyanosis present but capillary refill is only minimally delayed at 3 seconds. No evidence of trauma present. Skin is warm and dry without rash. Neurologic: He is somnolent but arousable. He is able to answer some questions but it is difficult to get him to focus and respond, cranial nerves are intact, he is generally weak, but there are no focal motor or sensory deficits. ED Course  Procedures (including critical care time) Labs Review Results for orders placed or performed during the hospital encounter of 56/31/49  Basic metabolic panel  Result Value Ref Range   Sodium 132 (L) 135 - 145 mmol/L   Potassium 4.3 3.5 - 5.1 mmol/L   Chloride 94 (L) 96 - 112 mmol/L   CO2 23 19 - 32 mmol/L   Glucose, Bld 159 (H) 70 - 99 mg/dL   BUN 44 (H) 6 - 23 mg/dL   Creatinine, Ser 2.42 (H) 0.50 - 1.35 mg/dL   Calcium 8.9 8.4 - 10.5 mg/dL   GFR calc non Af Amer 23 (L) >90 mL/min   GFR calc Af Amer 27 (L) >90 mL/min   Anion gap 15 5 - 15  CBC with Differential  Result Value Ref Range   WBC 15.3 (H) 4.0 - 10.5 K/uL   RBC 3.79 (L) 4.22 - 5.81 MIL/uL   Hemoglobin 11.3 (L) 13.0 - 17.0 g/dL   HCT 33.6 (L) 39.0 - 52.0 %   MCV 88.7 78.0 - 100.0 fL   MCH 29.8 26.0 - 34.0 pg   MCHC 33.6 30.0 - 36.0 g/dL   RDW 14.5 11.5 - 15.5 %   Platelets 171 150 - 400  K/uL   Neutrophils Relative % 93 (H) 43 - 77 %   Neutro Abs 14.2 (H) 1.7 - 7.7 K/uL   Lymphocytes Relative 5 (L) 12 - 46 %   Lymphs Abs 0.8 0.7 - 4.0 K/uL   Monocytes Relative 2 (L) 3 - 12 %   Monocytes Absolute 0.3 0.1 - 1.0 K/uL   Eosinophils Relative 0 0 - 5 %   Eosinophils Absolute 0.0 0.0 - 0.7 K/uL   Basophils Relative 0 0 - 1 %   Basophils Absolute 0.0 0.0 - 0.1 K/uL   Imaging  Review Dg Chest 1 View  07/09/2014   CLINICAL DATA:  Esophageal cancer.  Esophageal stent placement.  EXAM: CHEST  1 VIEW  COMPARISON:  None.  FLUOROSCOPY TIME:  4.4 minutes  FINDINGS: Intraoperative fluoroscopic spot radiographs obtained during esophageal stent placement have been submitted.  A metallic stent has been placed with narrowing near the GE junction.  IMPRESSION: Intraoperative fluoroscopic spot radiographs during placement of an esophageal stent.   Electronically Signed   By: Julian Hy M.D.   On: 07/09/2014 11:28   Dg Lumbar Spine Complete  07/26/2014   CLINICAL DATA:  Multiple falls occurring this morning, on hospice for esophageal cancer. Low back pain.  EXAM: LUMBAR SPINE - COMPLETE 4+ VIEW  COMPARISON:  CT of the abdomen and pelvis January 30, 2014  FINDINGS: Mild wedging of vertebral body T12 is new from prior CT. Apparent lumbar vertebral bodies appear intact, maintenance of lumbar lordosis. Moderate L5-S1 disc height loss, endplate scleroses consistent with degenerative disc better seen on prior CT. Moderate lower lumbar facet arthropathy. No pars interarticularis defects.  Aortoiliac stent graft partially imaged. At least moderate amount of retained large bowel stool partially imaged. Stent in LEFT upper quadrant partially imaged.  IMPRESSION: Bold mild wedging of vertebral body T12, this could reflect acute injury. Recommend correlation with point tenderness.  No acute lumbar spine fracture deformity or malalignment.  Partially imaged at least moderate amount of retained large bowel stool.    Electronically Signed   By: Elon Alas   On: 07/26/2014 06:28   Ct Head Wo Contrast  07/26/2014   CLINICAL DATA:  Multiple falls occurring this morning, on hospice for esophageal cancer. Neck pain.  EXAM: CT HEAD WITHOUT CONTRAST  TECHNIQUE: Contiguous axial images were obtained from the base of the skull through the vertex without intravenous contrast.  COMPARISON:  CT of the head July 07, 2014  FINDINGS: Moderate to severe ventriculomegaly, likely on the basis of global parenchymal brain volume loss as there is overall commensurate enlargement of cerebral sulci and cerebellar folia. No intraparenchymal hemorrhage, mass effect nor midline shift. Patchy supratentorial white matter hypodensities are within normal range for patient's age and though non-specific suggest sequelae of chronic small vessel ischemic disease. No acute large vascular territory infarcts. RIGHT greater than LEFT inferior frontal lobe encephalomalacia.  No abnormal extra-axial fluid collections. Basal cisterns are patent. Moderate calcific atherosclerosis of the carotid siphons.  No skull fracture. The included ocular globes and orbital contents are non-suspicious. The mastoid aircells and included paranasal sinuses are well-aerated.  IMPRESSION: No acute intracranial process.  Bifrontal encephalomalacia is likely posttraumatic.  Moderate to severe parenchymal brain volume loss. Mild white matter changes can be seen with chronic small vessel ischemic disease.   Electronically Signed   By: Elon Alas   On: 07/26/2014 06:32   Ct Head Wo Contrast  07/07/2014   CLINICAL DATA:  Occipital region pain following fall.  Dizziness  EXAM: CT HEAD WITHOUT CONTRAST  TECHNIQUE: Contiguous axial images were obtained from the base of the skull through the vertex without intravenous contrast.  COMPARISON:  March 17, 2014  FINDINGS: Moderate diffuse atrophy is stable. There is no intracranial mass, hemorrhage, extra-axial fluid collection,  or midline shift. There is evidence of a prior infarct in the inferior right frontal lobe, stable. There is mild patchy small vessel disease in the centra semiovale bilaterally. There is no new gray-white compartment lesion. The bony calvarium appears intact. The mastoid air cells are clear. There is  a retention cyst in the inferior posterior left maxillary antrum. There is mild mucosal thickening in multiple ethmoid air cells bilaterally. There is leftward deviation of the nasal septum.  IMPRESSION: Atrophy with mild periventricular small vessel disease. Prior inferior right frontal lobe infarct. No acute infarct apparent. No hemorrhage, mass, or extra-axial fluid collection. Mild paranasal sinus disease.   Electronically Signed   By: Lowella Grip III M.D.   On: 07/07/2014 13:51   Ct Angio Chest Pe W/cm &/or Wo Cm  07/07/2014   CLINICAL DATA:  Fall this morning with chest pain and shortness of breath  EXAM: CT ANGIOGRAPHY CHEST WITH CONTRAST  TECHNIQUE: Multidetector CT imaging of the chest was performed using the standard protocol during bolus administration of intravenous contrast. Multiplanar CT image reconstructions and MIPs were obtained to evaluate the vascular anatomy.  CONTRAST:  69mL OMNIPAQUE IOHEXOL 350 MG/ML SOLN  COMPARISON:  03/27/2014  FINDINGS: The lungs are well aerated bilaterally but demonstrates some patchy infiltrate within the left upper and left lower lobe with a small effusion. Some calcified granulomas are noted.  The thoracic inlet shows some calcifications and heterogeneity in the left thyroid gland. The thoracic aorta shows atherosclerotic calcification without aneurysmal dilatation or dissection. The pulmonary artery is well visualized and demonstrates a normal branching pattern. No intraluminal filling defect to suggest pulmonary emboli are identified. No significant hilar or mediastinal adenopathy is noted. Small prevascular lymph nodes are seen. The esophagus demonstrates  fluid within which may be related to distal narrowing secondary to the patient's given history of esophageal carcinoma. Some thickening of the distal esophagus is seen.  Scanning into the upper abdomen reveals scattered areas of decreased attenuation within the liver consistent with metastatic disease. These changes are stable from the prior PET-CT. Small gallstones are noted. The osseous structures show no acute abnormality. Right chest wall port is again noted.  Review of the MIP images confirms the above findings.  IMPRESSION: Recurrent left lower lobe infiltrate with associated effusion. This may be related to the patient's known underlying esophageal carcinoma. Possibility of aspiration deserve consideration as well given the amount of fluid within the esophagus.  Multiple liver metastatic lesions consistent with the given clinical history.  Cholelithiasis.  Changes in the distal esophagus consistent with the history of esophageal carcinoma.  No pulmonary emboli are identified.   Electronically Signed   By: Inez Catalina M.D.   On: 07/07/2014 14:52   Ct Cervical Spine Wo Contrast  07/26/2014   CLINICAL DATA:  Multiple falls occurring this morning, on hospice for esophageal cancer. Neck pain.  EXAM: CT CERVICAL SPINE WITHOUT CONTRAST  TECHNIQUE: Multidetector CT imaging of the cervical spine was performed without intravenous contrast. Multiplanar CT image reconstructions were also generated.  COMPARISON:  None.  FINDINGS: Cervical vertebral bodies and posterior elements are intact and aligned, maintenance of cervical lordosis. Mild to moderate C3-4 through C5-6 disc height loss, vacuum discs and uncovertebral hypertrophy consistent with degenerative change. C2-3 facets are fused, likely on degenerative basis with moderate to severe facet arthropathy. C1-2 articulation maintained with severe arthropathy. Patient is osteopenic without destructive bony lesions. Severe calcific atherosclerosis of the bilateral  carotid bulbs can result in hemodynamically significant stenosis. Heterogeneous partially calcified thyroid nodule.  No canal stenosis. Severe LEFT C3-4, C4-5 neural foraminal narrowing.  IMPRESSION: Degenerative change of the cervical spine without acute fracture or malalignment.   Electronically Signed   By: Elon Alas   On: 07/26/2014 06:38   Dg C-arm 1-60 Min-no  Report  07/09/2014   CLINICAL DATA: stent placement   C-ARM 1-60 MINUTES  Fluoroscopy was utilized by the requesting physician.  No radiographic  interpretation.    Images viewed by me.  MDM   Final diagnoses:  Fall at home, initial encounter  Fall at nursing home, initial encounter  Dehydration  Renal insufficiency  Hyponatremia  Normochromic normocytic anemia  Acute cystitis without hematuria    Falls at home with which are probably related to progression of his known esophageal cancer. He appears very weak. Review of old charts shows that he is on hospice and palliative care and had refused hospitalization when seen in the ED 2.5 weeks ago. I cannot get him to focus on off to find out if he is interested in anything other than palliative care at this point. Screening labs and x-rays will be obtained.  X-rays show no acute fracture. Laboratory workup shows evidence of significant dehydration. Creatinine is gone from 1.1-2.4 over the last 3 weeks, and urine is gone from 23-44. Patient is slightly more alert now and states that he does wish to get IV fluids in the ED but does not wish to be admitted to the hospital. Accordingly, he will be given a 2 L bolus and then discharged back to his hospice facility.  Delora Fuel, MD 30/07/62 2633

## 2014-07-26 NOTE — ED Notes (Signed)
Per EMS, patient with multiple falls that have occurred this morning Patient is currently in hospice due to esophageal cancer Patient with c/o low back pain on scene and then began to c/o neck pain en route to ED Due to c/o neck pain c-collar placed by EMS Patient in NAD on arrival

## 2014-07-26 NOTE — ED Provider Notes (Signed)
Patient has finished his IV fluids.  He does have a urinary tract infection.  Urine culture sent.  Patient be sent home with a prescription for Keflex.  Patient is palliative care and hospice would like to go home.  Renal insufficiency noted.  No additional workup as requested by the family.  Tolerating applesauce at this time  Jola Schmidt, MD 07/26/14 1026

## 2014-07-28 ENCOUNTER — Other Ambulatory Visit: Payer: Self-pay | Admitting: Radiology

## 2014-07-28 ENCOUNTER — Ambulatory Visit: Payer: TRICARE For Life (TFL) | Admitting: Nurse Practitioner

## 2014-07-28 ENCOUNTER — Encounter: Payer: Self-pay | Admitting: Nurse Practitioner

## 2014-07-28 DIAGNOSIS — I739 Peripheral vascular disease, unspecified: Secondary | ICD-10-CM

## 2014-07-29 LAB — URINE CULTURE
Colony Count: >=100000
Special Requests: NORMAL

## 2014-07-30 ENCOUNTER — Telehealth (HOSPITAL_BASED_OUTPATIENT_CLINIC_OR_DEPARTMENT_OTHER): Payer: Self-pay | Admitting: Emergency Medicine

## 2014-07-30 NOTE — ED Provider Notes (Signed)
CSN: [id number]     Arrival date & time 07/07/14  1108 History   First MD Initiated Contact with Patient 07/07/14 1120     Chief Complaint  Patient presents with  . Dizziness  . Fall     (Consider location/radiation/quality/duration/timing/severity/associated sxs/prior Treatment) HPI Comments:  Pmhx as above including metastatic esophageal cancer (has stopped treatment and is in hospice care) who presents with dizziness and fall in his home of unclear cause, possible LOC, thinks he hit his head and also had CP at rest today.  Patient is a 81 y.o. male presenting with dizziness and fall. The history is provided by the patient. The history is limited by the absence of a caregiver. No language interpreter was used.  Dizziness Quality:  Unable to specify Severity:  Moderate Onset quality:  Unable to specify Timing:  Unable to specify Progression:  Improving Chronicity:  New Context: standing up   Ineffective treatments:  None tried Associated symptoms: chest pain, syncope and weakness   Associated symptoms: no diarrhea, no headaches, no nausea, no shortness of breath and no vomiting   Risk factors comment:  Active cancer Fall Associated symptoms include chest pain. Pertinent negatives include no abdominal pain, no headaches and no shortness of breath.    Past Medical History  Diagnosis Date  . Carotid stenosis     u/s 5/11: R 0-39% L 40-59% (stable)  . HTN (hypertension)     refractory. unable to cardura in past due to hypotension  . Edema   . PAD (peripheral artery disease)   . Cerebrovascular disease, unspecified   . HLD (hyperlipidemia)   . Bradycardia   . Diabetes mellitus type II   . Hyperkalemia   . Shortness of breath   . Arthritis     " IN MY HANDS "  . Chronic kidney disease, stage III (moderate)   . Stroke   . Cancer     esophageal.   . History of recent fall 03/27/14    [facility] [place] called   Past Surgical History  Procedure Laterality Date  .  Carotid endarterectomy  1/01    right  . Balloon angioplasty, artery Bilateral 04/09/2013    LEFT ILIAC & RT COMMON ILIAC     DR ARIDA    . Femoral artery stent  ?   2010    DR COOPER  . Esophagogastroduodenoscopy N/A 01/31/2014    Procedure: ESOPHAGOGASTRODUODENOSCOPY (EGD);  Surgeon: Milus Banister, MD;  Location: [facility];  Service: Endoscopy;  Laterality: N/A;  . Abdominal aortagram N/A 04/09/2013    Procedure: ABDOMINAL AORTAGRAM;  Surgeon: Wellington Hampshire, MD;  Location: [facility] CATH LAB;  Service: Cardiovascular;  Laterality: N/A;  . Lower extremity angiogram Bilateral 04/10/2013    Procedure: [facility] STENT;  Surgeon: Wellington Hampshire, MD;  Location: [facility] CATH LAB;  Service: Cardiovascular;  Laterality: Bilateral;  . Abdominal aortagram N/A 05/21/2013    Procedure: ABDOMINAL Maxcine Ham;  Surgeon: Wellington Hampshire, MD;  Location: [facility] CATH LAB;  Service: Cardiovascular;  Laterality: N/A;  . Esophagogastroduodenoscopy N/A 07/09/2014    Procedure: ESOPHAGOGASTRODUODENOSCOPY (EGD);  Surgeon: Milus Banister, MD;  Location: [facility];  Service: Endoscopy;  Laterality: N/A;  . Esophageal stent placement N/A 07/09/2014    Procedure: ESOPHAGEAL STENT PLACEMENT;  Surgeon: Milus Banister, MD;  Location: [facility] ENDOSCOPY;  Service: Endoscopy;  Laterality: N/A;   Family History  Problem Relation Age of Onset  . Coronary artery disease Father    History  Substance  Use Topics  . Smoking status: Former Smoker    Quit date: 05/08/1994  . Smokeless tobacco: Never Used  . Alcohol Use: No    Review of Systems  Constitutional: Negative for fever, activity change, appetite change and fatigue.  HENT: Negative for congestion, facial swelling, rhinorrhea and trouble swallowing.   Eyes: Negative for photophobia and pain.  Respiratory: Negative for cough, chest tightness and shortness of breath.   Cardiovascular: Positive for chest pain and syncope. Negative for leg swelling.  Gastrointestinal: Negative for  nausea, vomiting, abdominal pain, diarrhea and constipation.  Endocrine: Negative for polydipsia and polyuria.  Genitourinary: Negative for dysuria, urgency, decreased urine volume and difficulty urinating.  Musculoskeletal: Negative for back pain and gait problem.  Skin: Negative for color change, rash and wound.  Allergic/Immunologic: Negative for immunocompromised state.  Neurological: Positive for dizziness, syncope and weakness. Negative for facial asymmetry, speech difficulty, numbness and headaches.  Psychiatric/Behavioral: Negative for confusion, decreased concentration and agitation.      Allergies  Contrast media; Metformin hcl; Aliskiren; and Doxazosin mesylate  Home Medications   Prior to Admission medications   Medication Sig Start Date End Date Taking? Authorizing Provider  HYDROcodone-acetaminophen (HYCET) 7.5-325 mg/15 ml solution Take 10-15 mLs by mouth every 6 (six) hours as needed for moderate pain. 06/29/14  Yes Ladell Pier, MD  cephALEXin (KEFLEX) 500 MG capsule Take 1 capsule (500 mg total) by mouth 3 (three) times daily. 07/26/14   Jola Schmidt, MD  fluconazole (DIFLUCAN) 100 MG tablet Take 1 tablet (100 mg total) by mouth daily. 07/09/14   Milus Banister, MD  Hydrocodone-Acetaminophen 5-300 MG TABS Take 1 capsule by mouth 4 (four) times daily as needed. 07/09/14   Milus Banister, MD   BP 165/53 mmHg  Pulse 83  Temp(Src) 98.1 F (36.7 C) (Oral)  Resp 18  SpO2 99% Physical Exam  Constitutional: He is oriented to person, place, and time. He appears cachectic. He appears ill. No distress.  HENT:  Head: Normocephalic and atraumatic.  Mouth/Throat: No oropharyngeal exudate.  Dry mucus membranes  Eyes: Pupils are equal, round, and reactive to light.  Neck: Normal range of motion. Neck supple.  Cardiovascular: Regular rhythm and normal heart sounds.  Tachycardia present.  Exam reveals no gallop and no friction rub.   No murmur heard. Pulmonary/Chest: Effort  normal and breath sounds normal. No respiratory distress. He has no wheezes. He has no rales.  Abdominal: Soft. Bowel sounds are normal. He exhibits no distension and no mass. There is no tenderness. There is no rebound and no guarding.  Musculoskeletal: Normal range of motion. He exhibits no edema or tenderness.  Neurological: He is alert and oriented to person, place, and time.  Skin: Skin is warm and dry.  Psychiatric: He has a normal mood and affect.    ED Course  Procedures (including critical care time) Labs Review Labs Reviewed  CBC - Abnormal; Notable for the following:    WBC 10.7 (*)    All other components within normal limits  BASIC METABOLIC PANEL - Abnormal; Notable for the following:    Glucose, Bld 187 (*)    GFR calc non Af Amer 61 (*)    GFR calc Af Amer 70 (*)    All other components within normal limits  PROTIME-INR - Abnormal; Notable for the following:    Prothrombin Time 15.6 (*)    All other components within normal limits  URINALYSIS, ROUTINE W REFLEX MICROSCOPIC - Abnormal; Notable for the  following:    Color, Urine AMBER (*)    APPearance CLOUDY (*)    Bilirubin Urine SMALL (*)    Protein, ur 30 (*)    All other components within normal limits  URINE MICROSCOPIC-ADD ON - Abnormal; Notable for the following:    Bacteria, UA FEW (*)    Casts HYALINE CASTS (*)    All other components within normal limits  CBG MONITORING, ED - Abnormal; Notable for the following:    Glucose-Capillary 145 (*)    All other components within normal limits  I-STAT TROPOININ, ED    Imaging Review No results found.   EKG Interpretation   Date/Time:  Tuesday July 07 2014 11:23:13 EST Ventricular Rate:  115 PR Interval:    QRS Duration: 143 QT Interval:  368 QTC Calculation: 509 R Axis:   -109 Text Interpretation:  Junctional tachycardia vs SR with 1st deg AVB IVCD,  consider atypical RBBB with LAFB Probable anterior infarct, age  indeterminate Lateral leads are  also involved New TWI in I, aVL, V3, V4  Confirmed by DOCHERTY  MD, MEGAN [phone]) on 07/07/2014 11:35:59 AM      MDM   Final diagnoses:  Chest pain  Dyspnea  Esophageal cancer    Pt is a 81 y.o. male with Pmhx as above including metastatic esophageal cancer (has stopped treatment and is in hospice care) who presents with dizziness and fall in his home of unclear cause, possible LOC, thinks he hit his head and also had CP at rest today.. Pt given IVF PTA. On exam, pt tachycardic, dry mucus membranes. Given CP, syncope, active cancer, will get CTA, CT head, basic labs, will give IVF.   CT head negative for acute findings. Trop negative. UA not infected.  Dr. Colin Rhein will f/u on CTA.       Ernestina Patches, MD 07/30/14 [phone]

## 2014-07-30 NOTE — Telephone Encounter (Signed)
Post ED Visit - Positive Culture Follow-up  Culture report reviewed by antimicrobial stewardship pharmacist: []  Wes Dulaney, Pharm.D., BCPS [x]  Heide Guile, Pharm.D., BCPS []  Alycia Rossetti, Pharm.D., BCPS []  [place], Pharm.D., BCPS, AAHIVP []  Legrand Como, Pharm.D., BCPS, AAHIVP []  Isac Sarna, Pharm.D., BCPS  Positive URINE culture E. Coli Treated with cephalexin, organism sensitive to the same and no further patient follow-up is required at this time.  Hazle Nordmann 07/30/2014, 9:15 AM

## 2014-07-31 ENCOUNTER — Encounter: Payer: Self-pay | Admitting: Oncology

## 2014-07-31 ENCOUNTER — Telehealth: Payer: Self-pay | Admitting: *Deleted

## 2014-07-31 NOTE — Telephone Encounter (Signed)
[name] daughter called to say her father is quickly coming to the end of his life.  His son is staying with him and providing 24 hour total care to the patient.  They will bring an FMLA form to [facility] for his son to be completed.

## 2014-07-31 NOTE — Progress Notes (Signed)
I placed fmla forms on desk of nurse for the patient's son. See prev notes.

## 2014-08-03 ENCOUNTER — Encounter: Payer: Self-pay | Admitting: Oncology

## 2014-08-04 ENCOUNTER — Telehealth: Payer: Self-pay | Admitting: *Deleted

## 2014-08-04 NOTE — Telephone Encounter (Signed)
Received message from [place] that pt passed away 2014/08/24 @ 9:22pm.  Dr. Benay Spice made aware.

## 2014-08-05 ENCOUNTER — Telehealth: Payer: Self-pay | Admitting: Oncology

## 2014-08-05 NOTE — Telephone Encounter (Signed)
Received death certificate [birth year]49[birth year]

## 2014-08-05 NOTE — Telephone Encounter (Signed)
Received message from HIM - patient died Aug 12, 2014. Chart marked accordingly.

## 2014-08-07 NOTE — Progress Notes (Signed)
I called [name] [phone] to let her know forms are ready for pick up at front desk(ms. Wilma). I left on her vmail.

## 2014-08-07 DEATH — deceased

## 2014-09-09 ENCOUNTER — Encounter (HOSPITAL_COMMUNITY): Payer: TRICARE For Life (TFL)

## 2015-07-23 ENCOUNTER — Other Ambulatory Visit: Payer: Self-pay | Admitting: Nurse Practitioner

## 2015-09-27 IMAGING — CR DG ABDOMEN ACUTE W/ 1V CHEST
3 series · 3 of 3 positions shown · non-contrast
Comparison: Two-view chest 05/05/2012.

CLINICAL DATA: Constipation. Abdominal pain and shortness of
breath.

EXAM:
ACUTE ABDOMEN SERIES (ABDOMEN 2 VIEW & CHEST 1 VIEW)

[w chest pa]
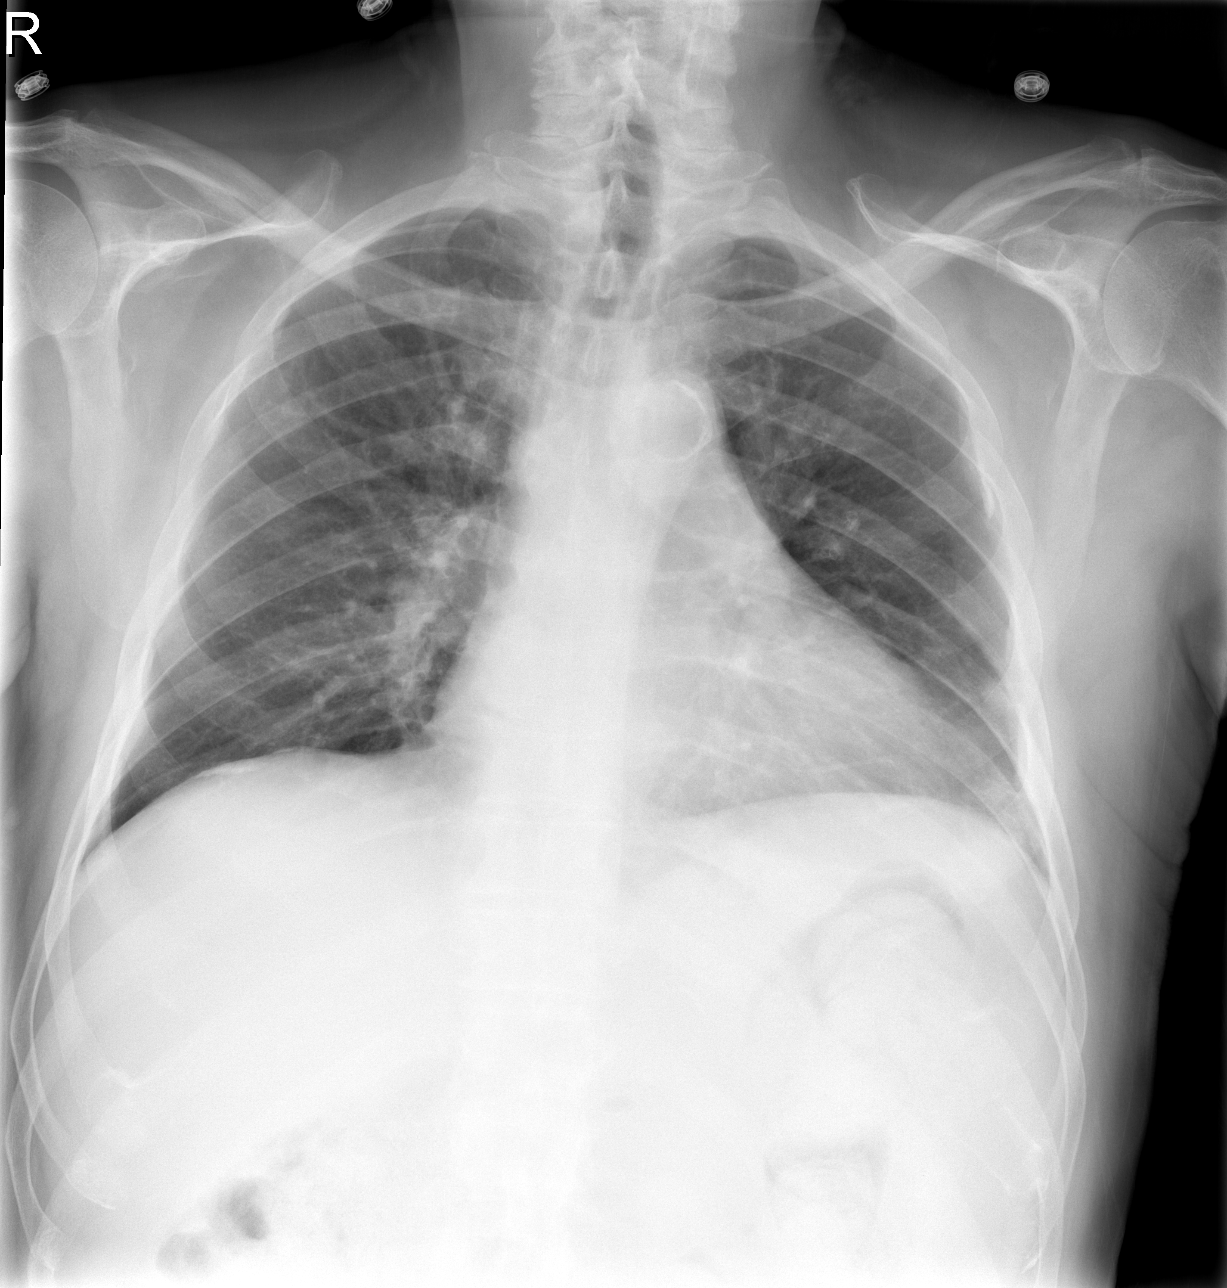

[w abdomen upright]
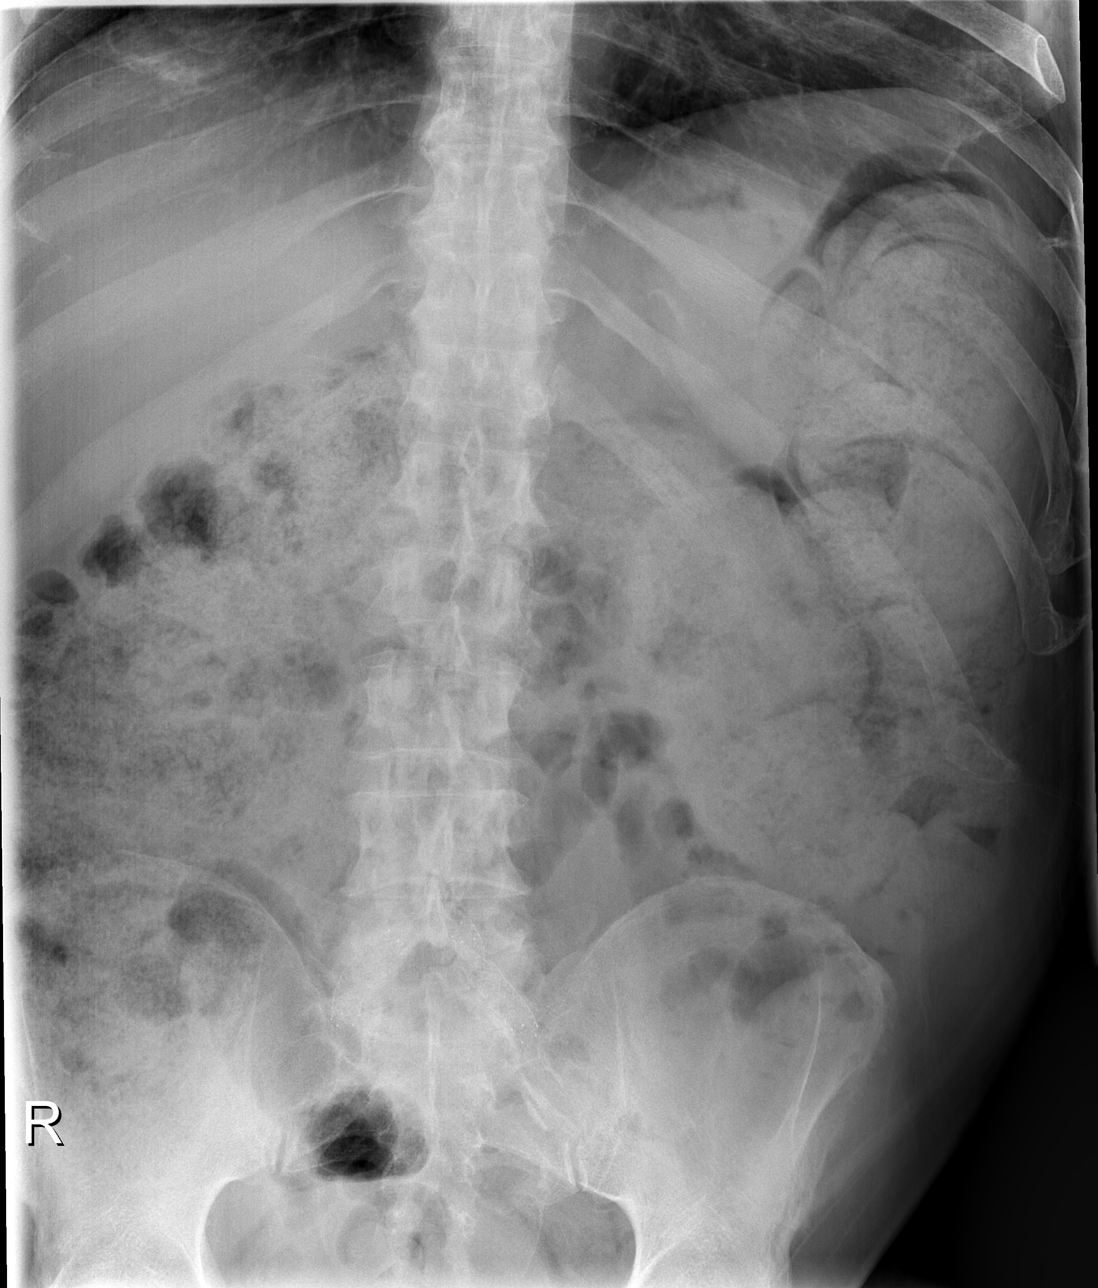

[t abdomen supine]
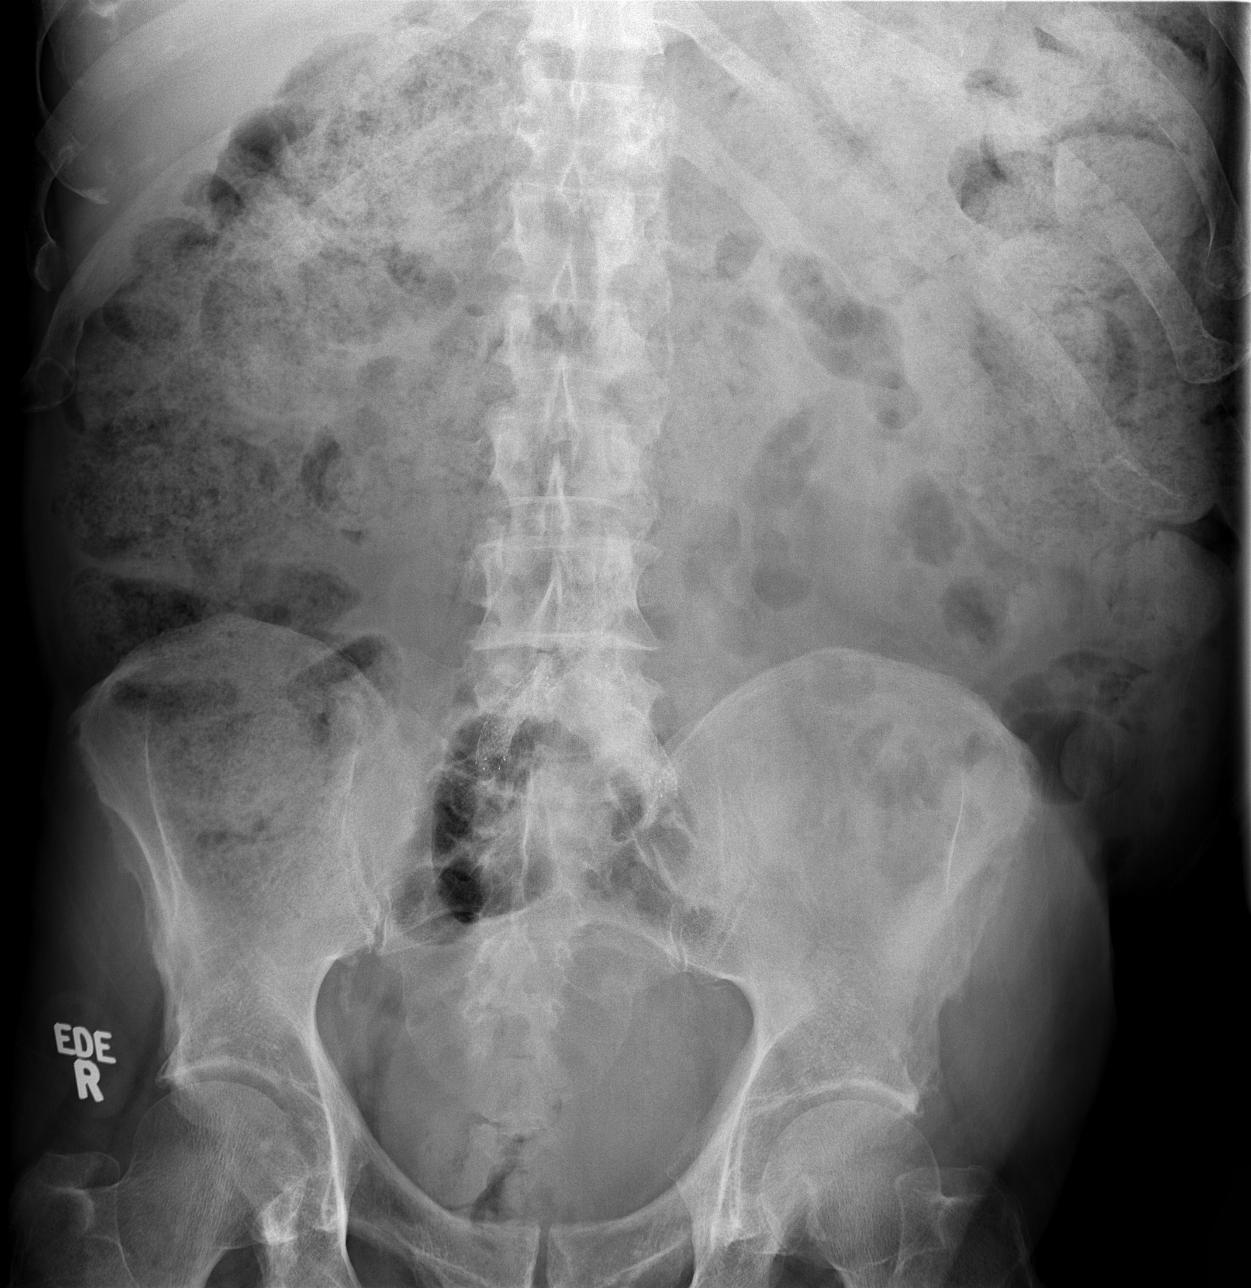

[3 of 3 positions shown; findings below may reference images not displayed]

FINDINGS: The heart size is exaggerated by low lung volumes. Atherosclerotic
calcifications are again noted at the aortic arch. Minimal
atelectasis is present at the lung bases.

A large amount stool is present throughout the colon. This small
bowel is unremarkable. There is some thickening around the distal
rectosigmoid colon.

Proximal iliac stents are present. Degenerative changes in the
lumbar spine are stable.
IMPRESSION: 1. A large amount of stool is present throughout the colon.
2. Apparent wall thickening in the distal rectosigmoid colon. This
raises the possibility of inflammatory bowel disease or possibly
neoplasm with a relative obstruction. Endoscopy or CT may be useful
for further evaluation.
3. Atherosclerosis.
